# Patient Record
Sex: Male | Born: 1950 | Hispanic: No | Marital: Married | Smoking: Former smoker
Health system: Southern US, Community
[De-identification: ages and names within clinical notes are randomized; demographics above are authoritative.]

## PROBLEM LIST (undated history)

## (undated) DIAGNOSIS — R0609 Other forms of dyspnea: Secondary | ICD-10-CM

## (undated) DIAGNOSIS — J439 Emphysema, unspecified: Secondary | ICD-10-CM

## (undated) DIAGNOSIS — I10 Essential (primary) hypertension: Secondary | ICD-10-CM

## (undated) DIAGNOSIS — I350 Nonrheumatic aortic (valve) stenosis: Secondary | ICD-10-CM

## (undated) DIAGNOSIS — Z86010 Personal history of colonic polyps: Secondary | ICD-10-CM

## (undated) DIAGNOSIS — R001 Bradycardia, unspecified: Secondary | ICD-10-CM

## (undated) DIAGNOSIS — M5090 Cervical disc disorder, unspecified, unspecified cervical region: Secondary | ICD-10-CM

## (undated) DIAGNOSIS — R42 Dizziness and giddiness: Secondary | ICD-10-CM

## (undated) DIAGNOSIS — Z87891 Personal history of nicotine dependence: Secondary | ICD-10-CM

## (undated) DIAGNOSIS — I739 Peripheral vascular disease, unspecified: Secondary | ICD-10-CM

## (undated) DIAGNOSIS — G4733 Obstructive sleep apnea (adult) (pediatric): Secondary | ICD-10-CM

## (undated) DIAGNOSIS — I472 Ventricular tachycardia, unspecified: Secondary | ICD-10-CM

## (undated) DIAGNOSIS — I2089 Other forms of angina pectoris: Secondary | ICD-10-CM

## (undated) DIAGNOSIS — G453 Amaurosis fugax: Secondary | ICD-10-CM

## (undated) DIAGNOSIS — I2510 Atherosclerotic heart disease of native coronary artery without angina pectoris: Secondary | ICD-10-CM

## (undated) DIAGNOSIS — I700 Atherosclerosis of aorta: Secondary | ICD-10-CM

## (undated) DIAGNOSIS — R3129 Other microscopic hematuria: Secondary | ICD-10-CM

## (undated) DIAGNOSIS — R809 Proteinuria, unspecified: Secondary | ICD-10-CM

## (undated) DIAGNOSIS — L409 Psoriasis, unspecified: Secondary | ICD-10-CM

## (undated) DIAGNOSIS — E119 Type 2 diabetes mellitus without complications: Secondary | ICD-10-CM

## (undated) DIAGNOSIS — Z7982 Long term (current) use of aspirin: Secondary | ICD-10-CM

## (undated) DIAGNOSIS — N400 Enlarged prostate without lower urinary tract symptoms: Secondary | ICD-10-CM

## (undated) DIAGNOSIS — K219 Gastro-esophageal reflux disease without esophagitis: Secondary | ICD-10-CM

## (undated) DIAGNOSIS — M5136 Other intervertebral disc degeneration, lumbar region: Secondary | ICD-10-CM

## (undated) DIAGNOSIS — D126 Benign neoplasm of colon, unspecified: Secondary | ICD-10-CM

## (undated) DIAGNOSIS — N83201 Unspecified ovarian cyst, right side: Secondary | ICD-10-CM

## (undated) DIAGNOSIS — E785 Hyperlipidemia, unspecified: Secondary | ICD-10-CM

## (undated) DIAGNOSIS — M51369 Other intervertebral disc degeneration, lumbar region without mention of lumbar back pain or lower extremity pain: Secondary | ICD-10-CM

## (undated) DIAGNOSIS — M1990 Unspecified osteoarthritis, unspecified site: Secondary | ICD-10-CM

## (undated) DIAGNOSIS — K649 Unspecified hemorrhoids: Secondary | ICD-10-CM

## (undated) DIAGNOSIS — R0600 Dyspnea, unspecified: Secondary | ICD-10-CM

## (undated) DIAGNOSIS — G4730 Sleep apnea, unspecified: Secondary | ICD-10-CM

## (undated) DIAGNOSIS — I493 Ventricular premature depolarization: Secondary | ICD-10-CM

## (undated) DIAGNOSIS — D649 Anemia, unspecified: Secondary | ICD-10-CM

## (undated) DIAGNOSIS — J449 Chronic obstructive pulmonary disease, unspecified: Secondary | ICD-10-CM

## (undated) HISTORY — PX: JOINT REPLACEMENT: SHX530

## (undated) HISTORY — PX: TOOTH EXTRACTION: SUR596

## (undated) HISTORY — PX: BACK SURGERY: SHX140

## (undated) HISTORY — DX: Nonrheumatic aortic (valve) stenosis: I35.0

## (undated) HISTORY — DX: Ventricular tachycardia, unspecified: I47.20

---

## 2005-09-13 ENCOUNTER — Ambulatory Visit: Payer: Self-pay | Admitting: Internal Medicine

## 2005-09-13 IMAGING — CT CT ABDOMEN AND PELVIS WITHOUT AND WITH CONTRAST
3 of 5 series · 12 of 32 positions shown, 17 images · non-contrast
Comparison: none

REASON FOR EXAM: Hematuria  Allergic to Shellfish office is medicating
COMMENTS:

[Series 2: without · axial · non-contrast · 0.83mm/px · z∈[-929,-585]mm · 6 of 61 slices shown, 11 images]
[im 9/61  soft-tissue]
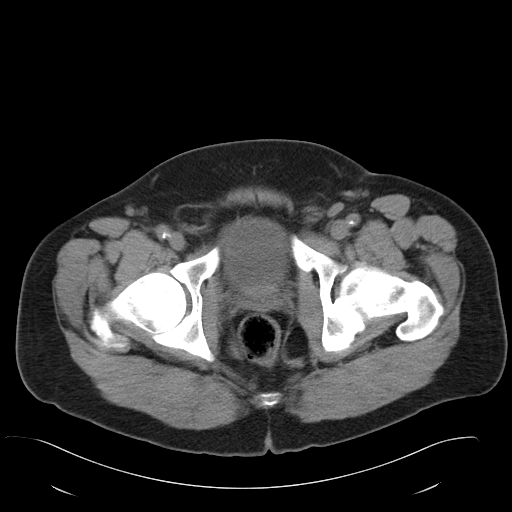
[im 9/61  bone]
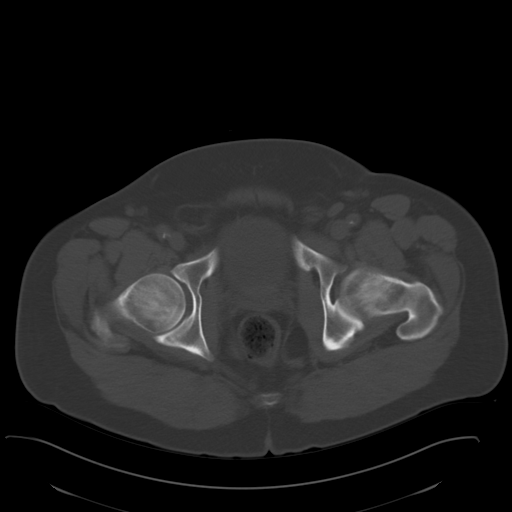
[im 18/61  soft-tissue]
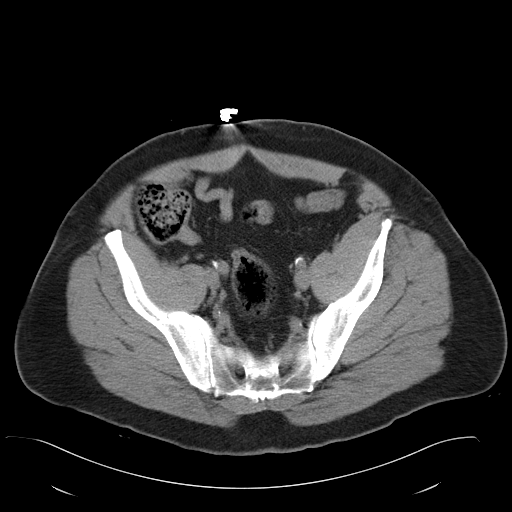
[im 26/61  soft-tissue]
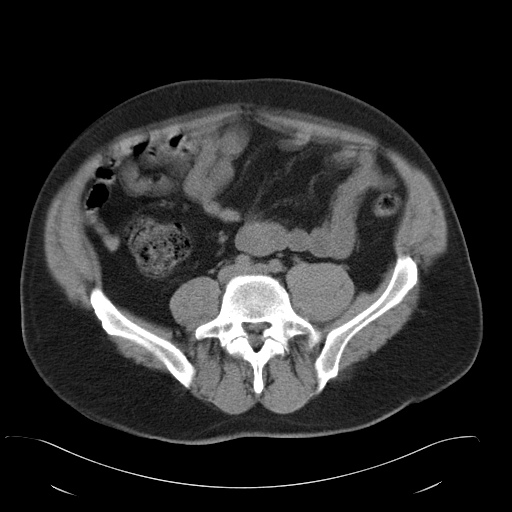
[im 26/61  lung]
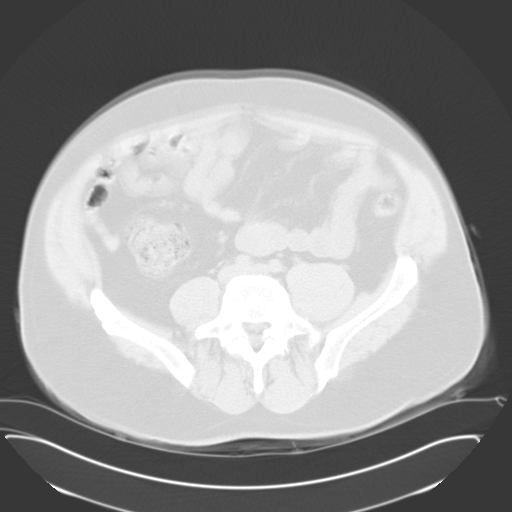
[im 35/61  soft-tissue]
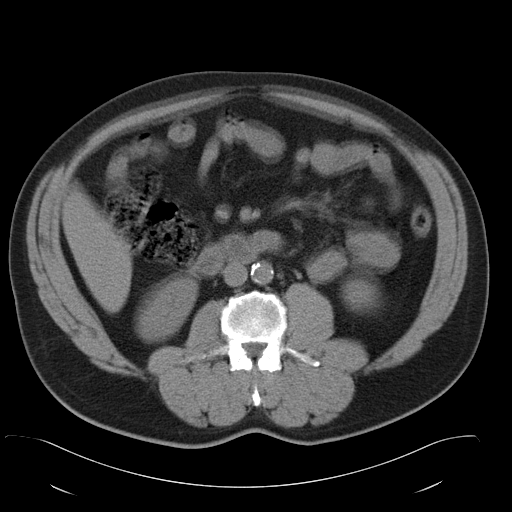
[im 35/61  lung]
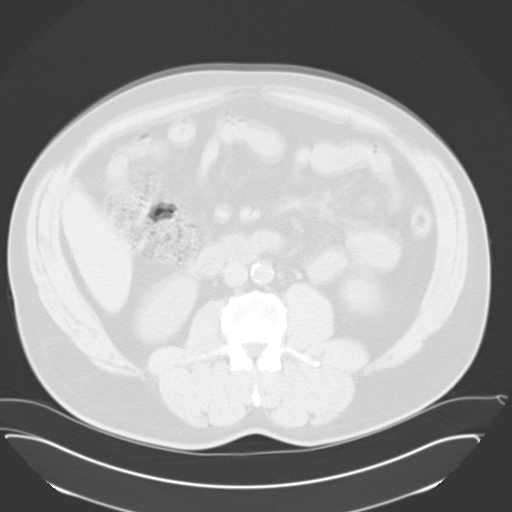
[im 43/61  soft-tissue]
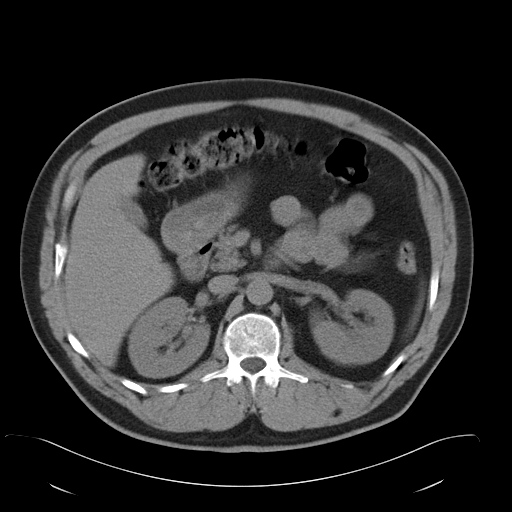
[im 43/61  lung]
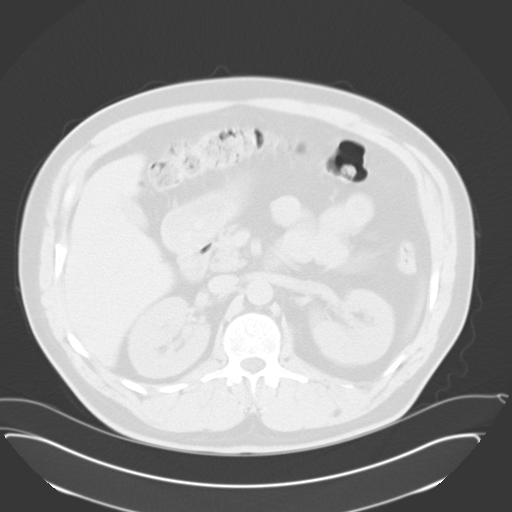
[im 52/61  soft-tissue]
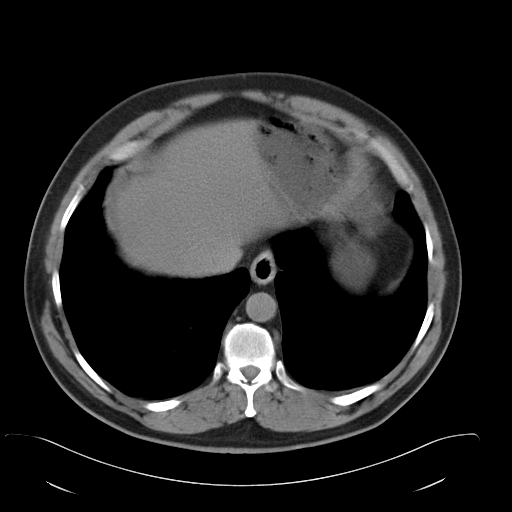
[im 52/61  lung]
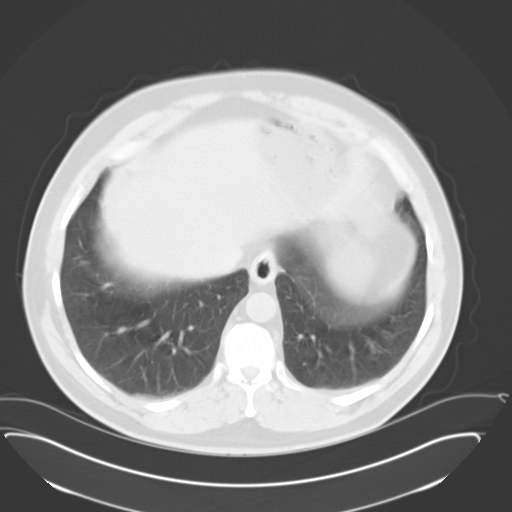

[Series 3: with · axial · 0.83mm/px · z∈[-913,-593]mm · 5 of 61 slices shown]
[im 11/61  soft-tissue]
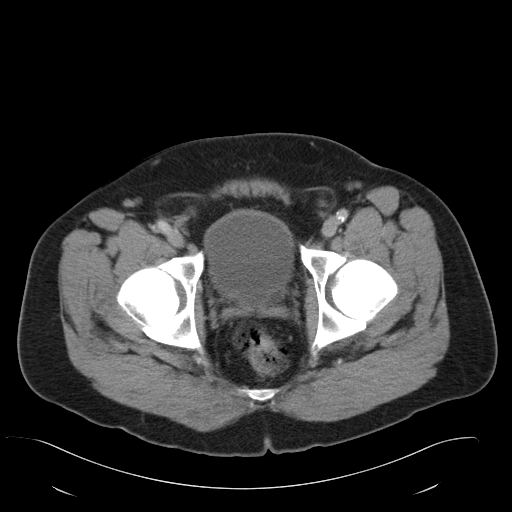
[im 21/61  soft-tissue]
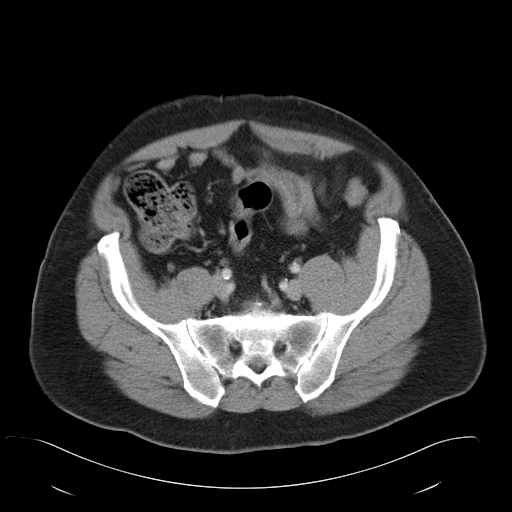
[im 31/61  soft-tissue]
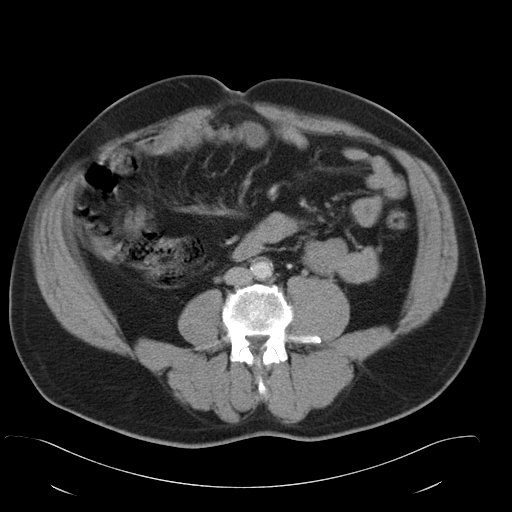
[im 41/61  soft-tissue]
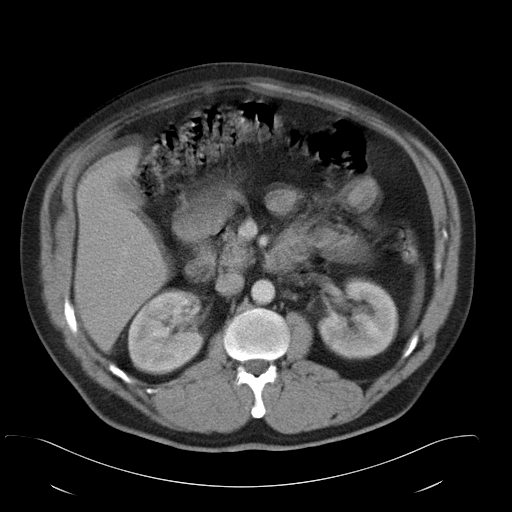
[im 51/61  soft-tissue]
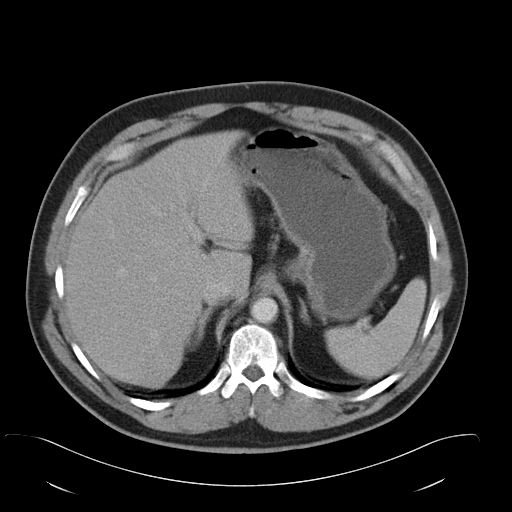

[Series 5: with repeat · axial · 0.83mm/px · 1 of 61 slices shown]
[im 11/61  soft-tissue]
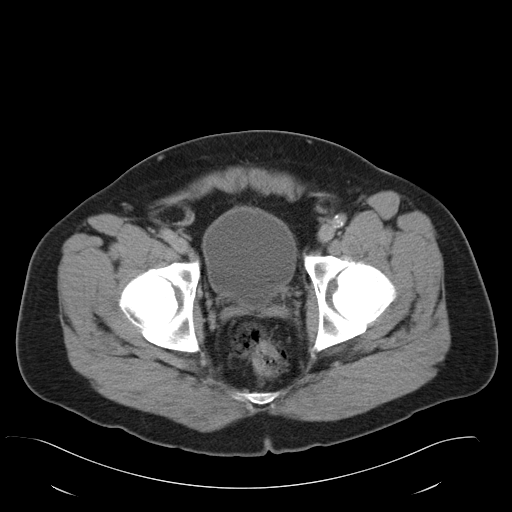

[12 of 32 positions shown; findings below may reference images not displayed]

PROCEDURE:     CT  - CT ABDOMEN / PELVIS  W/WO  - [DATE]  [DATE]

RESULT:       The patient has unexplained hematuria.  The patient underwent
a premedication protocol for this study.  The patient is also noting history
of pelvic discomfort.

On the precontrast images, the kidneys exhibit normal density and contour.
I do not see evidence of collecting system stones.  A punctate calcification
in the LEFT midpole is seen on image 18.  Following intravenous
administration of 100 ml of [IF] the kidneys enhance well.  There is a
cortically based hypodensity involving the mid to lower pole of the LEFT
kidney anteriorly.  It measures less than 1.0 cm in greatest dimension and
has Hounsfield measurements of approximately 16 on the precontrast images
following to 1 on the immediate postcontrast images and rising to 7 on
delayed images. This is most compatible with a benign cyst.  On the LEFT
there is a hypodensity in the lower pole measuring approximately 8 mm in
diameter with Hounsfield measurement of approximately 14 precontrast, rising
to 23 postcontrast and rising further to approximately 50 on the delayed
images.

The study is limited due to considerable motion artifact and repeat images
were obtained.  The liver, stomach, spleen, gallbladder, adrenal glands and
pancreas are normal in appearance. The spleen is not enlarged.  The
paraaortic and pericaval regions are normal in appearance.  The caliber of
the abdominal aorta is normal.  There is some calcification within its wall.
 The unopacified loops of small and large bowel are normal in appearance.
The urinary bladder is partially distended and is grossly normal.  There is
mild increase in the size of the prostate gland producing an impression upon
the urinary bladder base.  There are small bilateral inguinal hernias
containing peritoneal fat. No bowel is seen within them. There is no free
fluid in the pelvis.  The lung bases exhibit no acute abnormality.
IMPRESSION: 1.     There are findings in the RIGHT kidney most compatible with a benign
simple cyst in the mid to lower pole anteriorly.
2.     On the LEFT in the lower pole of the kidney there is an indeterminate
8 mm diameter focus of decreased density with Hounsfield measurements that
do not fit those classic for a simple cyst.  Further evaluation of the LEFT
kidney with attention to the lower pole is recommended using ultrasound.
3.     I do not see evidence of acute hepatobiliary disease.
4.     There is mild enlargement of the prostate gland.  The urinary bladder
is grossly normal.  On the delayed images there is fractional visualization
of both ureters.

Thank you for this opportunity to contribute to the care of your patient.

Addendum [DATE]:  In the body of the report there was a statement
indicating there was a cortically based hypodensity involving the mid to
lower pole of the LEFT kidney anteriorly.  In fact this should have stated
RIGHT kidney.  In the impression of this dictation, however, the correct
side was indicated.  The finding spoken of here is indeed on the RIGHT.

## 2005-10-11 ENCOUNTER — Ambulatory Visit: Payer: Self-pay | Admitting: Urology

## 2005-10-11 IMAGING — US US RENAL KIDNEY
1 series · 17 of 25 positions shown · non-contrast
Comparison: none

REASON FOR EXAM: Left indeterminate 8.0 mm density as seen on CT of
[DATE]
COMMENTS:

[Series 1: us renal kidney · 17 of 54 slices shown]
[im 1/54]
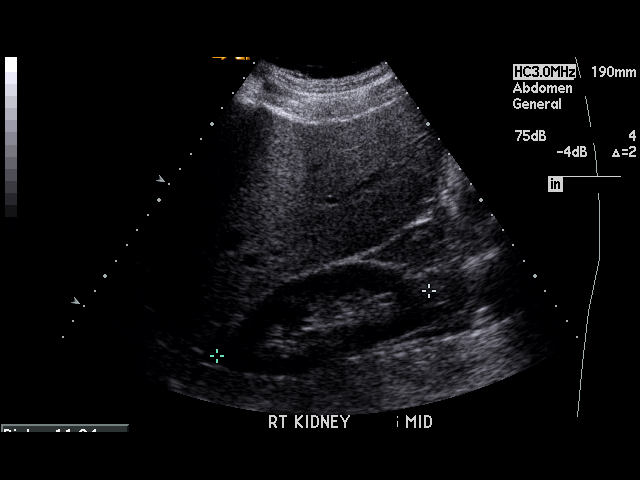
[im 5/54]
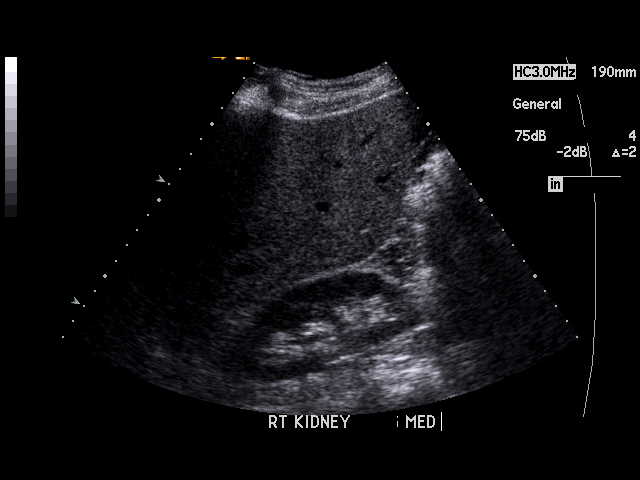
[im 7/54]
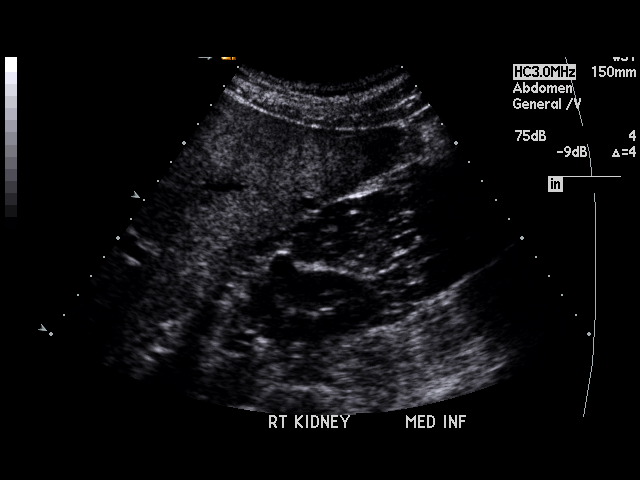
[im 12/54]
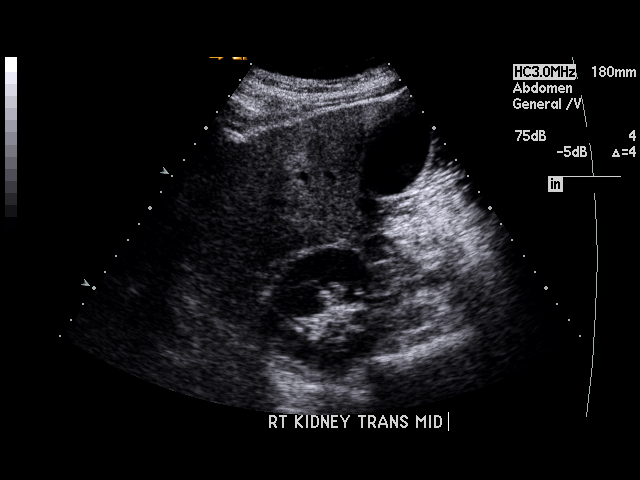
[im 14/54]
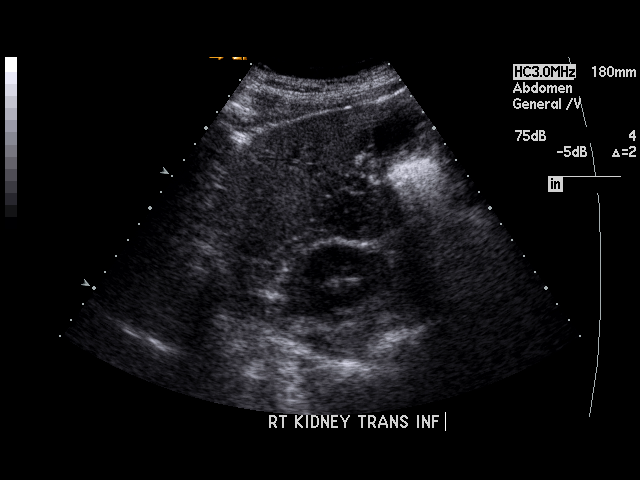
[im 18/54]
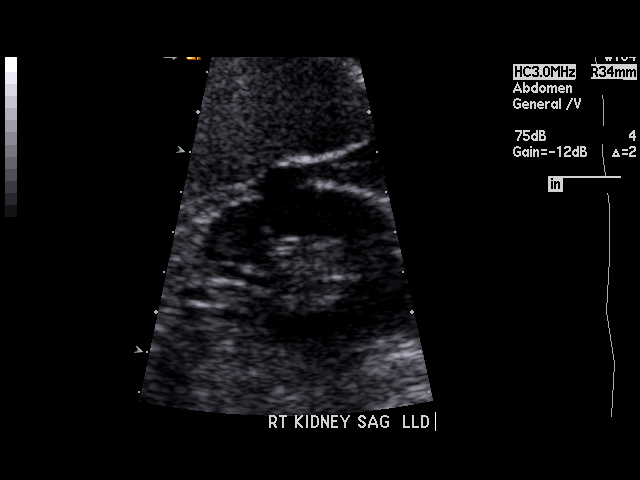
[im 20/54]
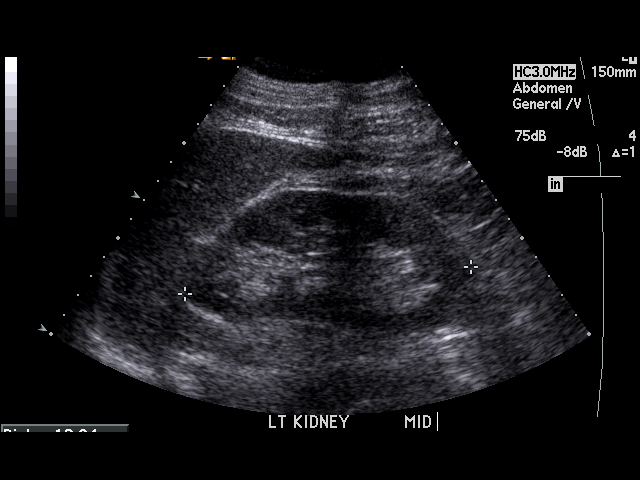
[im 25/54]
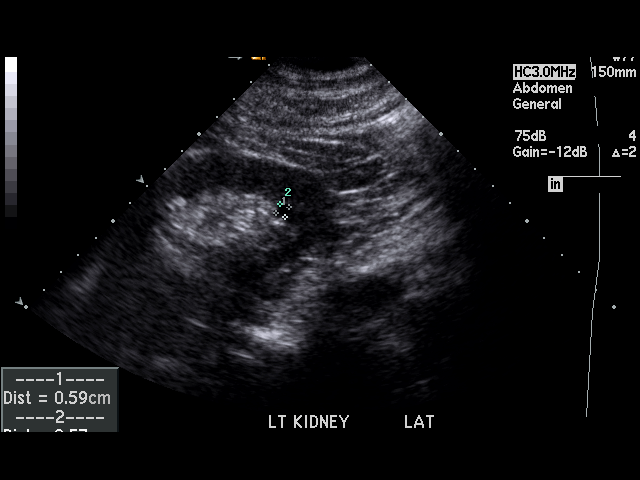
[im 27/54]
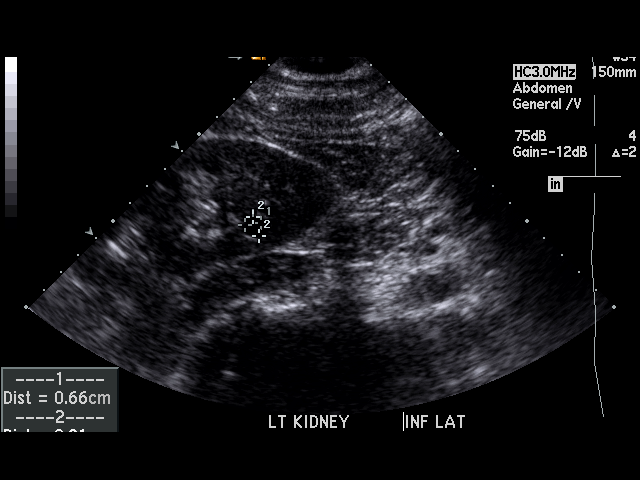
[im 29/54]
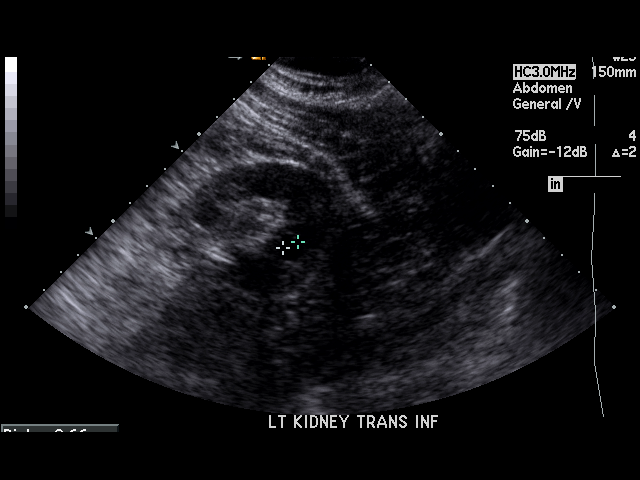
[im 34/54]
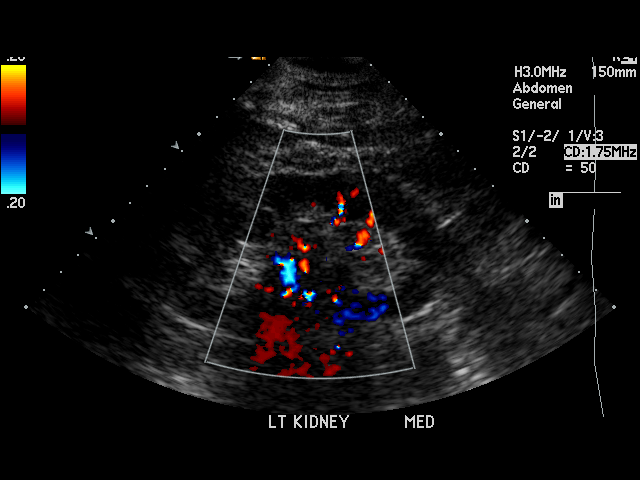
[im 36/54]
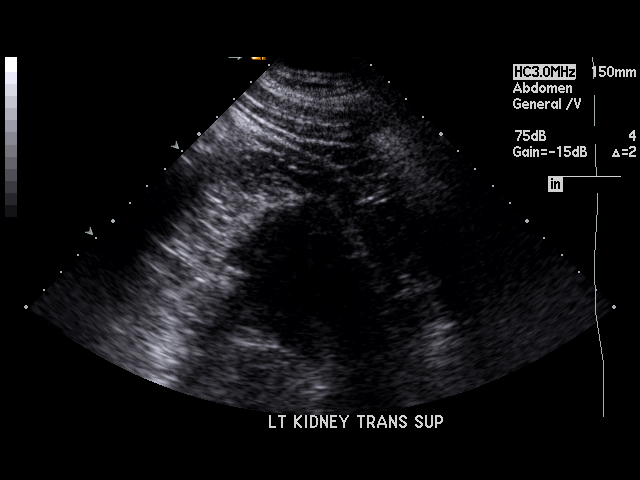
[im 40/54]
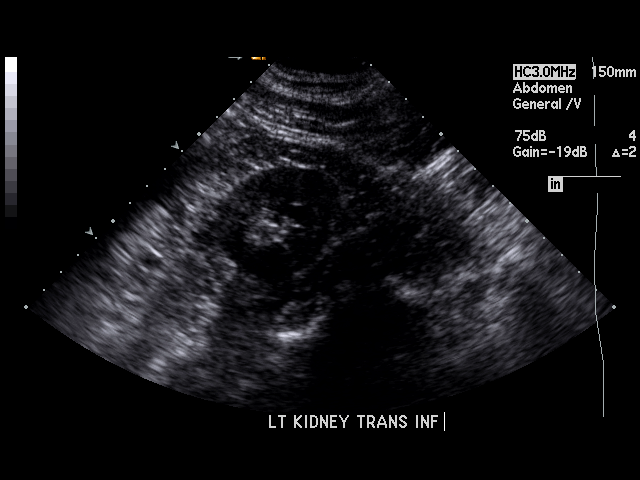
[im 42/54]
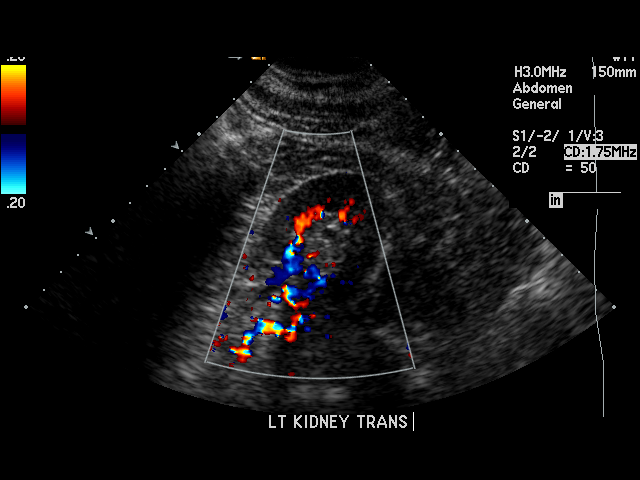
[im 47/54]
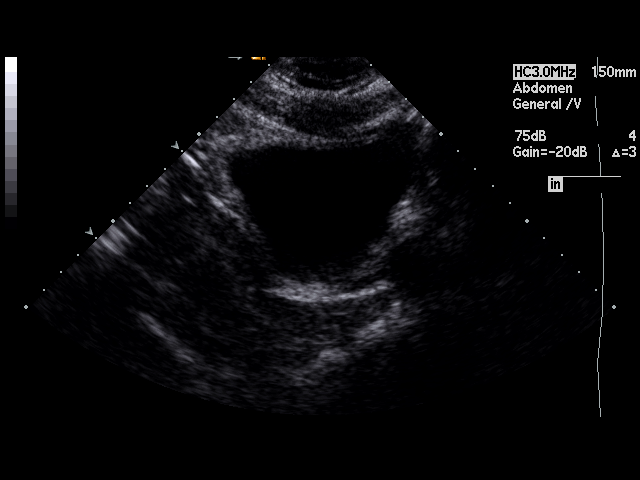
[im 49/54]
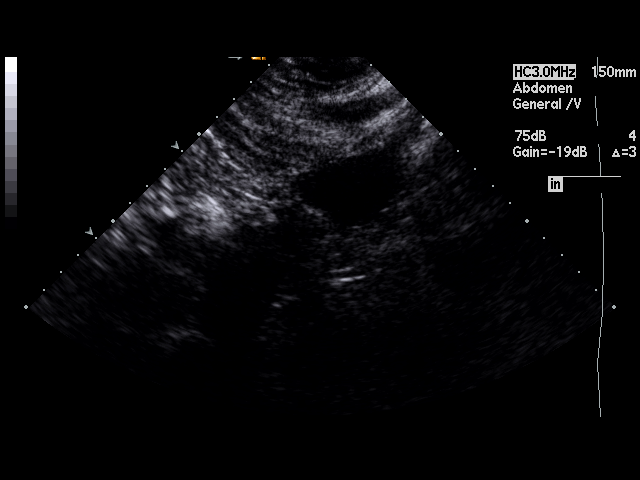
[im 54/54]
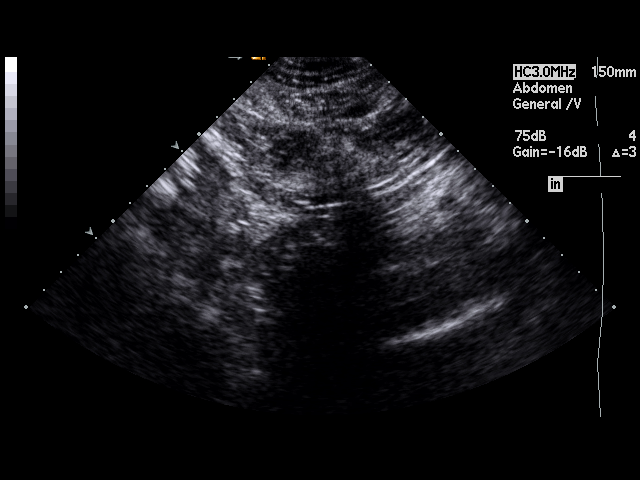

[17 of 25 positions shown; findings below may reference images not displayed]

PROCEDURE:     US  - US KIDNEY BILATERAL  - [DATE] [DATE]

RESULT:     Sonographic evaluation of the kidneys is performed. The patient
has a previous CT but no prior ultrasound examinations for comparison. The
CT suggested a RIGHT renal cyst. On today's study, in the mid to lower pole
RIGHT renal region, there is a somewhat exophytic, hypoechoic area which
measures approximately 9.0 x 8.0 x 9.5 mm. This is hypoechoic but does not
show definite through transmission. The Hounsfield readings on this
suggested that it was cystic but continued follow-up is recommended to
exclude the possibility of a developing solid, hypoechoic mass.

The tiny, low density area in the lower pole of the LEFT kidney, which
appeared to enhance on the delayed images on the CT, is evaluated. There
actually appear to be two, hypoechoic areas in the lower pole of the LEFT
kidney on ultrasound. One of these areas measures 5.9 x 5.7 x 5.0 mm and the
second is inferolateral measuring 6.6 x 8.1 6.6 mm and does show through
transmission. The kidneys otherwise appear to be grossly normal with the
RIGHT kidney measuring 11.9 x 5.2 x 5.6 cm. The LEFT kidney measures 12.3 x
6.6 x 4.9 cm. There is urine in the bladder.
IMPRESSION: 1.     Indeterminate hypoechoic lesion in the RIGHT kidney. This may
represent a cyst without through transmission or a hypoechoic solid lesion.
2.     Tiny lesion in the lower pole of the LEFT kidney is too small for
characterization. The second appears to show through transmission suggestive
of a cyst. Continued follow-up is recommended to document stability.

## 2006-07-25 ENCOUNTER — Ambulatory Visit: Payer: Self-pay | Admitting: Gastroenterology

## 2007-02-27 ENCOUNTER — Ambulatory Visit: Payer: Self-pay | Admitting: Urology

## 2007-03-13 ENCOUNTER — Ambulatory Visit: Payer: Self-pay | Admitting: Urology

## 2007-03-13 IMAGING — CT CT ABDOMEN WO/W CM
2 of 4 series · 14 of 32 positions shown, 19 images · non-contrast
Comparison: none

REASON FOR EXAM: triphasic   renal lesion   shellfish allergy  on
Metformin  Dr to instruct
COMMENTS:

PROCEDURE:     CT  - CT ABDOMEN STANDARD W/WO  - [DATE] [DATE]
RESULT:
HISTORY: Renal lesion.

[Series 2: wo · axial · 0.87mm/px · z∈[-256,-46]mm · 7 of 57 slices shown, 12 images]
[im 8/57  soft-tissue]
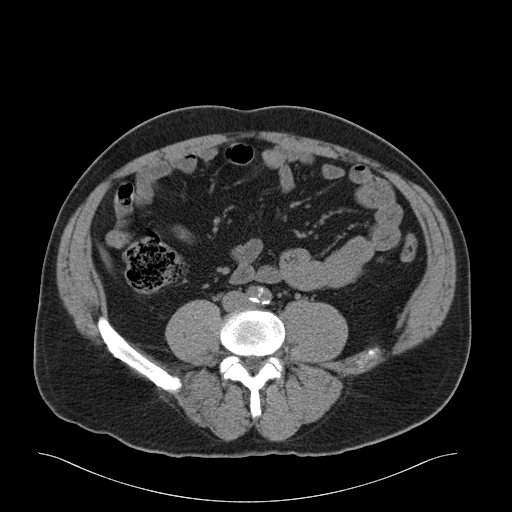
[im 8/57  bone]
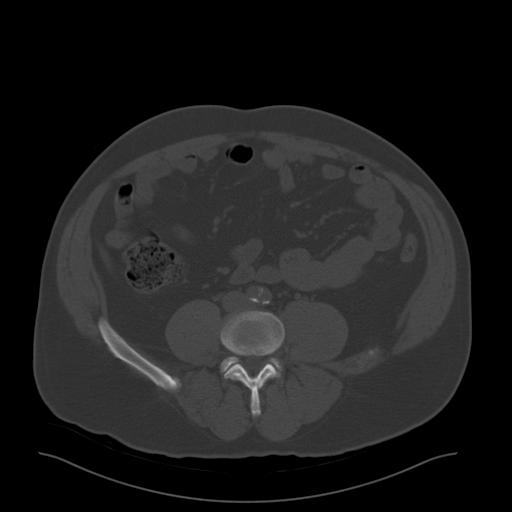
[im 15/57  soft-tissue]
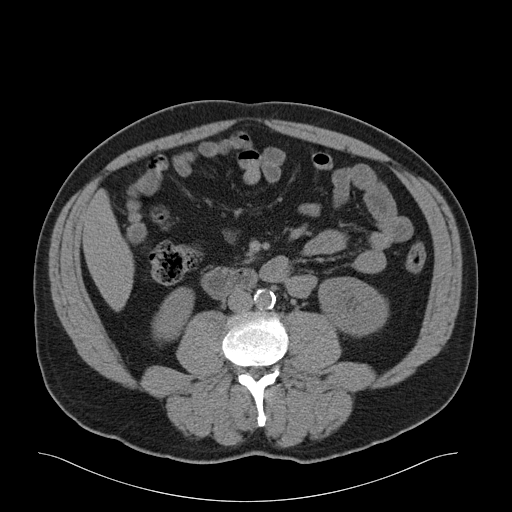
[im 22/57  soft-tissue]
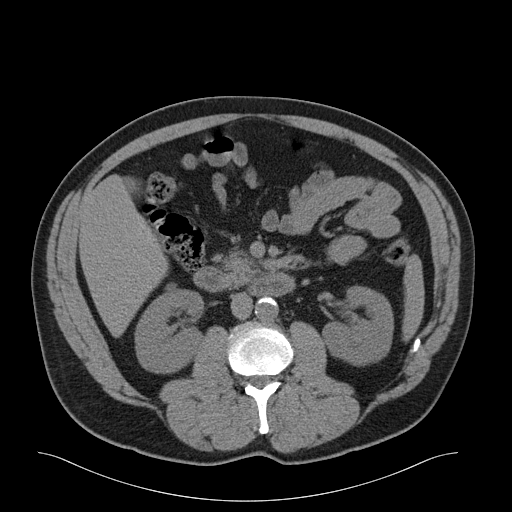
[im 29/57  soft-tissue]
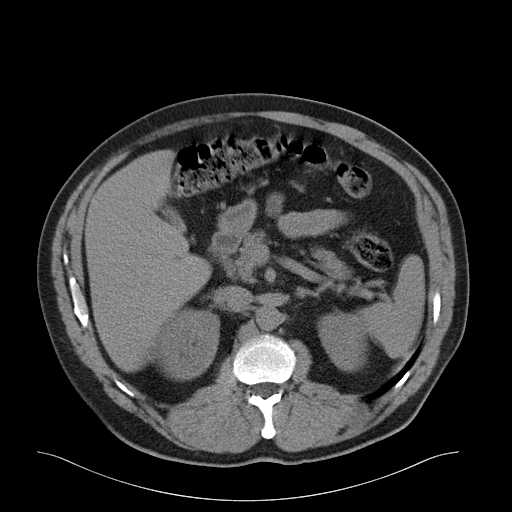
[im 29/57  lung]
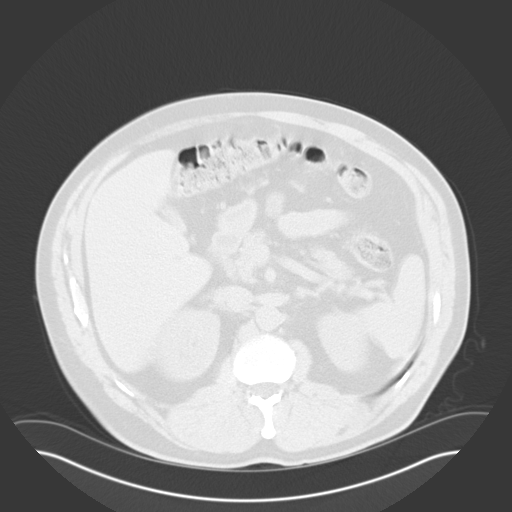
[im 36/57  soft-tissue]
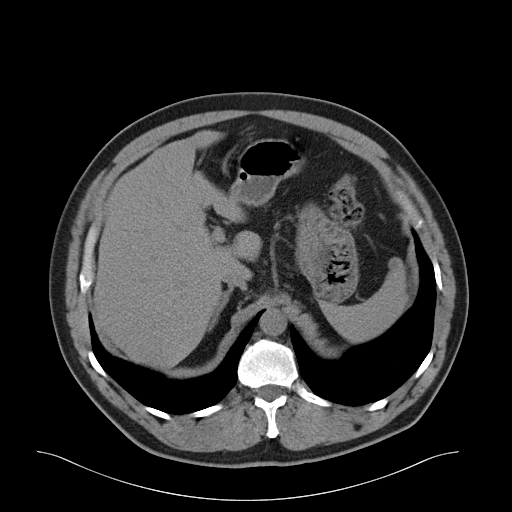
[im 36/57  lung]
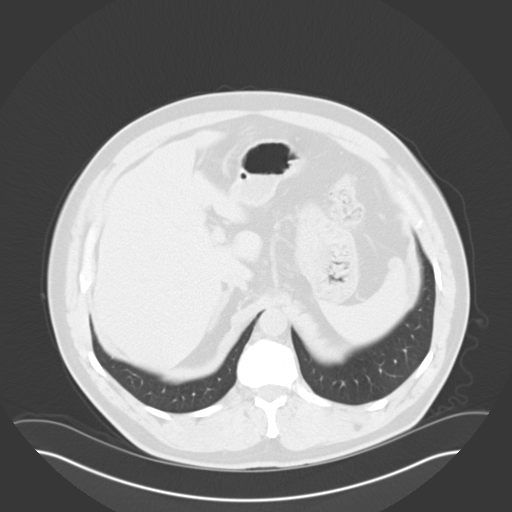
[im 43/57  soft-tissue]
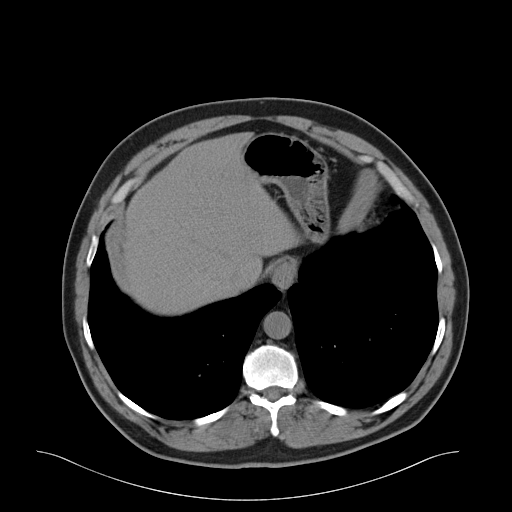
[im 43/57  lung]
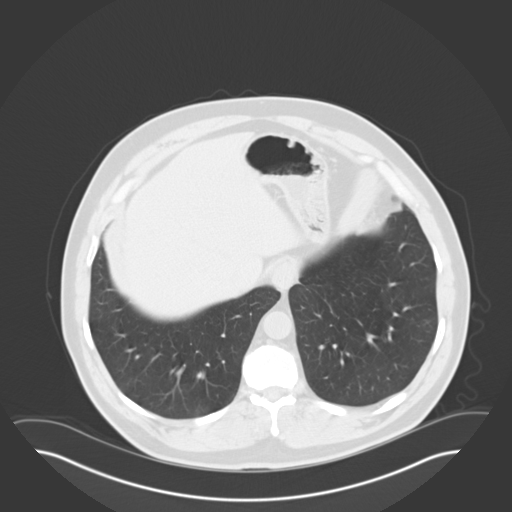
[im 50/57  soft-tissue]
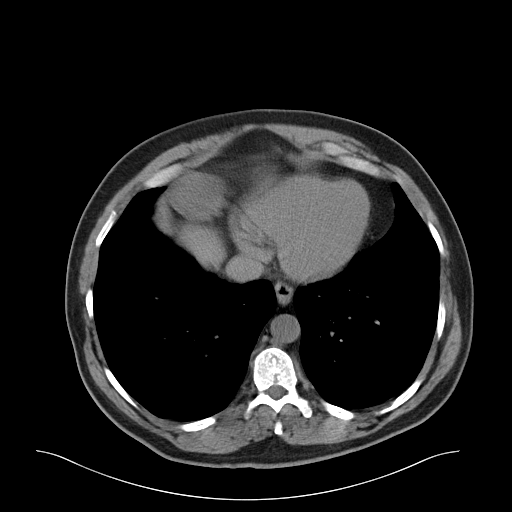
[im 50/57  lung]
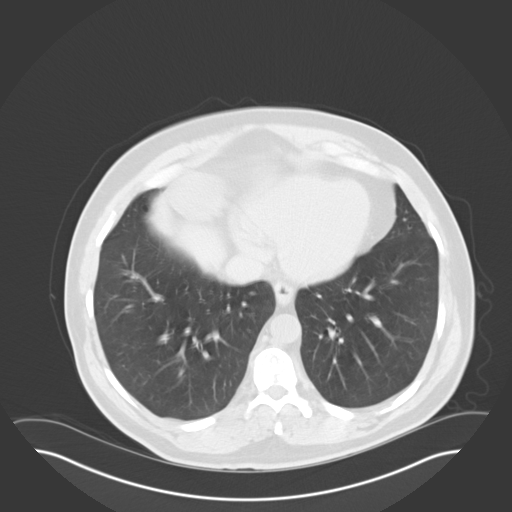

[Series 3: with · axial · 0.87mm/px · z∈[-256,-46]mm · 7 of 57 slices shown]
[im 8/57  soft-tissue]
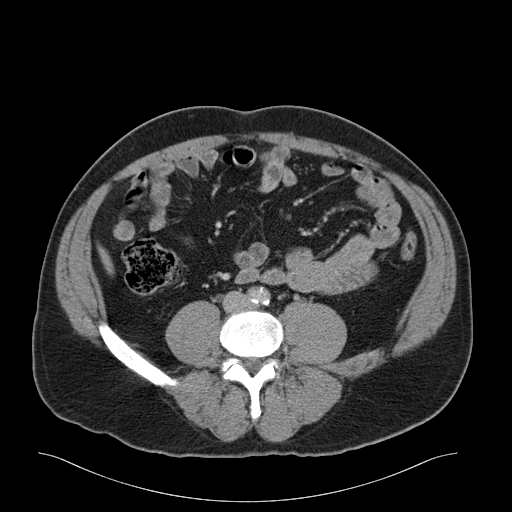
[im 15/57  soft-tissue]
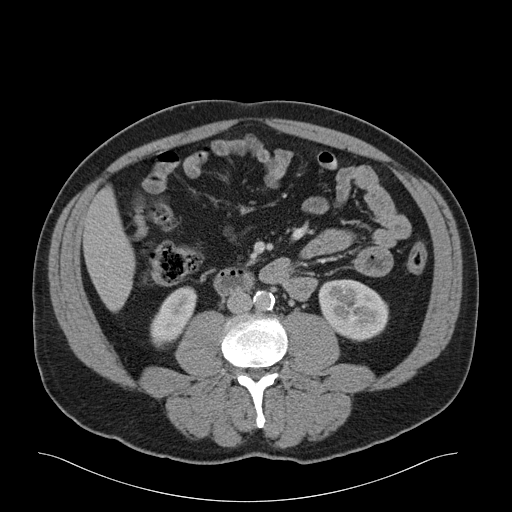
[im 22/57  soft-tissue]
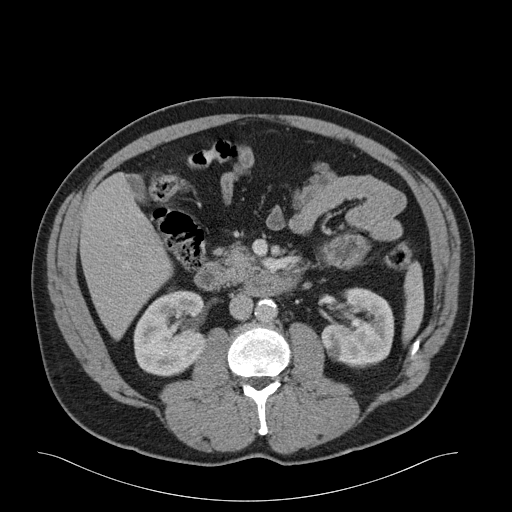
[im 29/57  soft-tissue]
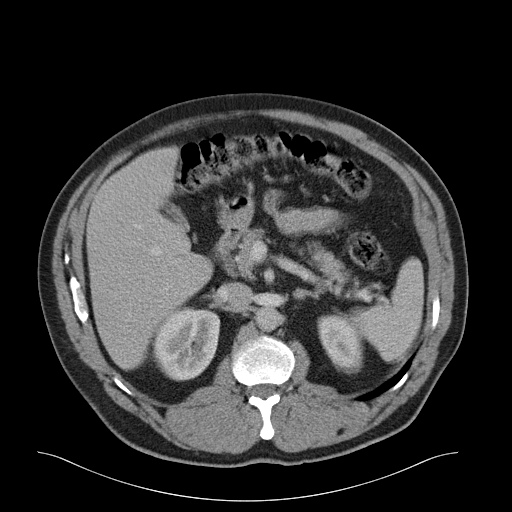
[im 36/57  soft-tissue]
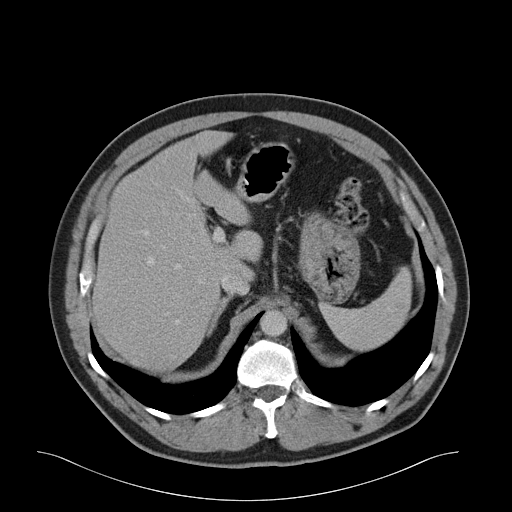
[im 43/57  soft-tissue]
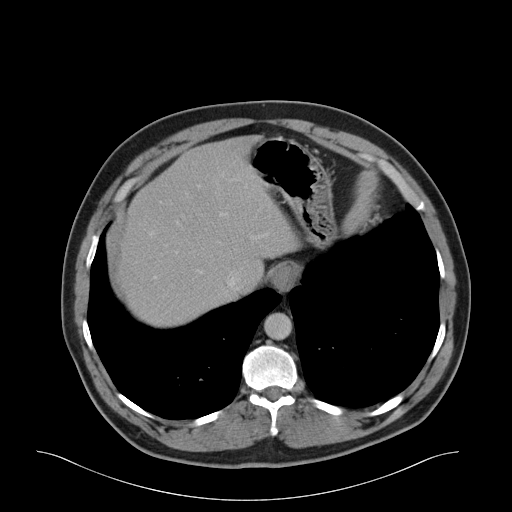
[im 50/57  soft-tissue]
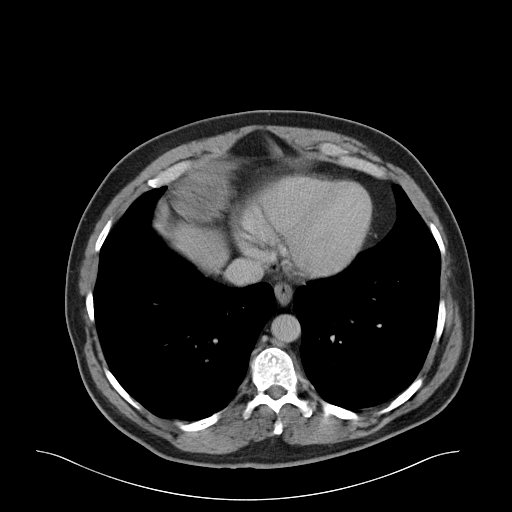

[14 of 32 positions shown; findings below may reference images not displayed]

COMPARISON STUDIES: Prior CT of the abdomen dated [DATE].

PROCEDURE AND FINDINGS: Standard triphasic CT of the abdomen was obtained.
The previously identified low density lesions in the LEFT kidney on today's
exam are demonstrated to represent simple cysts.  All Hounsfield units are
less than 20 even on contrast enhanced studies. A simple cyst is noted in
the RIGHT kidney. No further evaluation is now needed given stability over
one year and the fact that the lesions appear to represent simple cysts. The
liver and spleen is normal. The pancreas is normal. The adrenals are normal.
No focal renal abnormalities otherwise are noted. There is no hydronephrosis
or hydroureter. No bowel distention is noted.
IMPRESSION: 1)Stable exam. Previously identified lesions in both kidneys are now noted
to represent simple cysts as all lesions have Hounsfield units less than 20.
 The lesions do not appear to have changed from prior study. No further
evaluation of the kidneys in regard to the renal lesions is needed.

## 2008-06-10 ENCOUNTER — Inpatient Hospital Stay: Payer: Self-pay | Admitting: Internal Medicine

## 2008-06-10 IMAGING — CR DG CHEST 1V PORT
1 series · 1 of 1 positions shown · non-contrast
Comparison: none

REASON FOR EXAM: fever and hypoxia
COMMENTS:

PROCEDURE:     DXR - DXR PORTABLE CHEST SINGLE VIEW  - [DATE]  [DATE]
RESULT:     The lungs are clear of acute infiltrates. The cardiovascular
structures are unremarkable.

[view not recorded]
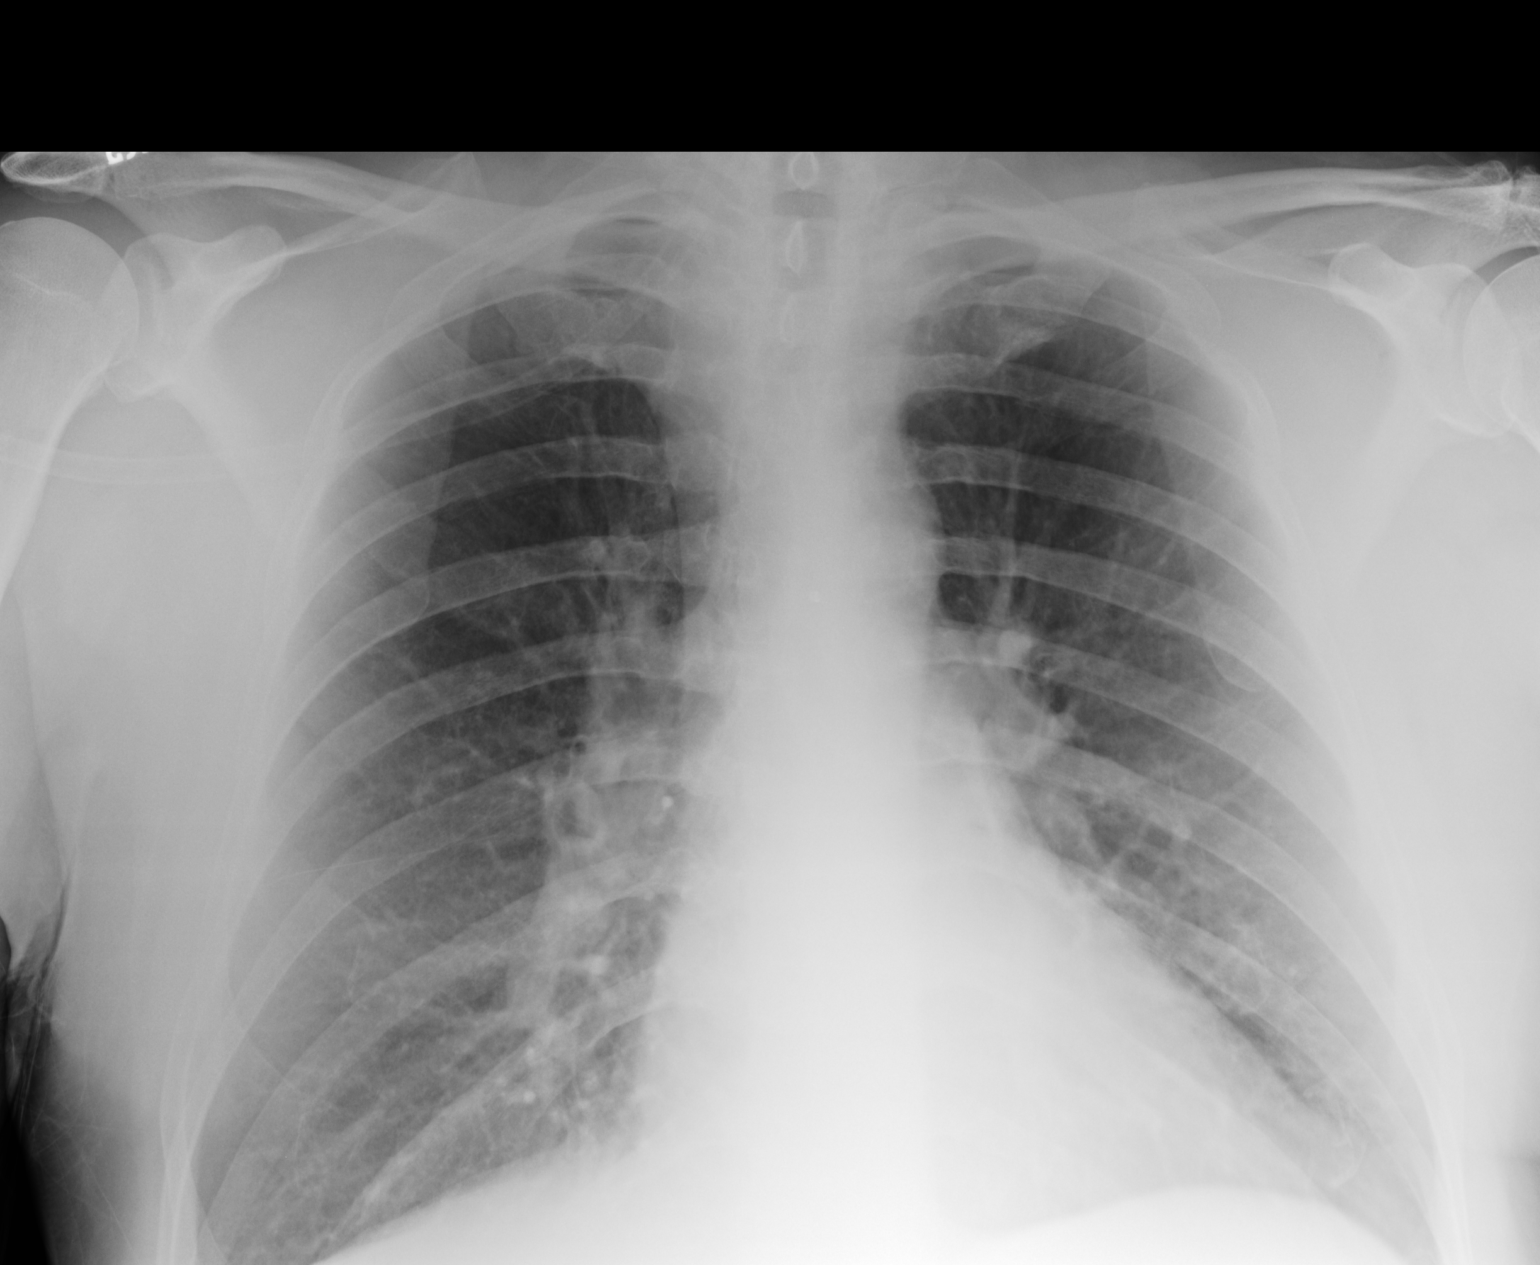

[1 of 1 positions shown; findings below may reference images not displayed]

IMPRESSION: No acute cardiopulmonary disease.

## 2008-06-11 ENCOUNTER — Ambulatory Visit: Payer: Self-pay | Admitting: Cardiology

## 2008-06-11 IMAGING — CR DG CHEST 2V
1 series · 4 of 4 positions shown · non-contrast
Comparison: none

REASON FOR EXAM: sob
COMMENTS:

[Series 1: view not recorded · 0.17mm/px · 4 of 4 slices shown]
[im 1/4]
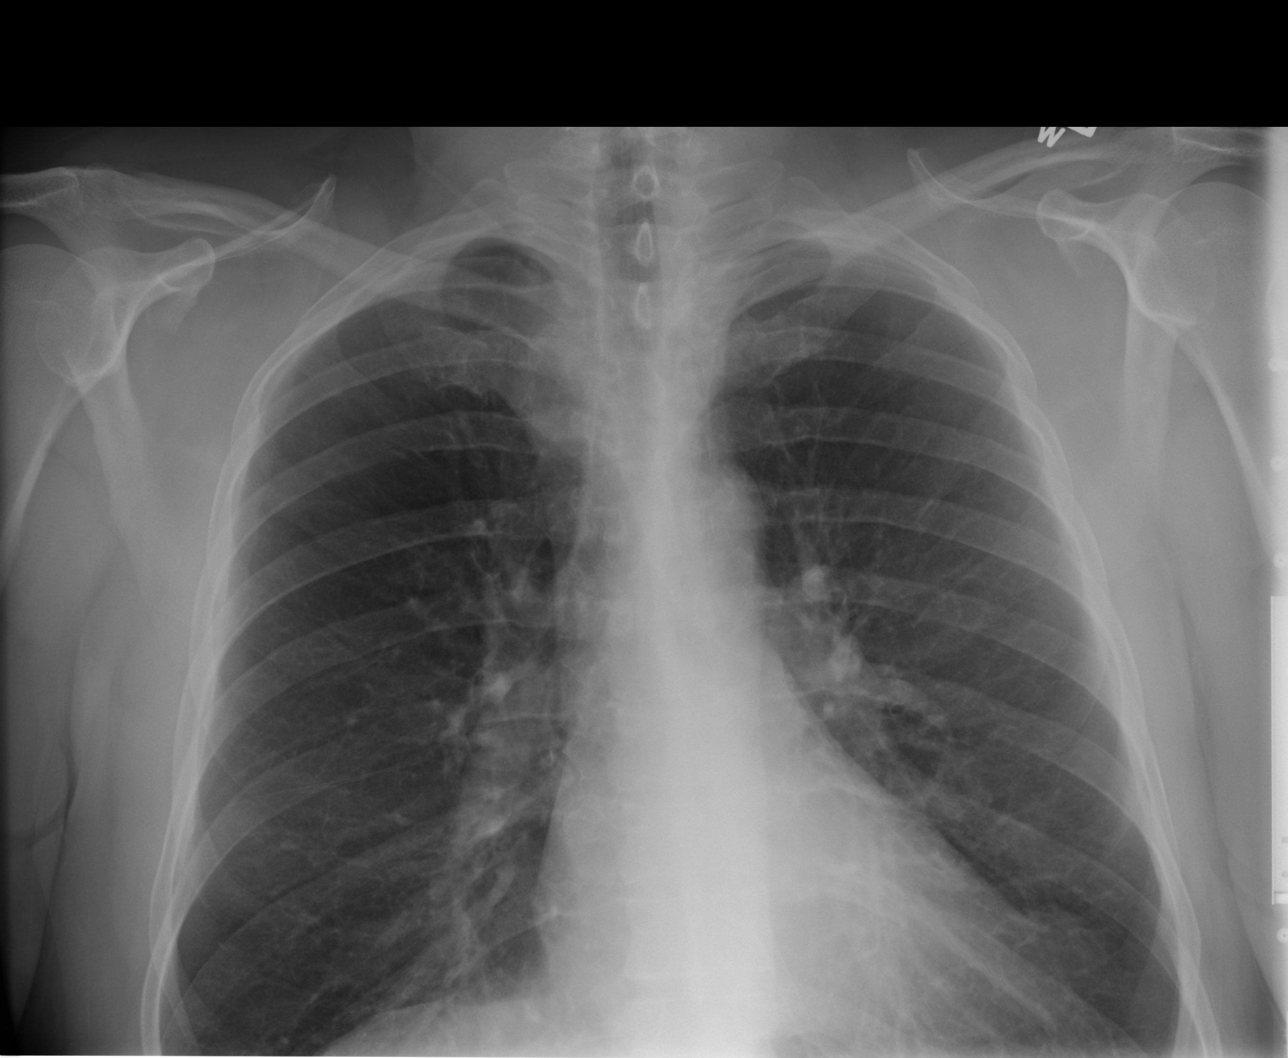
[im 2/4]
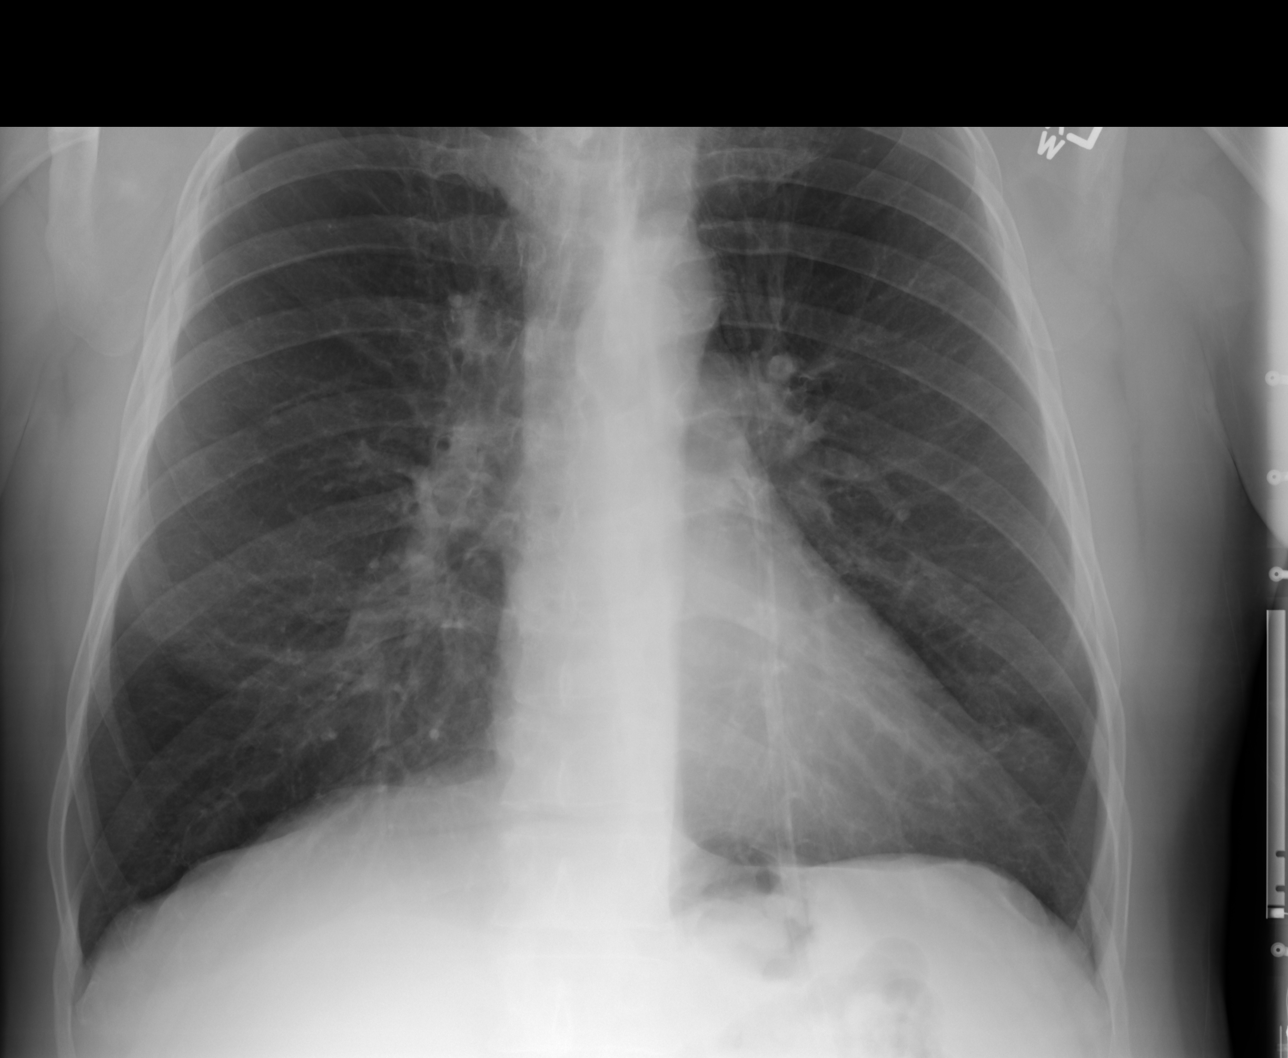
[im 3/4]
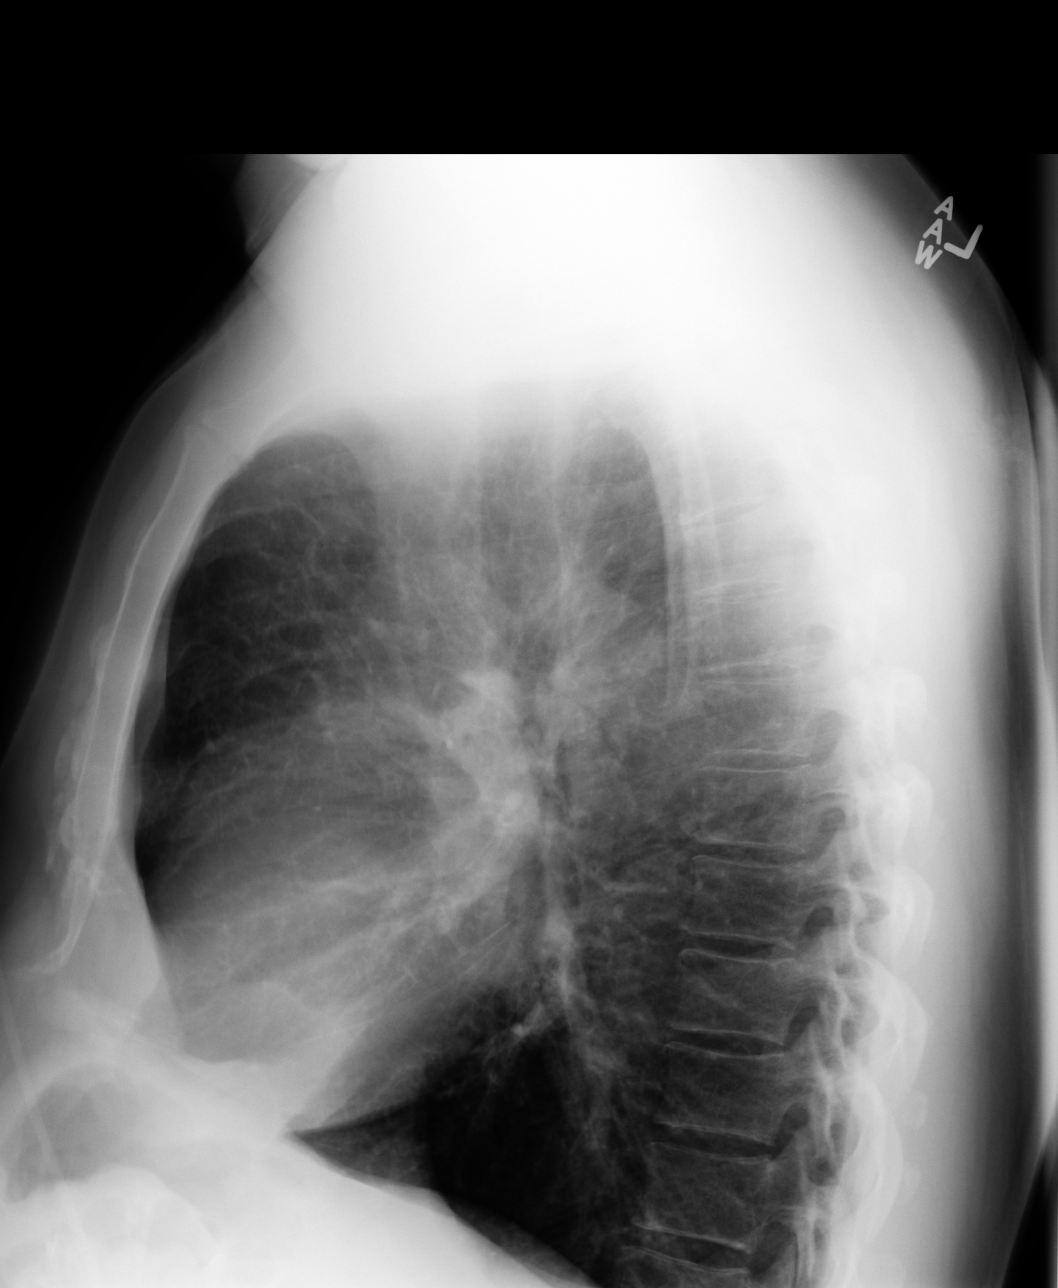
[im 4/4]
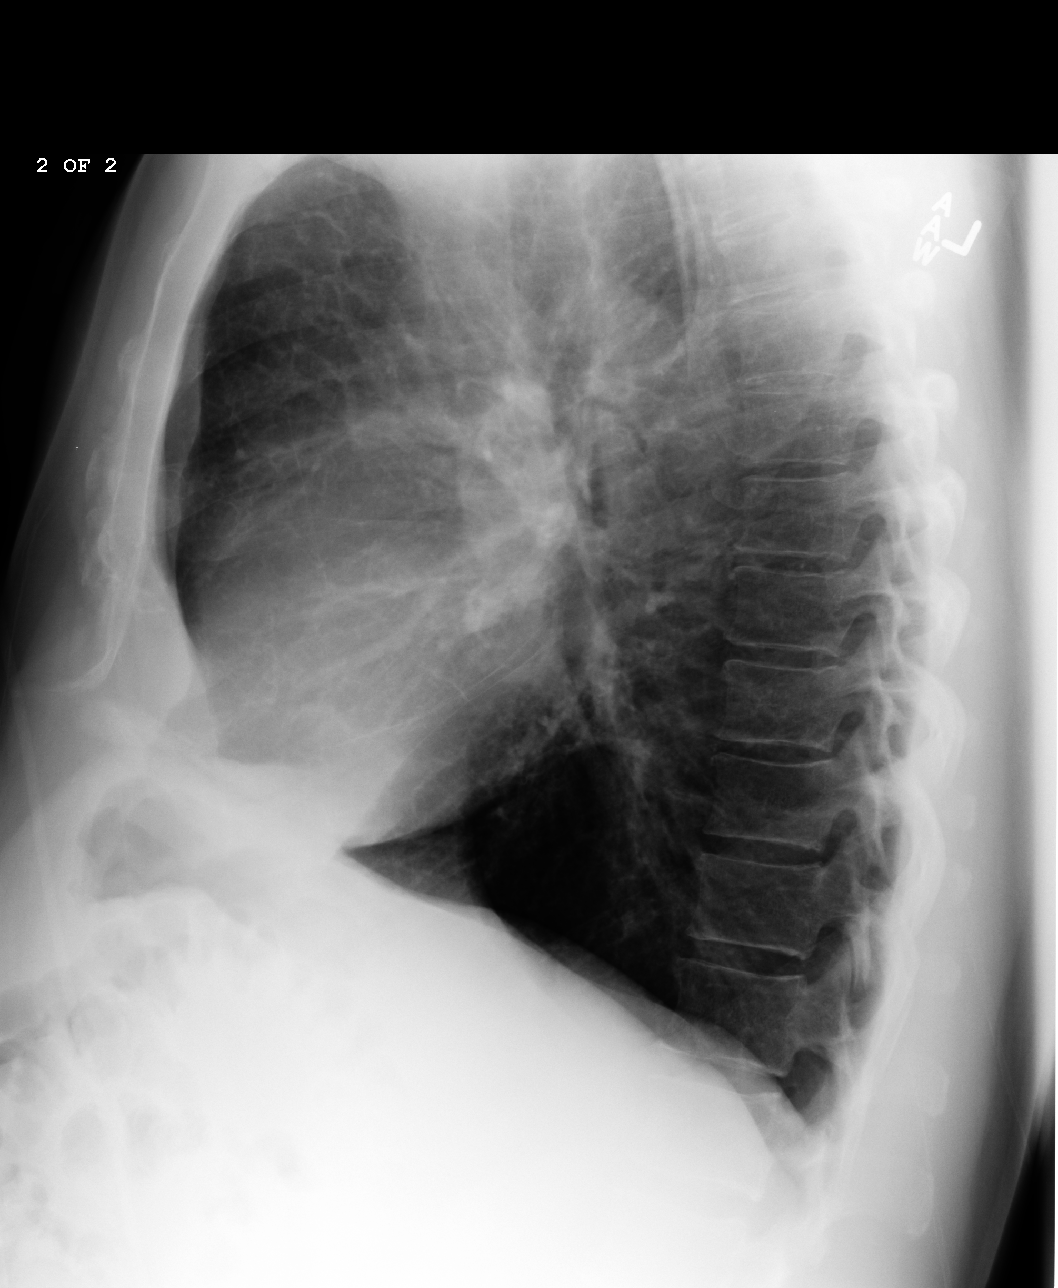

[4 of 4 positions shown; findings below may reference images not displayed]

PROCEDURE:     DXR - DXR CHEST PA (OR AP) AND LATERAL  - [DATE]  [DATE]

RESULT:     Comparison is made to the prior exam of [DATE]. The lung
fields are clear. No pneumonia, pneumothorax or pleural effusion is seen.
The heart size is normal. The chest is hyperexpanded consistent with history
of COPD or asthma.
IMPRESSION: 1. The lung fields are clear.
2. The chest is hyperexpanded compatible with a history of COPD or asthma.

## 2009-08-28 ENCOUNTER — Ambulatory Visit: Payer: Self-pay | Admitting: Gastroenterology

## 2010-10-05 ENCOUNTER — Ambulatory Visit: Payer: Self-pay | Admitting: Internal Medicine

## 2010-10-05 IMAGING — CT CT CHEST W/ CM
1 of 2 series · 15 of 33 positions shown, 19 images · IV contrast (agent unspecified)
Comparison: none

REASON FOR EXAM: dyspnea atelectasis abn CXR  pt takes metformin  pt is
allegic to shellfish ...
COMMENTS:

PROCEDURE:     CT  - CT CHEST WITH CONTRAST  - [DATE]  [DATE]
RESULT:
TECHNIQUE: Helical 5 mm sections were obtained from the thoracic inlet
through the lung bases status post intravenous administration of 75 ml
[FU].

[Series 4: inspace · axial · 0.76mm/px · z∈[+234,+571]mm · 15 of 371 slices shown, 19 images]
[im 17/371  mediastinal]
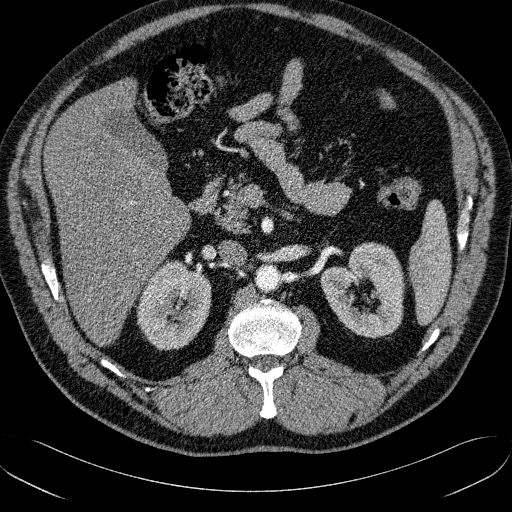
[im 17/371  lung]
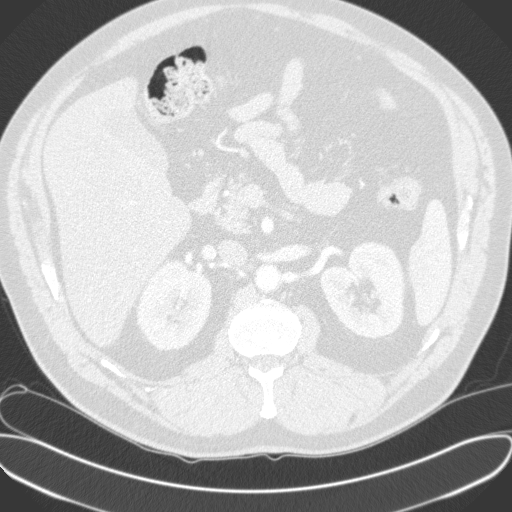
[im 51/371  lung]
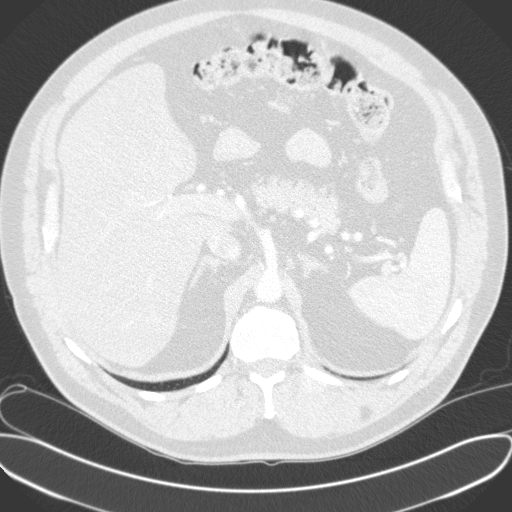
[im 85/371  lung]
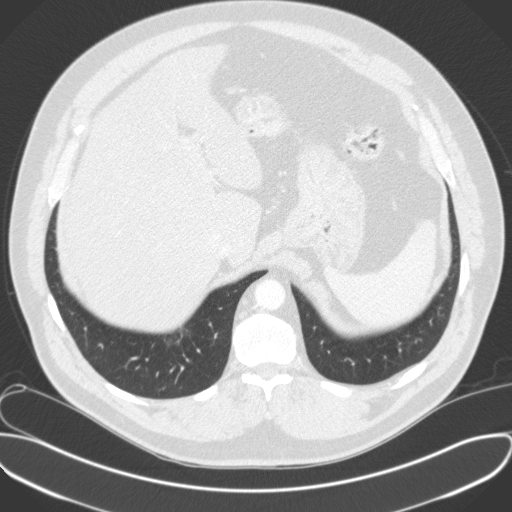
[im 93/371  lung]
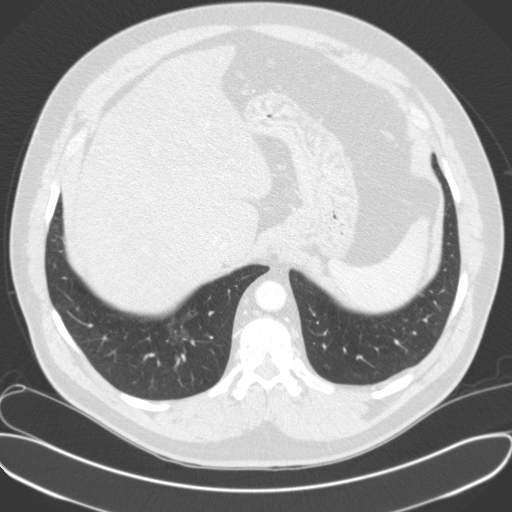
[im 118/371  mediastinal]
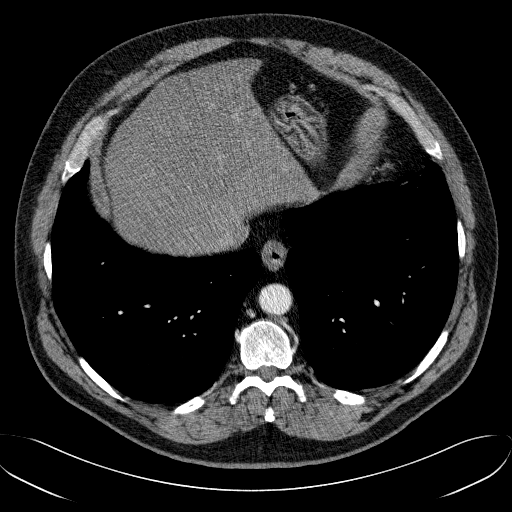
[im 118/371  lung]
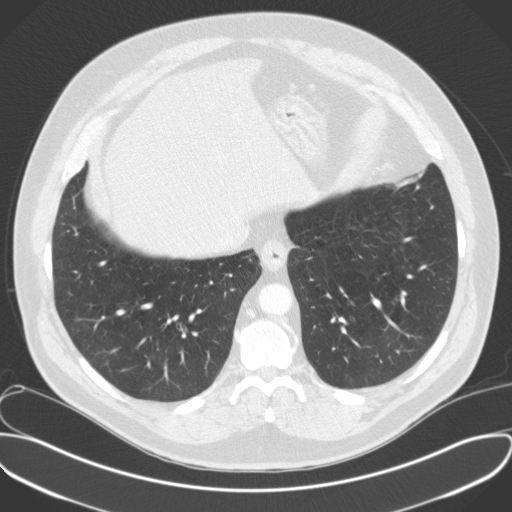
[im 152/371  lung]
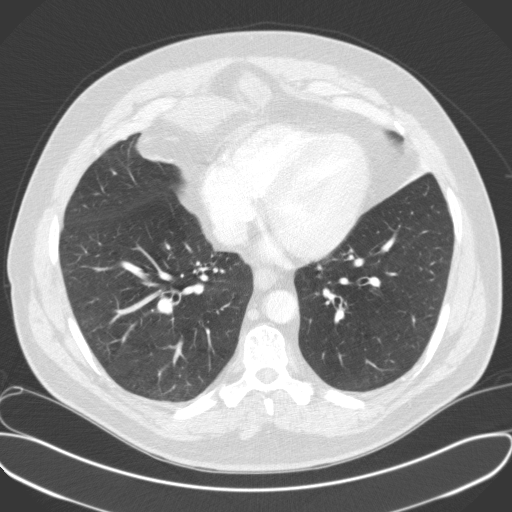
[im 169/371  lung]
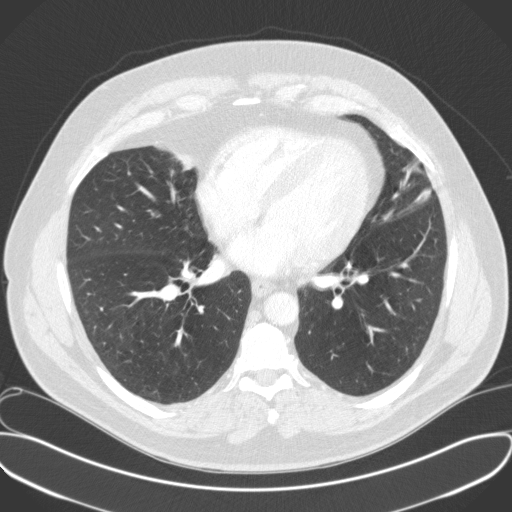
[im 185/371  lung]
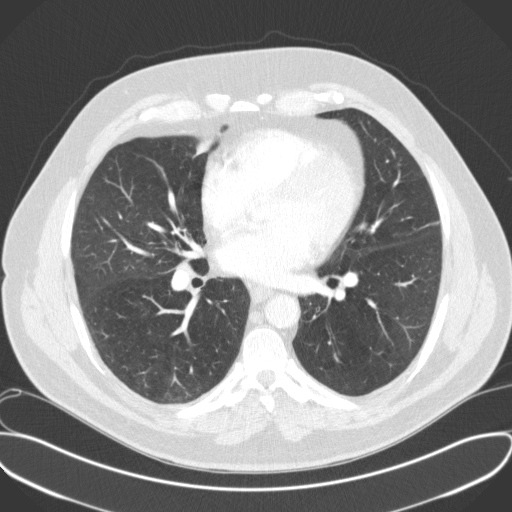
[im 202/371  mediastinal]
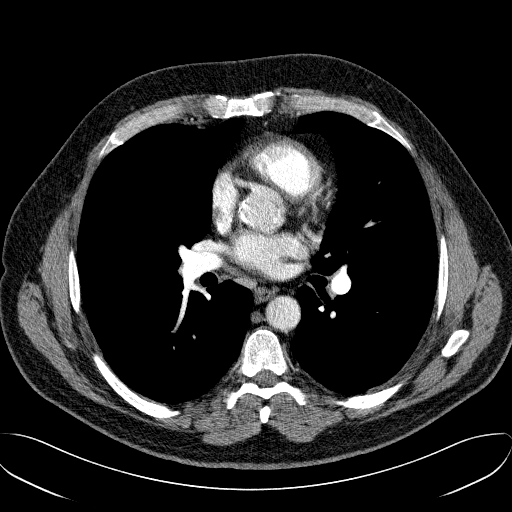
[im 202/371  lung]
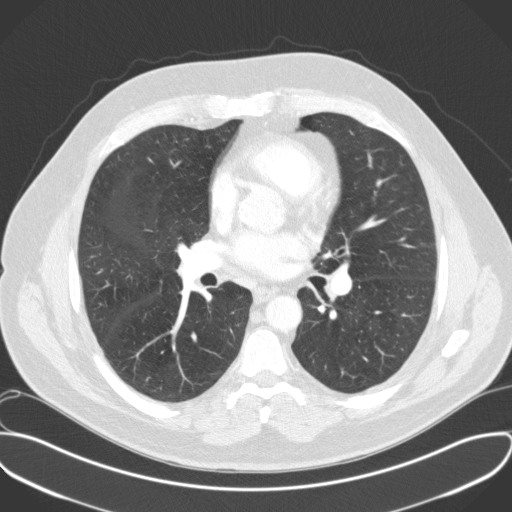
[im 219/371  lung]
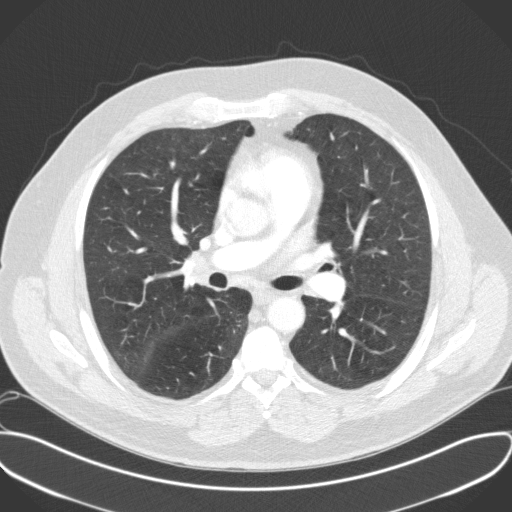
[im 253/371  lung]
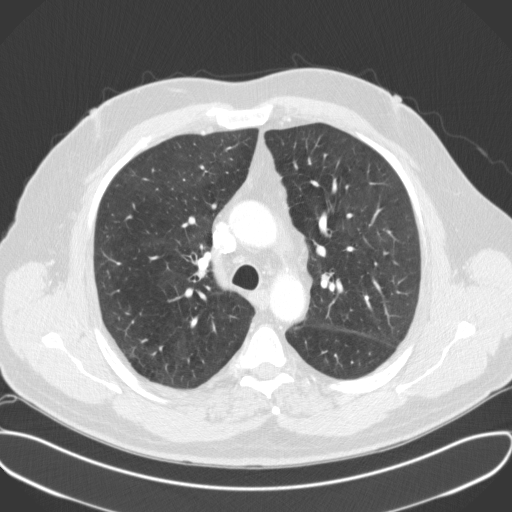
[im 278/371  lung]
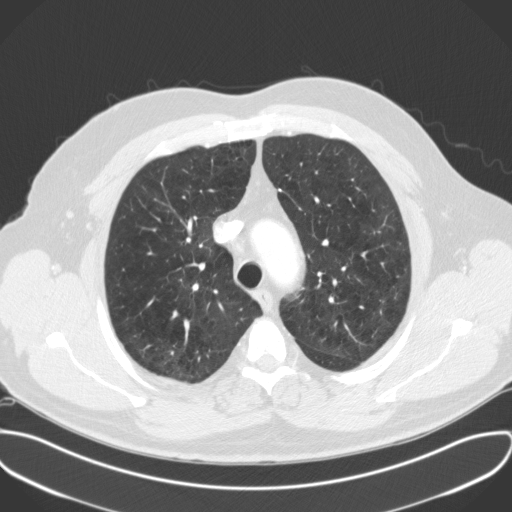
[im 286/371  mediastinal]
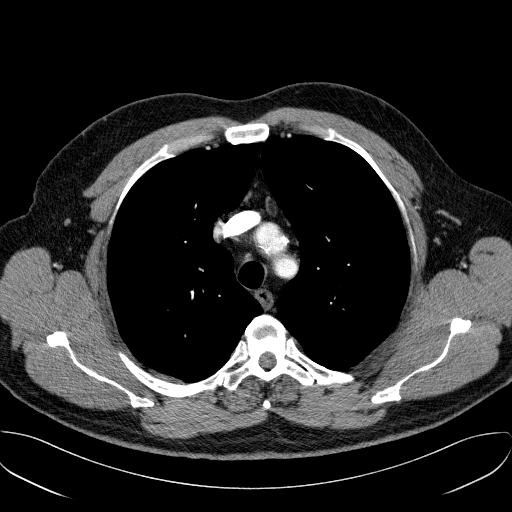
[im 286/371  lung]
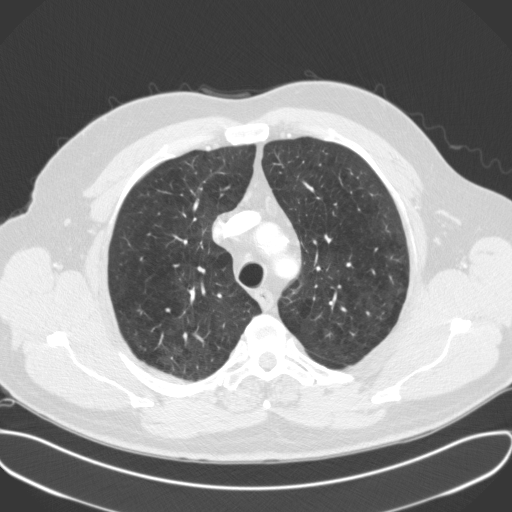
[im 320/371  lung]
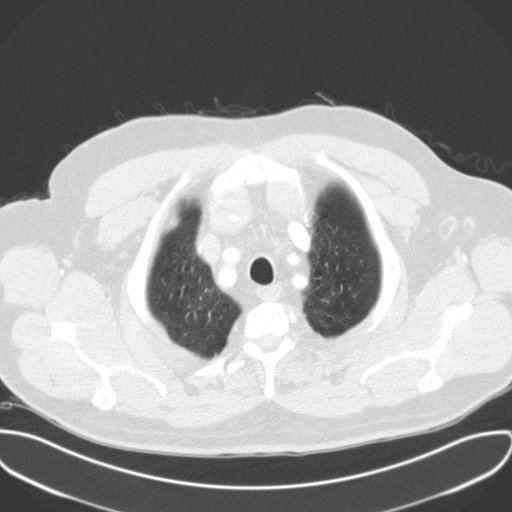
[im 354/371  lung]
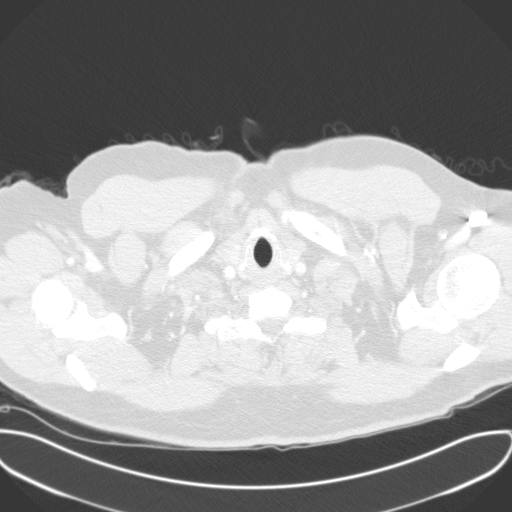

[15 of 33 positions shown; findings below may reference images not displayed]

FINDINGS: Evaluation of the mediastinum and hilar regions and structures
demonstrates no evidence of mediastinal or hilar adenopathy nor masses. Mild
pleural thickening is identified along the posterior apex of the right upper
lobe. Areas of increased density, linearly oriented, project within the
lingula. Scattered emphysematous changes, mild are appreciated throughout
the right and left hemithoraces. The visualized upper abdominal viscera
demonstrate no gross abnormalities.
IMPRESSION: 1. Likely discoid atelectasis with scarring within the lingula.

2. Mild emphysematous changes scattered throughout both lungs.

3. No focal regions of consolidation, masses or nodules.

## 2013-09-17 DIAGNOSIS — R809 Proteinuria, unspecified: Secondary | ICD-10-CM | POA: Insufficient documentation

## 2013-09-17 DIAGNOSIS — E1129 Type 2 diabetes mellitus with other diabetic kidney complication: Secondary | ICD-10-CM | POA: Insufficient documentation

## 2014-02-25 DIAGNOSIS — R809 Proteinuria, unspecified: Secondary | ICD-10-CM

## 2014-02-25 HISTORY — DX: Proteinuria, unspecified: R80.9

## 2014-09-20 ENCOUNTER — Encounter: Payer: Self-pay | Admitting: *Deleted

## 2014-09-23 ENCOUNTER — Encounter
Admission: RE | Disposition: A | Discharge status: Home / Self Care | Payer: Self-pay | Source: Ambulatory Visit | Attending: Gastroenterology

## 2014-09-23 ENCOUNTER — Ambulatory Visit: Payer: 59 | Admitting: Anesthesiology

## 2014-09-23 ENCOUNTER — Encounter: Payer: Self-pay | Admitting: Anesthesiology

## 2014-09-23 ENCOUNTER — Ambulatory Visit
Admission: RE | Admit: 2014-09-23 | Discharge: 2014-09-23 | Disposition: A | Discharge status: Home / Self Care | Payer: 59 | Source: Ambulatory Visit | Attending: Gastroenterology | Admitting: Gastroenterology

## 2014-09-23 DIAGNOSIS — Z86010 Personal history of colonic polyps: Secondary | ICD-10-CM | POA: Insufficient documentation

## 2014-09-23 DIAGNOSIS — E669 Obesity, unspecified: Secondary | ICD-10-CM | POA: Insufficient documentation

## 2014-09-23 DIAGNOSIS — E785 Hyperlipidemia, unspecified: Secondary | ICD-10-CM | POA: Diagnosis not present

## 2014-09-23 DIAGNOSIS — Z7951 Long term (current) use of inhaled steroids: Secondary | ICD-10-CM | POA: Insufficient documentation

## 2014-09-23 DIAGNOSIS — E119 Type 2 diabetes mellitus without complications: Secondary | ICD-10-CM | POA: Insufficient documentation

## 2014-09-23 DIAGNOSIS — Z1211 Encounter for screening for malignant neoplasm of colon: Secondary | ICD-10-CM | POA: Insufficient documentation

## 2014-09-23 DIAGNOSIS — Z7952 Long term (current) use of systemic steroids: Secondary | ICD-10-CM | POA: Insufficient documentation

## 2014-09-23 DIAGNOSIS — L409 Psoriasis, unspecified: Secondary | ICD-10-CM | POA: Insufficient documentation

## 2014-09-23 DIAGNOSIS — D123 Benign neoplasm of transverse colon: Secondary | ICD-10-CM | POA: Insufficient documentation

## 2014-09-23 DIAGNOSIS — J449 Chronic obstructive pulmonary disease, unspecified: Secondary | ICD-10-CM | POA: Insufficient documentation

## 2014-09-23 DIAGNOSIS — D124 Benign neoplasm of descending colon: Secondary | ICD-10-CM | POA: Diagnosis not present

## 2014-09-23 DIAGNOSIS — Z6839 Body mass index (BMI) 39.0-39.9, adult: Secondary | ICD-10-CM | POA: Diagnosis not present

## 2014-09-23 DIAGNOSIS — G4730 Sleep apnea, unspecified: Secondary | ICD-10-CM | POA: Diagnosis not present

## 2014-09-23 DIAGNOSIS — I10 Essential (primary) hypertension: Secondary | ICD-10-CM | POA: Diagnosis not present

## 2014-09-23 DIAGNOSIS — Z79899 Other long term (current) drug therapy: Secondary | ICD-10-CM | POA: Diagnosis not present

## 2014-09-23 DIAGNOSIS — Z87891 Personal history of nicotine dependence: Secondary | ICD-10-CM | POA: Diagnosis not present

## 2014-09-23 DIAGNOSIS — D122 Benign neoplasm of ascending colon: Secondary | ICD-10-CM | POA: Diagnosis not present

## 2014-09-23 HISTORY — PX: COLONOSCOPY WITH PROPOFOL: SHX5780

## 2014-09-23 HISTORY — DX: Psoriasis, unspecified: L40.9

## 2014-09-23 HISTORY — DX: Hyperlipidemia, unspecified: E78.5

## 2014-09-23 HISTORY — DX: Personal history of colonic polyps: Z86.010

## 2014-09-23 HISTORY — DX: Type 2 diabetes mellitus without complications: E11.9

## 2014-09-23 HISTORY — DX: Essential (primary) hypertension: I10

## 2014-09-23 HISTORY — DX: Chronic obstructive pulmonary disease, unspecified: J44.9

## 2014-09-23 HISTORY — DX: Unspecified ovarian cyst, right side: N83.201

## 2014-09-23 HISTORY — DX: Sleep apnea, unspecified: G47.30

## 2014-09-23 LAB — GLUCOSE, CAPILLARY: Glucose-Capillary: 162 mg/dL — ABNORMAL HIGH (ref 65–99)

## 2014-09-23 SURGERY — COLONOSCOPY WITH PROPOFOL
Anesthesia: General

## 2014-09-23 MED ORDER — LIDOCAINE HCL (CARDIAC) 20 MG/ML IV SOLN
INTRAVENOUS | Status: DC | PRN
  Administered 2014-09-23: 50 mg via INTRAVENOUS

## 2014-09-23 MED ORDER — SODIUM CHLORIDE 0.9 % IV SOLN
INTRAVENOUS | Status: DC
  Administered 2014-09-23 (×2): via INTRAVENOUS

## 2014-09-23 MED ORDER — FENTANYL CITRATE (PF) 100 MCG/2ML IJ SOLN
INTRAMUSCULAR | Status: DC | PRN
  Administered 2014-09-23: 50 ug via INTRAVENOUS

## 2014-09-23 MED ORDER — PROPOFOL 10 MG/ML IV BOLUS
INTRAVENOUS | Status: DC | PRN
Start: 2014-09-23 — End: 2014-09-23
  Administered 2014-09-23: 50 mg via INTRAVENOUS

## 2014-09-23 MED ORDER — PROPOFOL INFUSION 10 MG/ML OPTIME
INTRAVENOUS | Status: DC | PRN
Start: 2014-09-23 — End: 2014-09-23
  Administered 2014-09-23: 50 ug/kg/min via INTRAVENOUS

## 2014-09-23 NOTE — Op Note (Signed)
[facility] Patient Name: Dean Simpson Procedure Date: 09/23/2014 10:18 AM MRN: [id number] Account #: [id number] Date of Birth: 29-Jun-1950 Admit Type: Outpatient Age: 64 Room: [facility] ENDO ROOM 4 Gender: Male Note Status: Finalized Procedure:         Colonoscopy Indications:       Personal history of colonic polyps Providers:         Lupita Dawn. Candace Cruise, MD Referring MD:      Ramonita Lab, MD (Referring MD) Medicines:         Monitored Anesthesia Care Complications:     No immediate complications. Procedure:         Pre-Anesthesia Assessment:                    - Prior to the procedure, a History and Physical was                     performed, and patient medications, allergies and                     sensitivities were reviewed. The patient's tolerance of                     previous anesthesia was reviewed.                    - The risks and benefits of the procedure and the sedation                     options and risks were discussed with the patient. All                     questions were answered and informed consent was obtained.                    - After reviewing the risks and benefits, the patient was                     deemed in satisfactory condition to undergo the procedure.                    After obtaining informed consent, the colonoscope was                     passed under direct vision. Throughout the procedure, the                     patient's blood pressure, pulse, and oxygen saturations                     were monitored continuously. The Colonoscope was                     introduced through the anus and advanced to the the cecum,                     identified by appendiceal orifice and ileocecal valve. The                     colonoscopy was performed without difficulty. The patient                     tolerated the procedure well. The quality of the bowel  preparation was poor. Findings:      Four  sessile polyps were found in the descending colon, at the hepatic       flexure and in the ascending colon. The polyps were small in size. These       polyps were removed with a hot snare. Resection and retrieval were       complete.      The exam was otherwise without abnormality. Impression:        - Preparation of the colon was poor.                    - Four small polyps in the descending colon, at the                     hepatic flexure and in the ascending colon. Resected and                     retrieved.                    - The examination was otherwise normal. Recommendation:    - Discharge patient to home.                    - Await pathology results.                    - Repeat colonoscopy in 3 years for surveillance.                    - The findings and recommendations were discussed with the                     patient. Procedure Code(s): --- Professional ---                    [phone], Colonoscopy, flexible; with removal of tumor(s),                     polyp(s), or other lesion(s) by snare technique Diagnosis Code(s): --- Professional ---                    D12.4, Benign neoplasm of descending colon                    D12.3, Benign neoplasm of transverse colon                    D12.2, Benign neoplasm of ascending colon                    Z86.010, Personal history of colonic polyps CPT copyright 2014 [employer]. All rights reserved. The codes documented in this report are preliminary and upon coder review may  be revised to meet current compliance requirements. Hulen Luster, MD 09/23/2014 11:00:52 AM This report has been signed electronically. Number of Addenda: 0 Note Initiated On: 09/23/2014 10:18 AM Scope Withdrawal Time: 0 hours 13 minutes 54 seconds  Total Procedure Duration: 0 hours 17 minutes 58 seconds       [facility]

## 2014-09-23 NOTE — Transfer of Care (Signed)
Immediate Anesthesia Transfer of Care Note  Patient: Dean Simpson  Procedure(s) Performed: Procedure(s): COLONOSCOPY WITH PROPOFOL (N/A)  Patient Location: PACU/ENDO  Anesthesia Type:General  Level of Consciousness: Alert, Awake, Oriented  Airway & Oxygen Therapy: Patient Spontanous Breathing  Post-op Assessment: Report given to RN  Post vital signs: Reviewed and stable  Last Vitals:  Filed Vitals:   09/23/14 1059  BP: 149/70  Pulse: 60  Temp: 36.4 C  Resp: 22    Complications: No apparent anesthesia complications

## 2014-09-23 NOTE — Anesthesia Postprocedure Evaluation (Signed)
  Anesthesia Post-op Note  Patient: Dean Simpson  Procedure(s) Performed: Procedure(s): COLONOSCOPY WITH PROPOFOL (N/A)  Anesthesia type:General  Patient location: PACU  Post pain: Pain level controlled  Post assessment: Post-op Vital signs reviewed, Patient's Cardiovascular Status Stable, Respiratory Function Stable, Patent Airway and No signs of Nausea or vomiting  Post vital signs: Reviewed and stable  Last Vitals:  Filed Vitals:   09/23/14 1130  BP: 131/82  Pulse: 58  Temp:   Resp: 17    Level of consciousness: awake, alert  and patient cooperative  Complications: No apparent anesthesia complications

## 2014-09-23 NOTE — H&P (Signed)
Primary Care Physician:  Adin Hector, MD Primary Gastroenterologist:  Dr. Candace Cruise  Pre-Procedure History & Physical: HPI:  Dean Simpson is a 64 y.o. male is here for colonoscopy.   Past Medical History  Diagnosis Date  . Hypertension   . Hyperlipidemia   . COPD (chronic obstructive pulmonary disease)   . Diabetes mellitus without complication   . Sleep apnea   . Psoriasis   . Hx of colonic polyps   . Right ovarian cyst     No past surgical history on file.  Prior to Admission medications   Medication Sig Start Date End Date Taking? Authorizing Provider  albuterol-ipratropium (COMBIVENT) 18-103 MCG/ACT inhaler Inhale into the lungs every 4 (four) hours.   Yes Historical Provider, MD  amLODipine (NORVASC) 10 MG tablet Take 10 mg by mouth daily.   Yes Historical Provider, MD  atorvastatin (LIPITOR) 80 MG tablet Take 80 mg by mouth daily.   Yes Historical Provider, MD  canagliflozin (INVOKANA) 300 MG TABS tablet Take 300 mg by mouth daily before breakfast.   Yes Historical Provider, MD  cloNIDine (CATAPRES) 0.1 MG tablet Take 0.1 mg by mouth 2 (two) times daily.   Yes Historical Provider, MD  doxazosin (CARDURA) 4 MG tablet Take 4 mg by mouth daily.   Yes Historical Provider, MD  fluocinonide-emollient (LIDEX-E) 0.05 % cream Apply 1 application topically 2 (two) times daily.   Yes Historical Provider, MD  Fluticasone-Salmeterol (ADVAIR) 500-50 MCG/DOSE AEPB Inhale 1 puff into the lungs 2 (two) times daily.   Yes Historical Provider, MD  glimepiride (AMARYL) 4 MG tablet Take 4 mg by mouth daily with breakfast.   Yes Historical Provider, MD  metFORMIN (GLUCOPHAGE) 500 MG tablet Take by mouth 2 (two) times daily with a meal.   Yes Historical Provider, MD  metoprolol succinate (TOPROL-XL) 100 MG 24 hr tablet Take 100 mg by mouth daily. Take with or immediately following a meal.   Yes Historical Provider, MD  valsartan-hydrochlorothiazide (DIOVAN-HCT) 320-25 MG per  tablet Take 1 tablet by mouth daily.   Yes Historical Provider, MD    Allergies as of 09/19/2014  . (Not on File)    No family history on file.  History   Social History  . Marital Status: Married    Spouse Name: N/A  . Number of Children: N/A  . Years of Education: N/A   Occupational History  . Not on file.   Social History Main Topics  . Smoking status: Former Research scientist (life sciences)  . Smokeless tobacco: Not on file  . Alcohol Use: Not on file  . Drug Use: Not on file  . Sexual Activity: Not on file   Other Topics Concern  . Not on file   Social History Narrative  . No narrative on file    Review of Systems: See HPI, otherwise negative ROS  Physical Exam: There were no vitals taken for this visit. General:   Alert,  pleasant and cooperative in NAD Head:  Normocephalic and atraumatic. Neck:  Supple; no masses or thyromegaly. Lungs:  Clear throughout to auscultation.    Heart:  Regular rate and rhythm. Abdomen:  Soft, nontender and nondistended. Normal bowel sounds, without guarding, and without rebound.   Neurologic:  Alert and  oriented x4;  grossly normal neurologically.  Impression/Plan: Dean Simpson is here for colonoscopy to be performed for personal hx of colon polyps. Risks, benefits, limitations, and alternatives regarding colonoscopy have been reviewed with the patient.  Questions have  been answered.  All parties agreeable.   Regis Wiland, Lupita Dawn, MD  09/23/2014, 10:05 AM

## 2014-09-23 NOTE — Anesthesia Preprocedure Evaluation (Signed)
Anesthesia Evaluation  Patient identified by MRN, date of birth, ID band Patient awake    Reviewed: Allergy & Precautions, NPO status , Patient's Chart, lab work & pertinent test results, reviewed documented beta blocker date and time   Airway Mallampati: II  TM Distance: >3 FB     Dental  (+) Upper Dentures, Lower Dentures   Pulmonary sleep apnea , COPD COPD inhaler, former smoker,  breath sounds clear to auscultation  Pulmonary exam normal       Cardiovascular hypertension, Normal cardiovascular examRhythm:Regular Rate:Normal     Neuro/Psych negative neurological ROS  negative psych ROS   GI/Hepatic Neg liver ROS,   Endo/Other  diabetes, Well Controlled, Type 2  Renal/GU negative Renal ROS  negative genitourinary   Musculoskeletal negative musculoskeletal ROS (+)   Abdominal Normal abdominal exam  (+) + obese,   Peds negative pediatric ROS (+)  Hematology negative hematology ROS (+)   Anesthesia Other Findings   Reproductive/Obstetrics                             Anesthesia Physical Anesthesia Plan  ASA: III  Anesthesia Plan: General   Post-op Pain Management:    Induction: Intravenous  Airway Management Planned: Nasal Cannula  Additional Equipment:   Intra-op Plan:   Post-operative Plan:   Informed Consent: I have reviewed the patients History and Physical, chart, labs and discussed the procedure including the risks, benefits and alternatives for the proposed anesthesia with the patient or authorized representative who has indicated his/her understanding and acceptance.   Dental advisory given  Plan Discussed with: CRNA and Surgeon  Anesthesia Plan Comments:         Anesthesia Quick Evaluation

## 2014-09-24 LAB — SURGICAL PATHOLOGY

## 2014-09-26 ENCOUNTER — Encounter: Payer: Self-pay | Admitting: Gastroenterology

## 2015-06-24 IMAGING — US US CAROTID DUPLEX BILAT
1 series · 13 of 24 positions shown · non-contrast
Comparison: None.

CLINICAL DATA: Light facial numbness and tingling.

EXAM:
BILATERAL CAROTID DUPLEX ULTRASOUND
TECHNIQUE: Gray scale imaging, color Doppler and duplex ultrasound were
performed of bilateral carotid and vertebral arteries in the neck.

[Series 1: us carotid duplex bilat · 13 of 55 slices shown]
[im 1/55]
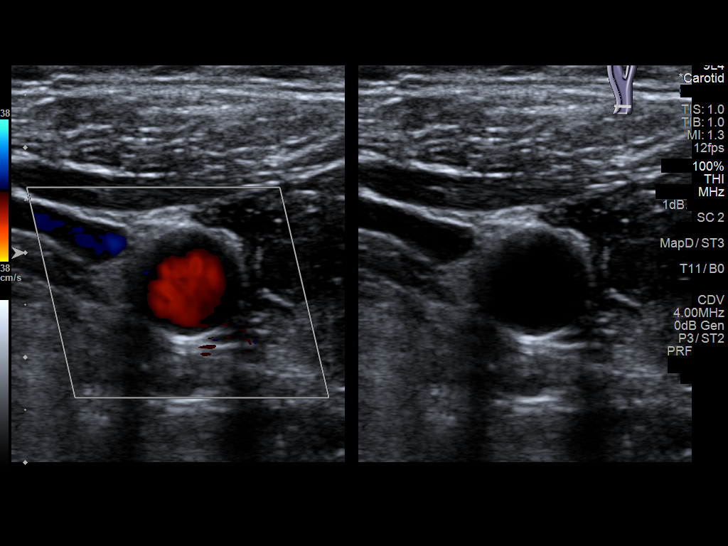
[im 5/55]
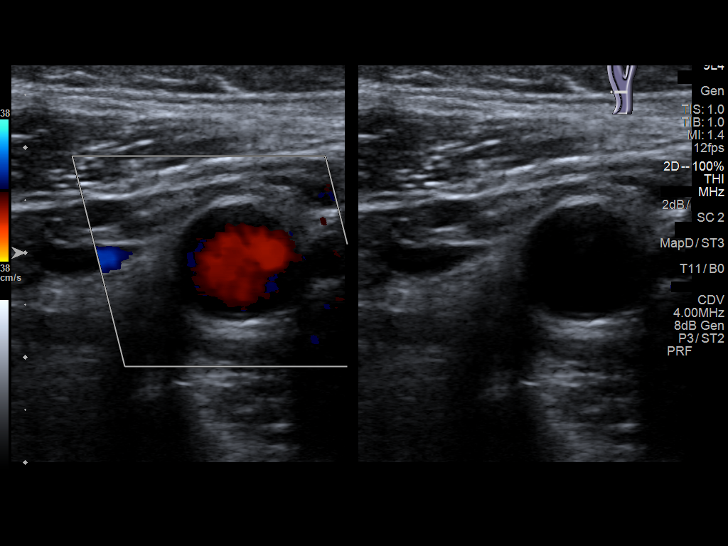
[im 10/55]
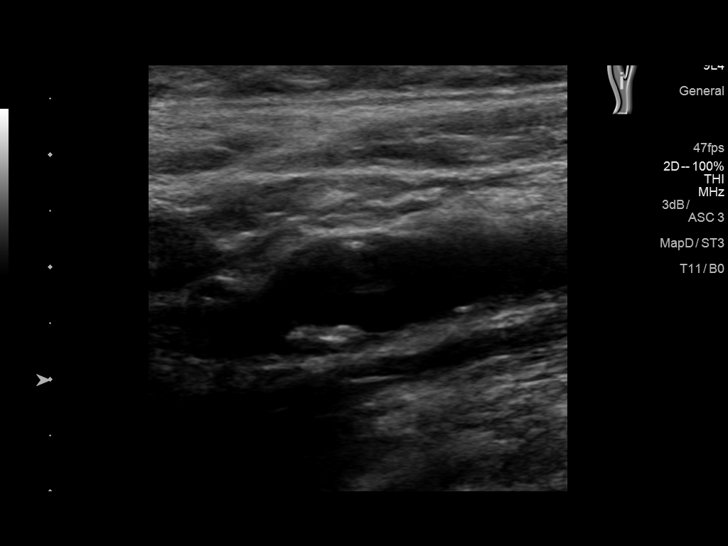
[im 15/55]
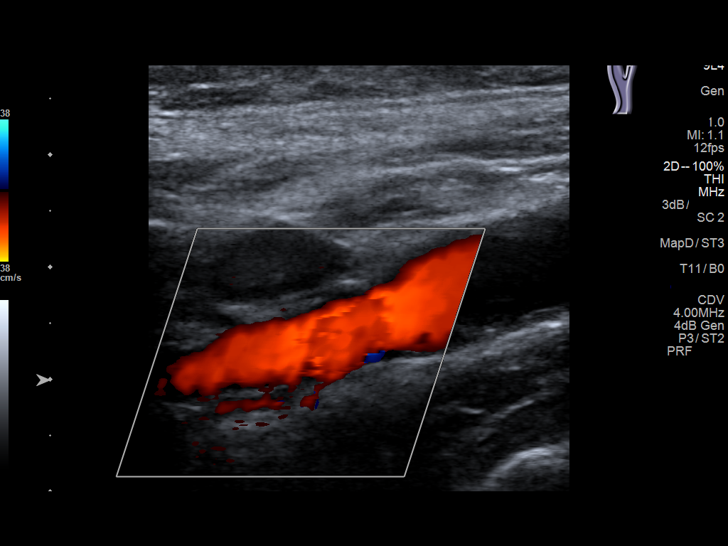
[im 19/55]
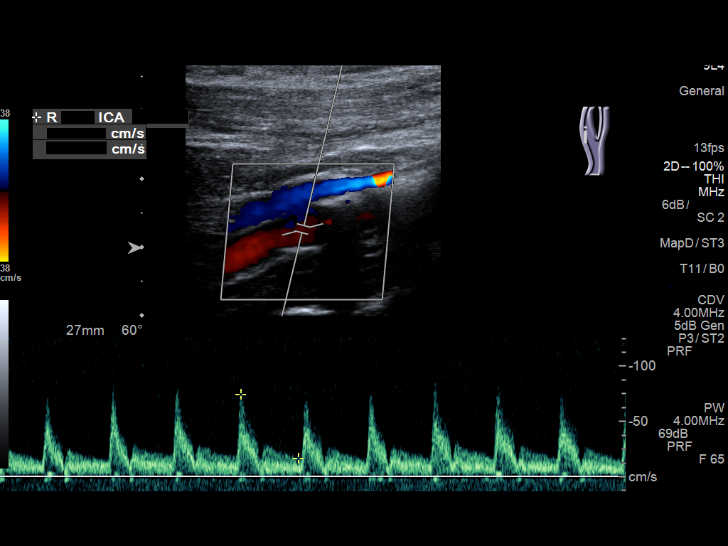
[im 24/55]
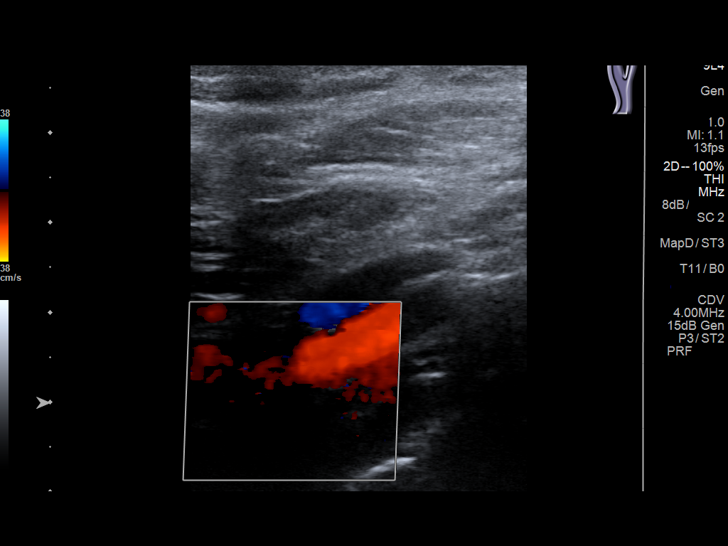
[im 29/55]
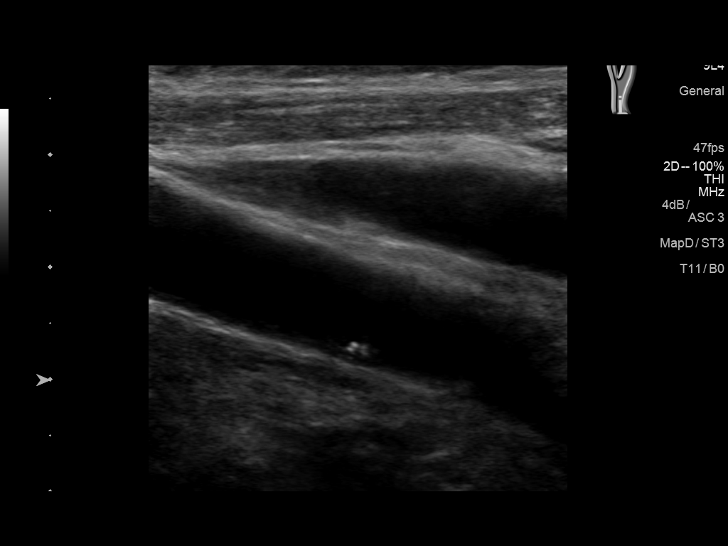
[im 31/55]
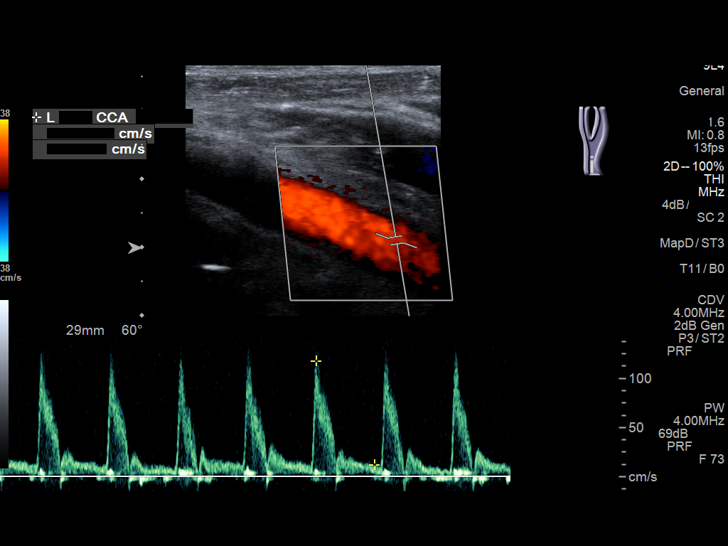
[im 36/55]
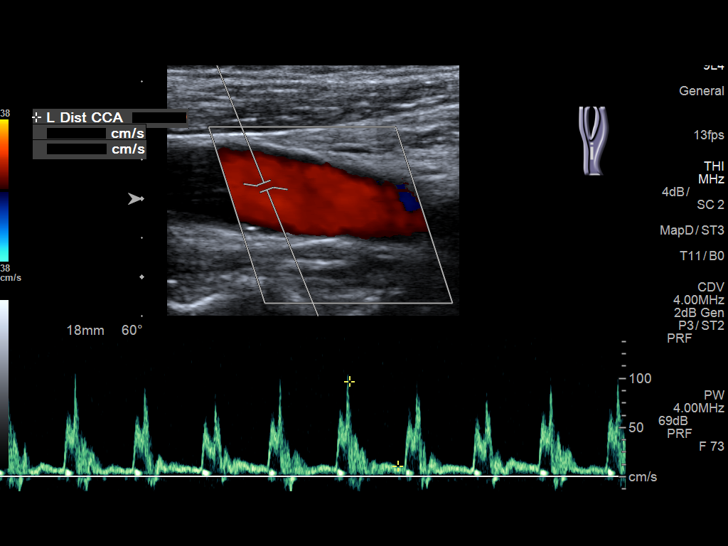
[im 40/55]
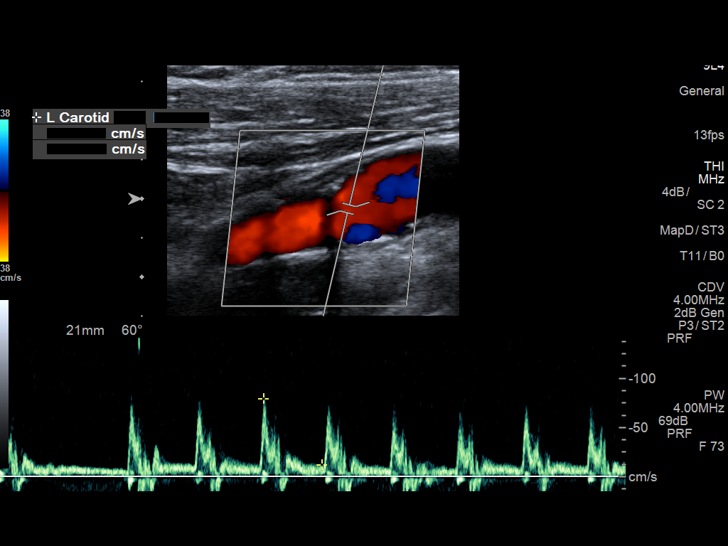
[im 45/55]
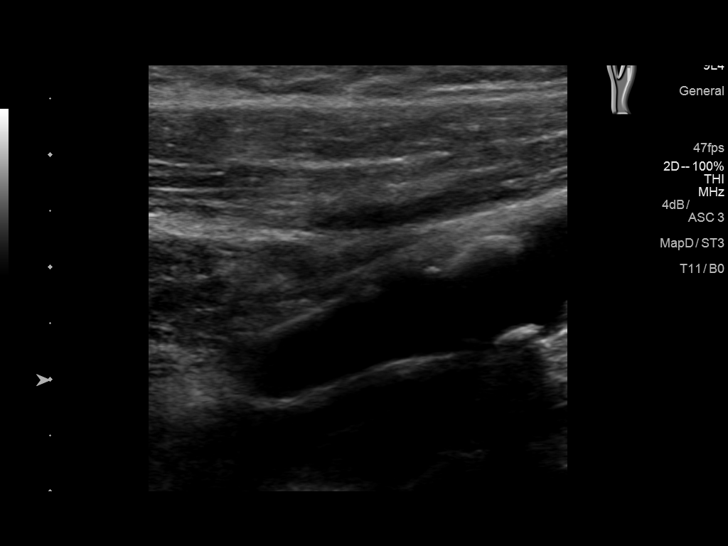
[im 50/55]
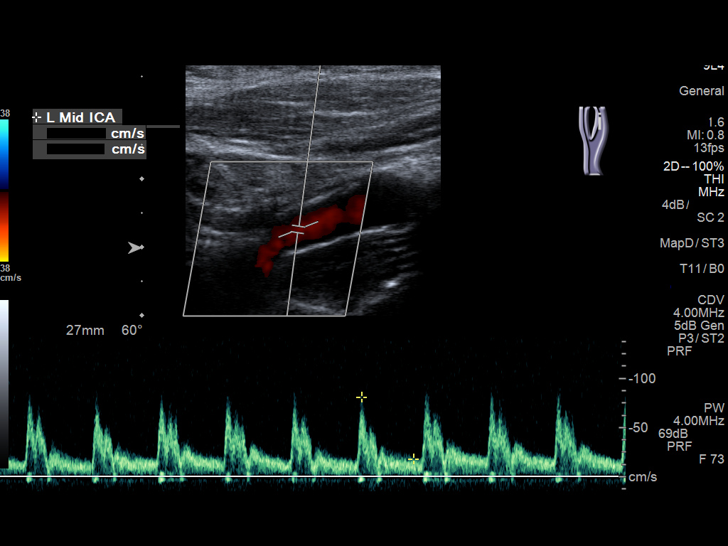
[im 55/55]
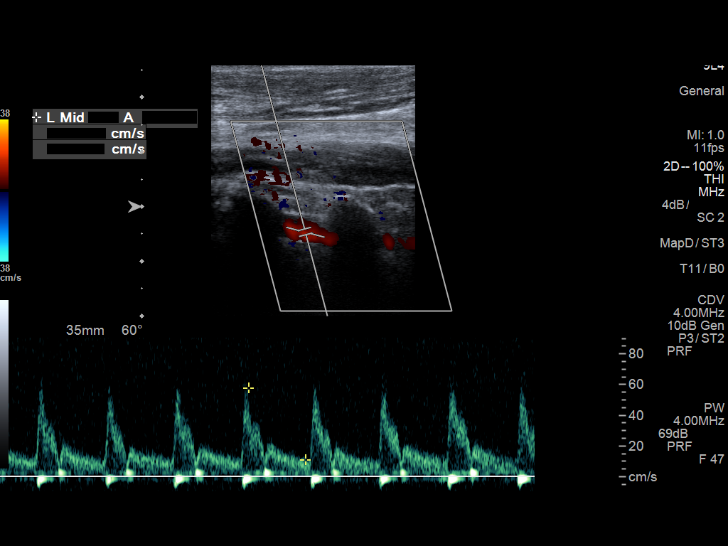

[13 of 24 positions shown; findings below may reference images not displayed]

FINDINGS: Criteria: Quantification of carotid stenosis is based on velocity
parameters that correlate the residual internal carotid diameter
with NASCET-based stenosis levels, using the diameter of the distal
internal carotid lumen as the denominator for stenosis measurement.

The following velocity measurements were obtained:

RIGHT

ICA:  97 cm/sec

CCA:  59 cm/sec

SYSTOLIC ICA/CCA RATIO:

DIASTOLIC ICA/CCA RATIO:

ECA:  106 cm/sec

LEFT

ICA:  81 cm/sec

CCA:  97 cm/sec

SYSTOLIC ICA/CCA RATIO:

DIASTOLIC ICA/CCA RATIO:

ECA:  140 cm/sec

RIGHT CAROTID ARTERY: Mild calcified plaque in the bulb. Low
resistance internal carotid Doppler pattern.

RIGHT VERTEBRAL ARTERY:  Antegrade.

LEFT CAROTID ARTERY: Mild calcified plaque in the bulb. Low
resistance internal carotid Doppler pattern.

LEFT VERTEBRAL ARTERY:  Antegrade.
IMPRESSION: Less than 50% stenosis in the right and left internal carotid
arteries.

## 2015-10-13 ENCOUNTER — Other Ambulatory Visit: Payer: Self-pay | Admitting: Internal Medicine

## 2015-10-13 DIAGNOSIS — R202 Paresthesia of skin: Principal | ICD-10-CM

## 2015-10-13 DIAGNOSIS — R200 Anesthesia of skin: Secondary | ICD-10-CM

## 2015-11-03 ENCOUNTER — Ambulatory Visit
Admission: RE | Admit: 2015-11-03 | Discharge: 2015-11-03 | Disposition: A | Discharge status: Home / Self Care | Payer: BLUE CROSS/BLUE SHIELD | Source: Ambulatory Visit | Attending: Internal Medicine | Admitting: Internal Medicine

## 2015-11-03 DIAGNOSIS — R202 Paresthesia of skin: Principal | ICD-10-CM

## 2015-11-03 DIAGNOSIS — R200 Anesthesia of skin: Secondary | ICD-10-CM

## 2015-11-03 IMAGING — CT CT HEAD W/O CM
1 series · 16 of 30 positions shown, 20 images · non-contrast
Comparison: [DATE] carotid ultrasound

CLINICAL DATA: Right facial paresthesias, TIA symptoms,
hypertension and hyperlipidemia

EXAM:
CT HEAD WITHOUT CONTRAST
TECHNIQUE: Contiguous axial images were obtained from the base of the skull
through the vertex without intravenous contrast.

[Series 2: head wo · axial · 0.39mm/px · z∈[-219,-84]mm · 16 of 30 slices shown, 20 images]
[im 2/30  brain]
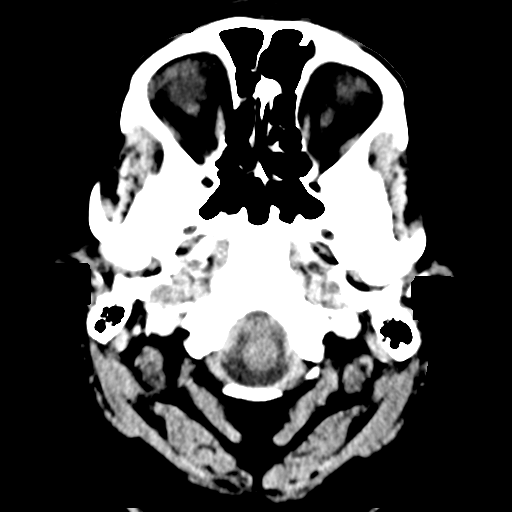
[im 2/30  bone]
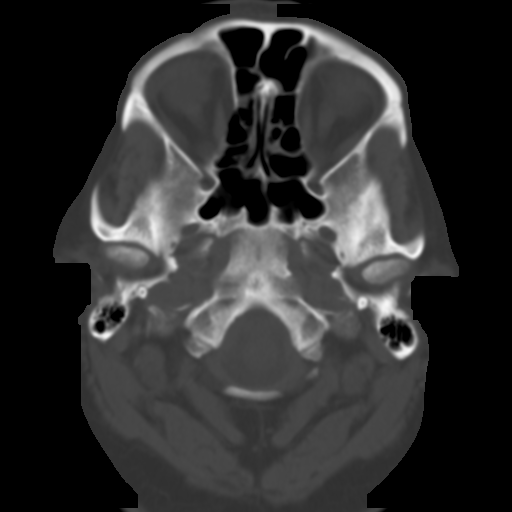
[im 4/30  brain]
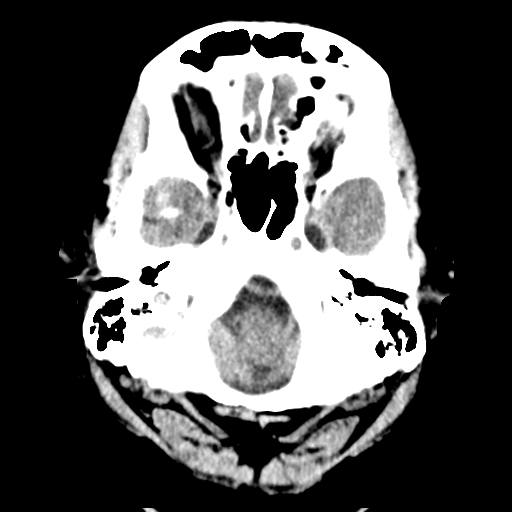
[im 6/30  brain]
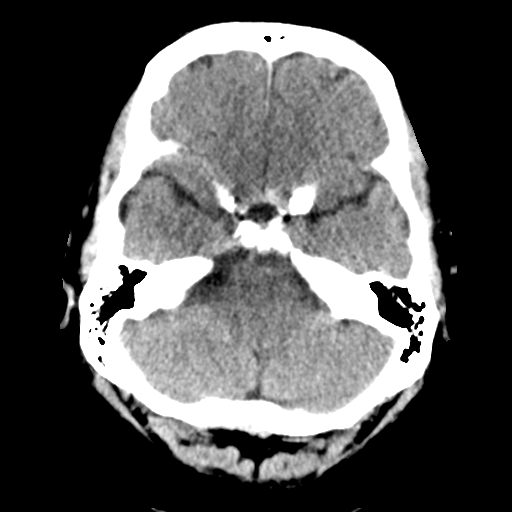
[im 8/30  brain]
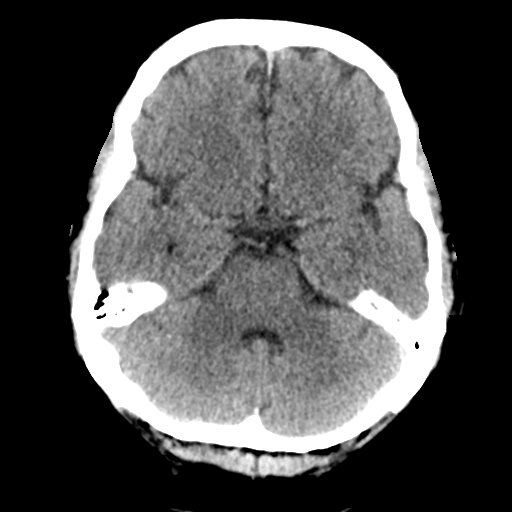
[im 9/30  brain]
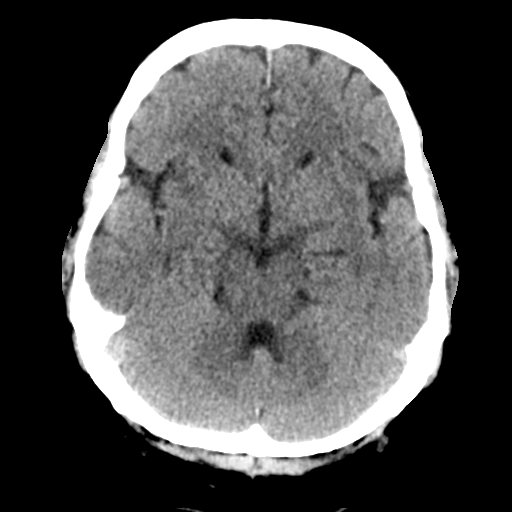
[im 9/30  bone]
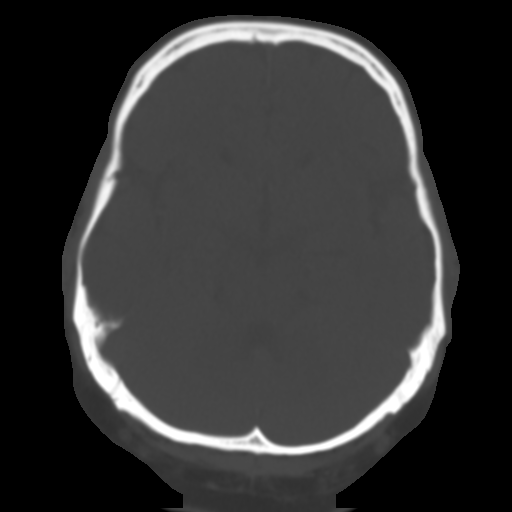
[im 11/30  brain]
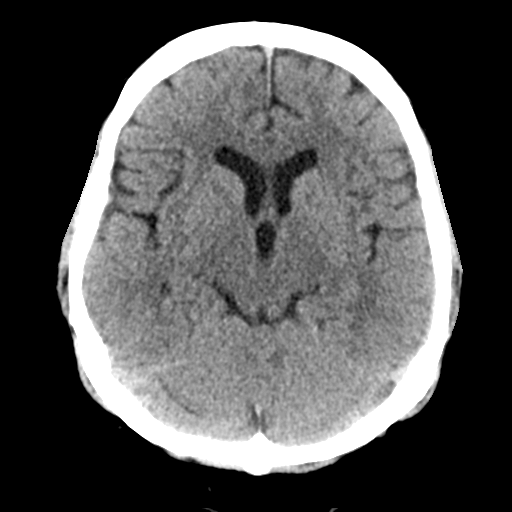
[im 13/30  brain]
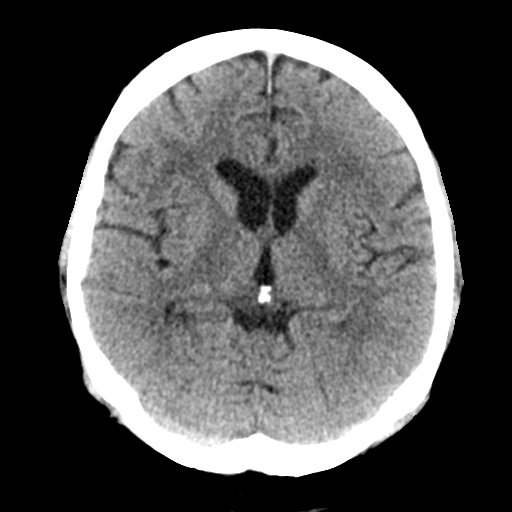
[im 15/30  brain]
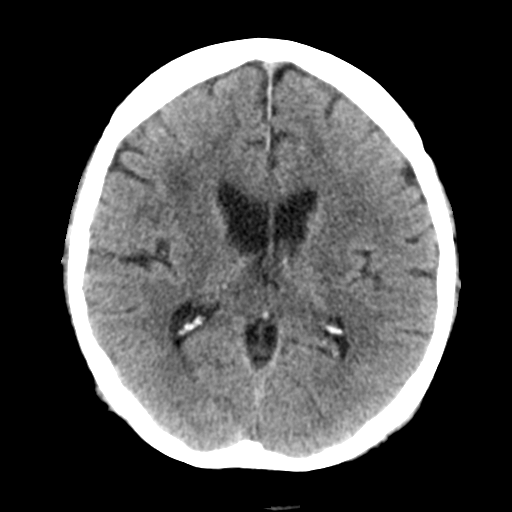
[im 16/30  brain]
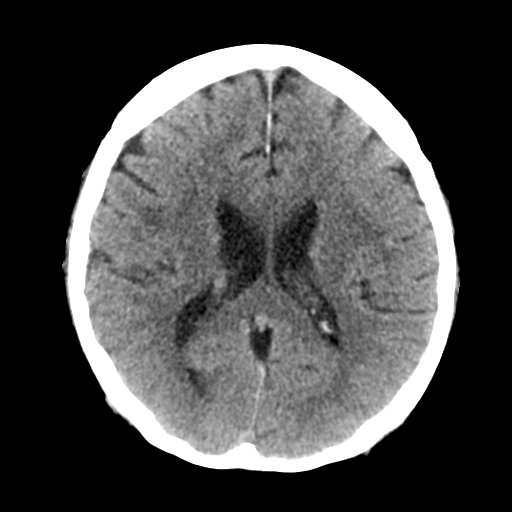
[im 16/30  bone]
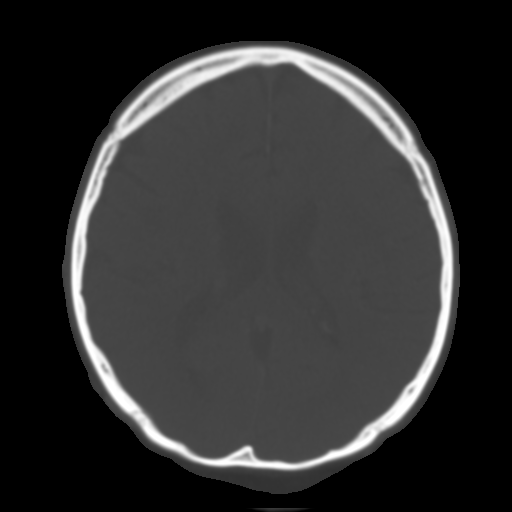
[im 18/30  brain]
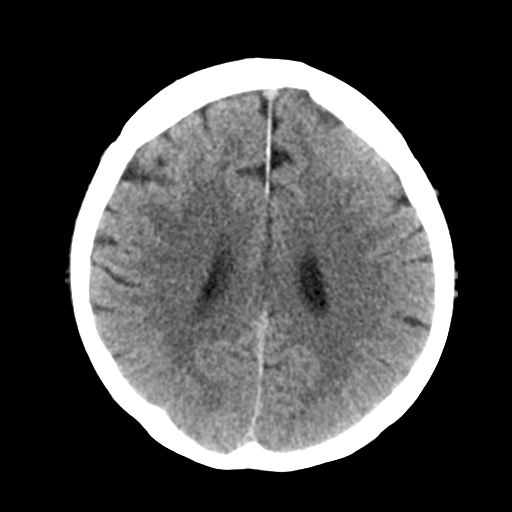
[im 20/30  brain]
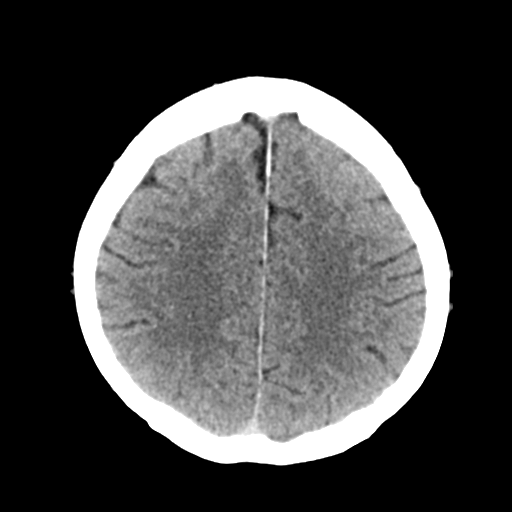
[im 22/30  brain]
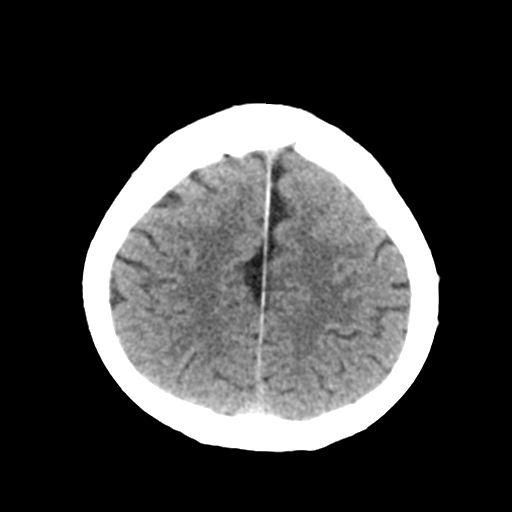
[im 23/30  brain]
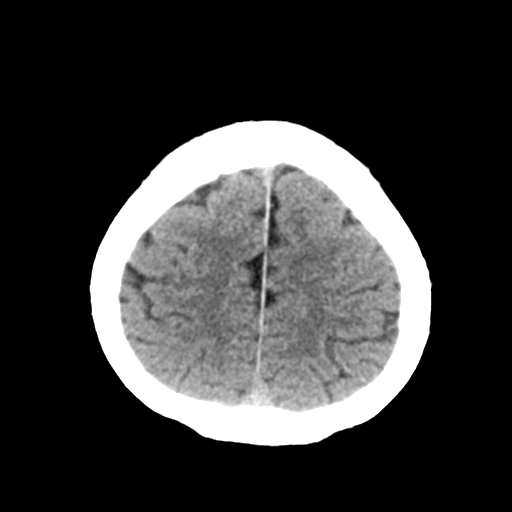
[im 23/30  bone]
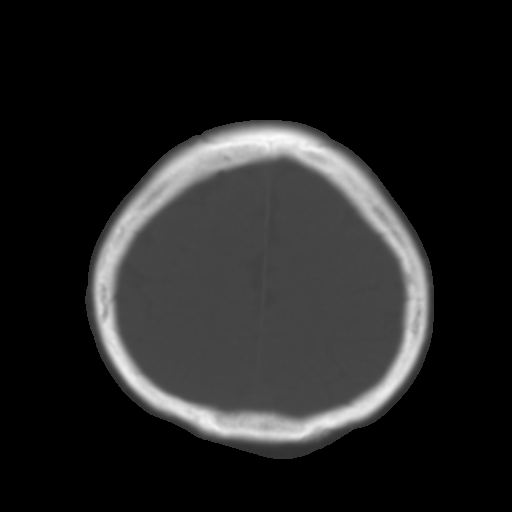
[im 25/30  brain]
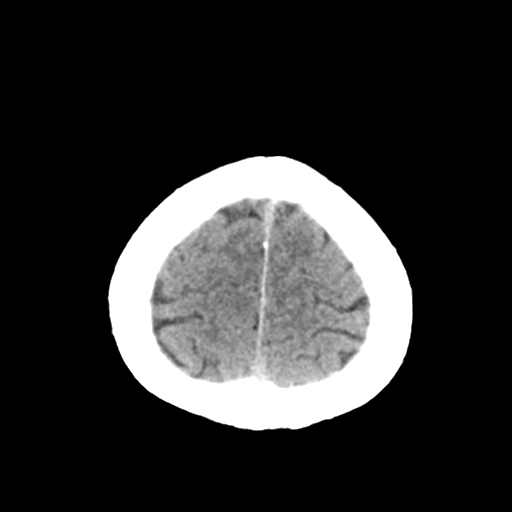
[im 27/30  brain]
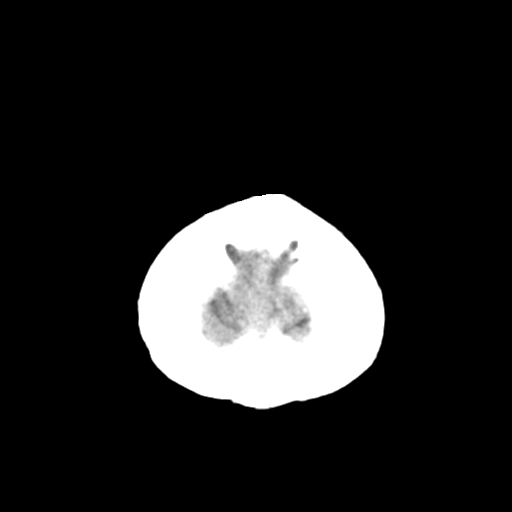
[im 29/30  brain]
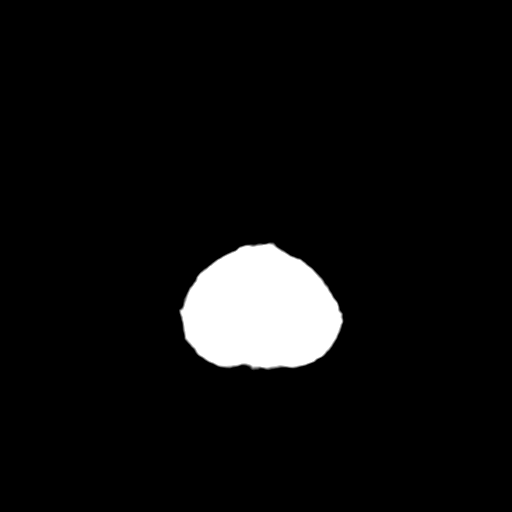

[16 of 30 positions shown; findings below may reference images not displayed]

FINDINGS: Brain: No acute intracranial hemorrhage, definite infarction,
midline shift, herniation, hydrocephalus, or extra-axial fluid
collection. No mass lesion evident. Minor patchy white matter
microvascular changes throughout the cerebral hemispheres. No focal
mass effect or edema. Gray-white matter differentiation is
maintained. Cisterns are patent. Ventricles are symmetric. No
cerebellar abnormality.

Vascular: No hyperdense vessel or unexpected calcification.

Skull: Negative for fracture or focal lesion.

Sinuses/Orbits: No acute findings.

Other: None.
IMPRESSION: Minor chronic white matter microvascular ischemic changes.

No acute intracranial process by noncontrast CT.

## 2018-03-24 ENCOUNTER — Encounter: Payer: Self-pay | Admitting: *Deleted

## 2018-03-27 ENCOUNTER — Ambulatory Visit
Admission: RE | Admit: 2018-03-27 | Discharge: 2018-03-27 | Disposition: A | Discharge status: Home / Self Care | Payer: Medicare Other | Source: Ambulatory Visit | Attending: Unknown Physician Specialty | Admitting: Unknown Physician Specialty

## 2018-03-27 ENCOUNTER — Ambulatory Visit: Payer: Medicare Other | Admitting: Anesthesiology

## 2018-03-27 ENCOUNTER — Encounter: Payer: Self-pay | Admitting: Anesthesiology

## 2018-03-27 ENCOUNTER — Encounter
Admission: RE | Disposition: A | Discharge status: Home / Self Care | Payer: Self-pay | Source: Ambulatory Visit | Attending: Unknown Physician Specialty

## 2018-03-27 DIAGNOSIS — Z7982 Long term (current) use of aspirin: Secondary | ICD-10-CM | POA: Insufficient documentation

## 2018-03-27 DIAGNOSIS — I10 Essential (primary) hypertension: Secondary | ICD-10-CM | POA: Diagnosis not present

## 2018-03-27 DIAGNOSIS — E785 Hyperlipidemia, unspecified: Secondary | ICD-10-CM | POA: Diagnosis not present

## 2018-03-27 DIAGNOSIS — D120 Benign neoplasm of cecum: Secondary | ICD-10-CM | POA: Insufficient documentation

## 2018-03-27 DIAGNOSIS — Z87891 Personal history of nicotine dependence: Secondary | ICD-10-CM | POA: Diagnosis not present

## 2018-03-27 DIAGNOSIS — D128 Benign neoplasm of rectum: Secondary | ICD-10-CM | POA: Diagnosis not present

## 2018-03-27 DIAGNOSIS — E119 Type 2 diabetes mellitus without complications: Secondary | ICD-10-CM | POA: Diagnosis not present

## 2018-03-27 DIAGNOSIS — Z7951 Long term (current) use of inhaled steroids: Secondary | ICD-10-CM | POA: Diagnosis not present

## 2018-03-27 DIAGNOSIS — J439 Emphysema, unspecified: Secondary | ICD-10-CM | POA: Insufficient documentation

## 2018-03-27 DIAGNOSIS — Z7984 Long term (current) use of oral hypoglycemic drugs: Secondary | ICD-10-CM | POA: Diagnosis not present

## 2018-03-27 DIAGNOSIS — D124 Benign neoplasm of descending colon: Secondary | ICD-10-CM | POA: Diagnosis not present

## 2018-03-27 DIAGNOSIS — G4730 Sleep apnea, unspecified: Secondary | ICD-10-CM | POA: Insufficient documentation

## 2018-03-27 DIAGNOSIS — K635 Polyp of colon: Secondary | ICD-10-CM | POA: Insufficient documentation

## 2018-03-27 DIAGNOSIS — Z09 Encounter for follow-up examination after completed treatment for conditions other than malignant neoplasm: Secondary | ICD-10-CM | POA: Insufficient documentation

## 2018-03-27 DIAGNOSIS — Z79899 Other long term (current) drug therapy: Secondary | ICD-10-CM | POA: Insufficient documentation

## 2018-03-27 HISTORY — DX: Other microscopic hematuria: R31.29

## 2018-03-27 HISTORY — DX: Proteinuria, unspecified: R80.9

## 2018-03-27 HISTORY — PX: COLONOSCOPY WITH PROPOFOL: SHX5780

## 2018-03-27 HISTORY — DX: Personal history of nicotine dependence: Z87.891

## 2018-03-27 HISTORY — DX: Emphysema, unspecified: J43.9

## 2018-03-27 HISTORY — DX: Benign prostatic hyperplasia without lower urinary tract symptoms: N40.0

## 2018-03-27 LAB — GLUCOSE, CAPILLARY: Glucose-Capillary: 160 mg/dL — ABNORMAL HIGH (ref 70–99)

## 2018-03-27 SURGERY — COLONOSCOPY WITH PROPOFOL
Anesthesia: General

## 2018-03-27 MED ORDER — SODIUM CHLORIDE 0.9 % IV SOLN
INTRAVENOUS | Status: DC
  Administered 2018-03-27: 1000 mL via INTRAVENOUS

## 2018-03-27 MED ORDER — PROPOFOL 500 MG/50ML IV EMUL
INTRAVENOUS | Status: DC | PRN
  Administered 2018-03-27: 50 ug/kg/min via INTRAVENOUS

## 2018-03-27 MED ORDER — FENTANYL CITRATE (PF) 100 MCG/2ML IJ SOLN
INTRAMUSCULAR | Status: AC
  Filled 2018-03-27: qty 2

## 2018-03-27 MED ORDER — LIDOCAINE HCL (PF) 2 % IJ SOLN
INTRAMUSCULAR | Status: AC
  Filled 2018-03-27: qty 10

## 2018-03-27 MED ORDER — SODIUM CHLORIDE 0.9 % IV SOLN: INTRAVENOUS | Status: DC

## 2018-03-27 MED ORDER — FENTANYL CITRATE (PF) 100 MCG/2ML IJ SOLN
INTRAMUSCULAR | Status: DC | PRN
  Administered 2018-03-27 (×2): 50 ug via INTRAVENOUS

## 2018-03-27 MED ORDER — MIDAZOLAM HCL 5 MG/5ML IJ SOLN
INTRAMUSCULAR | Status: DC | PRN
  Administered 2018-03-27: 2 mg via INTRAVENOUS

## 2018-03-27 MED ORDER — MIDAZOLAM HCL 2 MG/2ML IJ SOLN
INTRAMUSCULAR | Status: AC
  Filled 2018-03-27: qty 2

## 2018-03-27 MED ORDER — LIDOCAINE HCL (PF) 2 % IJ SOLN
INTRAMUSCULAR | Status: DC | PRN
  Administered 2018-03-27: 100 mg

## 2018-03-27 MED ORDER — PROPOFOL 10 MG/ML IV BOLUS
INTRAVENOUS | Status: DC | PRN
  Administered 2018-03-27: 20 mg via INTRAVENOUS
  Administered 2018-03-27: 30 mg via INTRAVENOUS
  Administered 2018-03-27: 20 mg via INTRAVENOUS

## 2018-03-27 NOTE — Anesthesia Preprocedure Evaluation (Signed)
Anesthesia Evaluation  Patient identified by MRN, date of birth, ID band Patient awake    Reviewed: Allergy & Precautions, H&P , NPO status , Patient's Chart, lab work & pertinent test results  History of Anesthesia Complications Negative for: history of anesthetic complications  Airway Mallampati: III  TM Distance: <3 FB Neck ROM: limited    Dental  (+) Poor Dentition, Missing, Upper Dentures, Lower Dentures   Pulmonary shortness of breath and with exertion, sleep apnea , COPD, former smoker,           Cardiovascular Exercise Tolerance: Good hypertension, (-) angina(-) Past MI      Neuro/Psych negative neurological ROS  negative psych ROS   GI/Hepatic negative GI ROS, Neg liver ROS,   Endo/Other  diabetes, Type 2  Renal/GU negative Renal ROS  negative genitourinary   Musculoskeletal   Abdominal   Peds  Hematology negative hematology ROS (+)   Anesthesia Other Findings Past Medical History: No date: BPH (benign prostatic hyperplasia) No date: COPD (chronic obstructive pulmonary disease) (HCC) No date: Diabetes mellitus without complication (HCC) No date: History of tobacco abuse No date: Hx of colonic polyps No date: Hyperlipidemia No date: Hypertension No date: Microscopic hematuria No date: Obstructive emphysema ([place]) 02/25/2014: Proteinuria No date: Psoriasis No date: Right ovarian cyst No date: Sleep apnea  Past Surgical History: 09/23/2014: COLONOSCOPY WITH PROPOFOL; N/A     Comment:  Procedure: COLONOSCOPY WITH PROPOFOL;  Surgeon: Hulen Luster, MD;  Location: [facility] ENDOSCOPY;  Service:               Gastroenterology;  Laterality: N/A; No date: TOOTH EXTRACTION  BMI    Body Mass Index:  36.96 kg/m      Reproductive/Obstetrics negative OB ROS                             Anesthesia Physical Anesthesia Plan  ASA: III  Anesthesia Plan: General   Post-op  Pain Management:    Induction: Intravenous  PONV Risk Score and Plan: Propofol infusion and TIVA  Airway Management Planned: Natural Airway and Nasal Cannula  Additional Equipment:   Intra-op Plan:   Post-operative Plan:   Informed Consent: I have reviewed the patients History and Physical, chart, labs and discussed the procedure including the risks, benefits and alternatives for the proposed anesthesia with the patient or authorized representative who has indicated his/her understanding and acceptance.   Dental Advisory Given  Plan Discussed with: Anesthesiologist, CRNA and Surgeon  Anesthesia Plan Comments: (Patient declines interpreter   Patient and wife consented for risks of anesthesia including but not limited to:  - adverse reactions to medications - risk of intubation if required - damage to teeth, lips or other oral mucosa - sore throat or hoarseness - Damage to heart, brain, lungs or loss of life  They voiced understanding.)        Anesthesia Quick Evaluation

## 2018-03-27 NOTE — Transfer of Care (Signed)
Immediate Anesthesia Transfer of Care Note  Patient: Dean Simpson  Procedure(s) Performed: COLONOSCOPY WITH PROPOFOL (N/A )  Patient Location: PACU  Anesthesia Type:General  Level of Consciousness: sedated  Airway & Oxygen Therapy: Patient Spontanous Breathing and Patient connected to nasal cannula oxygen  Post-op Assessment: Report given to RN and Post -op Vital signs reviewed and stable  Post vital signs: Reviewed and stable  Last Vitals:  Vitals Value Taken Time  BP 99/74 03/27/2018  9:44 AM  Temp 36.2 C 03/27/2018  9:43 AM  Pulse 57 03/27/2018  9:46 AM  Resp 19 03/27/2018  9:46 AM  SpO2 92 % 03/27/2018  9:46 AM  Vitals shown include unvalidated device data.  Last Pain:  Vitals:   03/27/18 0943  TempSrc: Tympanic  PainSc: 0-No pain         Complications: No apparent anesthesia complications

## 2018-03-27 NOTE — Anesthesia Postprocedure Evaluation (Signed)
Anesthesia Post Note  Patient: Saahil Georgios Alligood  Procedure(s) Performed: COLONOSCOPY WITH PROPOFOL (N/A )  Patient location during evaluation: Endoscopy Anesthesia Type: General Level of consciousness: awake and alert Pain management: pain level controlled Vital Signs Assessment: post-procedure vital signs reviewed and stable Respiratory status: spontaneous breathing, nonlabored ventilation, respiratory function stable and patient connected to nasal cannula oxygen Cardiovascular status: blood pressure returned to baseline and stable Postop Assessment: no apparent nausea or vomiting Anesthetic complications: no     Last Vitals:  Vitals:   03/27/18 0943 03/27/18 0953  BP: 99/74 117/64  Pulse: (!) 58   Resp: 19   Temp: (!) 36.2 C   SpO2: 91%     Last Pain:  Vitals:   03/27/18 1013  TempSrc:   PainSc: 0-No pain                 Precious Haws Corneilus Heggie

## 2018-03-27 NOTE — Anesthesia Post-op Follow-up Note (Signed)
Anesthesia QCDR form completed.        

## 2018-03-27 NOTE — Op Note (Signed)
[facility] Gastroenterology Patient Name: Dean Simpson Procedure Date: 03/27/2018 8:54 AM MRN: [id number] Account #: [id number] Date of Birth: 1950-11-27 Admit Type: Outpatient Age: 67 Room: [facility] ENDO ROOM 3 Gender: Male Note Status: Finalized Procedure:            Colonoscopy Indications:          High risk colon cancer surveillance: Personal history                        of colonic polyps Providers:            Manya Silvas, MD Referring MD:         Ramonita Lab, MD (Referring MD) Medicines:            Propofol per Anesthesia Complications:        No immediate complications. Procedure:            Pre-Anesthesia Assessment:                       - After reviewing the risks and benefits, the patient                        was deemed in satisfactory condition to undergo the                        procedure.                       After obtaining informed consent, the colonoscope was                        passed under direct vision. Throughout the procedure,                        the patient's blood pressure, pulse, and oxygen                        saturations were monitored continuously. The                        Colonoscope was introduced through the anus and                        advanced to the the cecum, identified by appendiceal                        orifice and ileocecal valve. The colonoscopy was                        performed without difficulty. The patient tolerated the                        procedure well. The quality of the bowel preparation                        was good. Findings:      A small polyp was found in the rectum. The polyp was sessile. The polyp       was removed with a hot snare. Resection and retrieval were complete.      A diminutive polyp was found in the rectum. The polyp was sessile. The  polyp was removed with a cold biopsy forceps. Resection and retrieval       were complete.      Three sessile polyps  were found in the descending colon. The polyps were       diminutive in size. These polyps were removed with a jumbo cold forceps.       Resection and retrieval were complete.      A diminutive polyp was found in the hepatic flexure. The polyp was       sessile. The polyp was removed with a jumbo cold forceps. Resection and       retrieval were complete.      A diminutive polyp was found in the cecum. The polyp was sessile. The       polyp was removed with a jumbo cold forceps. Resection and retrieval       were complete.      The exam was otherwise without abnormality. Impression:           - One small polyp in the rectum, removed with a hot                        snare. Resected and retrieved.                       - One diminutive polyp in the rectum, removed with a                        cold biopsy forceps. Resected and retrieved.                       - Three diminutive polyps in the descending colon,                        removed with a jumbo cold forceps. Resected and                        retrieved.                       - One diminutive polyp at the hepatic flexure, removed                        with a jumbo cold forceps. Resected and retrieved.                       - One diminutive polyp in the cecum, removed with a                        jumbo cold forceps. Resected and retrieved.                       - The examination was otherwise normal. Recommendation:       - The findings and recommendations were discussed with                        the patient's family. Manya Silvas, MD 03/27/2018 9:45:12 AM This report has been signed electronically. Number of Addenda: 0 Note Initiated On: 03/27/2018 8:54 AM Scope Withdrawal Time: 0 hours 16 minutes 50 seconds  Total Procedure Duration: 0 hours 21 minutes 19 seconds       [facility]

## 2018-03-27 NOTE — H&P (Signed)
Primary Care Physician:  Adin Hector, MD Primary Gastroenterologist:  Dr. Vira Agar  Pre-Procedure History & Physical: HPI:  Dean Simpson is a 67 y.o. male is here for an colonoscopy.   Past Medical History:  Diagnosis Date  . BPH (benign prostatic hyperplasia)   . COPD (chronic obstructive pulmonary disease) ([place])   . Diabetes mellitus without complication ([place])   . History of tobacco abuse   . Hx of colonic polyps   . Hyperlipidemia   . Hypertension   . Microscopic hematuria   . Obstructive emphysema ([place])   . Proteinuria 02/25/2014  . Psoriasis   . Right ovarian cyst   . Sleep apnea     Past Surgical History:  Procedure Laterality Date  . COLONOSCOPY WITH PROPOFOL N/A 09/23/2014   Procedure: COLONOSCOPY WITH PROPOFOL;  Surgeon: Hulen Luster, MD;  Location: [facility] ENDOSCOPY;  Service: Gastroenterology;  Laterality: N/A;  . TOOTH EXTRACTION      Prior to Admission medications   Medication Sig Start Date End Date Taking? Authorizing Provider  albuterol-ipratropium (COMBIVENT) 18-103 MCG/ACT inhaler Inhale into the lungs every 4 (four) hours.   Yes [provider]  amLODipine (NORVASC) 10 MG tablet Take 10 mg by mouth daily.   Yes [provider]  aspirin EC 81 MG tablet Take 81 mg by mouth daily.   Yes [provider]  atorvastatin (LIPITOR) 80 MG tablet Take 80 mg by mouth daily.   Yes [provider]  canagliflozin (INVOKANA) 300 MG TABS tablet Take 300 mg by mouth daily before breakfast.   Yes [provider]  cloNIDine (CATAPRES) 0.1 MG tablet Take 0.1 mg by mouth 2 (two) times daily.   Yes [provider]  doxazosin (CARDURA) 4 MG tablet Take 4 mg by mouth daily.   Yes [provider]  fluocinonide-emollient (LIDEX-E) 0.05 % cream Apply 1 application topically 2 (two) times daily.   Yes [provider]  Fluticasone-Salmeterol (ADVAIR) 500-50 MCG/DOSE AEPB Inhale 1 puff into the lungs  2 (two) times daily.   Yes [provider]  glimepiride (AMARYL) 4 MG tablet Take 4 mg by mouth daily with breakfast.   Yes [provider]  hydrOXYzine (ATARAX/VISTARIL) 25 MG tablet Take 25 mg by mouth 3 (three) times daily.   Yes [provider]  metFORMIN (GLUCOPHAGE) 500 MG tablet Take by mouth 2 (two) times daily with a meal.   Yes [provider]  metoprolol succinate (TOPROL-XL) 100 MG 24 hr tablet Take 100 mg by mouth daily. Take with or immediately following a meal.   Yes [provider]  nystatin ointment (MYCOSTATIN) Apply 1 application topically as needed.   Yes [provider]  valsartan-hydrochlorothiazide (DIOVAN-HCT) 320-25 MG per tablet Take 1 tablet by mouth daily.   Yes [provider]    Allergies as of 02/27/2018 - Review Complete 09/23/2014  Allergen Reaction Noted  . Iodine  09/20/2014    Family History  Problem Relation Age of Onset  . Pancreatic cancer Father   . Hypertension Father   . Leukemia Son     Social History   Socioeconomic History  . Marital status: Married    Spouse name: Not on file  . Number of children: Not on file  . Years of education: Not on file  . Highest education level: Not on file  Occupational History  . Not on file  Social Needs  . Financial resource strain: Not on file  . Food  insecurity:    Worry: Not on file    Inability: Not on file  . Transportation needs:    Medical: Not on file    Non-medical: Not on file  Tobacco Use  . Smoking status: Former Research scientist (life sciences)  . Smokeless tobacco: Never Used  Substance and Sexual Activity  . Alcohol use: Never    Frequency: Never  . Drug use: Never  . Sexual activity: Not on file  Lifestyle  . Physical activity:    Days per week: Not on file    Minutes per session: Not on file  . Stress: Not on file  Relationships  . Social connections:    Talks on phone: Not on file    Gets together: Not on file    Attends religious  service: Not on file    Active member of club or organization: Not on file    Attends meetings of clubs or organizations: Not on file    Relationship status: Not on file  . Intimate partner violence:    Fear of current or ex partner: Not on file    Emotionally abused: Not on file    Physically abused: Not on file    Forced sexual activity: Not on file  Other Topics Concern  . Not on file  Social History Narrative  . Not on file    Review of Systems: See HPI, otherwise negative ROS  Physical Exam: BP 132/62   Pulse 63   Temp (!) 96.7 F (35.9 C) (Tympanic)   Resp 16   Ht 5\' 6"  (1.676 m)   Wt 103.9 kg   SpO2 94%   BMI 36.96 kg/m  General:   Alert,  pleasant and cooperative in NAD Head:  Normocephalic and atraumatic. Neck:  Supple; no masses or thyromegaly. Lungs:  Clear throughout to auscultation.    Heart:  Regular rate and rhythm. Abdomen:  Soft, nontender and nondistended. Normal bowel sounds, without guarding, and without rebound.   Neurologic:  Alert and  oriented x4;  grossly normal neurologically.  Impression/Plan: Dean Simpson is here for an colonoscopy to be performed for evaluation as last colonoscopy was  09/23/2014.  Risks, benefits, limitations, and alternatives regarding  colonoscopy have been reviewed with the patient.  Questions have been answered.  All parties agreeable.   Gaylyn Cheers, MD  03/27/2018, 9:05 AM

## 2018-03-28 LAB — SURGICAL PATHOLOGY

## 2018-08-10 ENCOUNTER — Telehealth: Payer: Self-pay | Admitting: *Deleted

## 2018-08-10 NOTE — Telephone Encounter (Signed)
Received referral for lung screening scan, contacted spouse and explained that patient will be contacted to schedule lung screening when current restrictions are lifted.

## 2018-09-07 ENCOUNTER — Telehealth: Payer: Self-pay | Admitting: *Deleted

## 2018-09-07 NOTE — Telephone Encounter (Signed)
Left voicemail in attempt to reschedule lung screening scan.

## 2018-09-08 ENCOUNTER — Telehealth: Payer: Self-pay | Admitting: *Deleted

## 2018-09-08 DIAGNOSIS — Z122 Encounter for screening for malignant neoplasm of respiratory organs: Secondary | ICD-10-CM

## 2018-09-08 DIAGNOSIS — Z87891 Personal history of nicotine dependence: Secondary | ICD-10-CM

## 2018-09-08 NOTE — Telephone Encounter (Signed)
Received referral for initial lung cancer screening scan. Contacted patient and obtained smoking history,(former, quit 2010, 82 pack year) as well as answering questions related to screening process. Patient denies signs of lung cancer such as weight loss or hemoptysis. Patient denies comorbidity that would prevent curative treatment if lung cancer were found. Patient is scheduled for shared decision making visit and CT scan on 09/18/18 at 11am.

## 2018-09-18 ENCOUNTER — Other Ambulatory Visit: Payer: Self-pay

## 2018-09-18 ENCOUNTER — Encounter: Payer: Self-pay | Admitting: Oncology

## 2018-09-18 ENCOUNTER — Ambulatory Visit
Admission: RE | Admit: 2018-09-18 | Discharge: 2018-09-18 | Disposition: A | Discharge status: Home / Self Care | Payer: Medicare Other | Source: Ambulatory Visit | Attending: Nurse Practitioner | Admitting: Nurse Practitioner

## 2018-09-18 ENCOUNTER — Inpatient Hospital Stay: Payer: Medicare Other | Attending: Nurse Practitioner | Admitting: Oncology

## 2018-09-18 DIAGNOSIS — Z87891 Personal history of nicotine dependence: Secondary | ICD-10-CM | POA: Insufficient documentation

## 2018-09-18 DIAGNOSIS — Z122 Encounter for screening for malignant neoplasm of respiratory organs: Secondary | ICD-10-CM | POA: Insufficient documentation

## 2018-09-18 IMAGING — CT CT CHEST LUNG CANCER SCREENING LOW DOSE
2 of 5 series · 15 of 40 positions shown, 18 images · non-contrast
Comparison: CT chest dated [DATE]

CLINICAL DATA: 67-year-old former smoker, quit 10 years ago, with
82 pack-year history of smoking, for initial lung cancer screening

EXAM:
CT CHEST WITHOUT CONTRAST LOW-DOSE FOR LUNG CANCER SCREENING
TECHNIQUE: Multidetector CT imaging of the chest was performed following the
standard protocol without IV contrast.

[Series 3: lung · axial · 0.77mm/px · z∈[-1227,-887]mm · 12 of 375 slices shown, 15 images (1 of 2)]
[im 18/375  mediastinal]
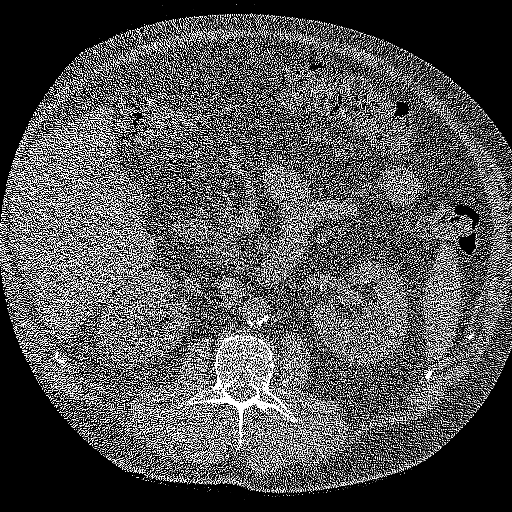
[im 18/375  lung]
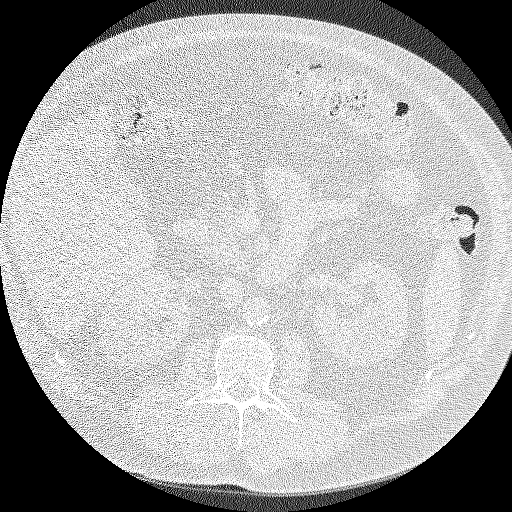
[im 52/375  lung]
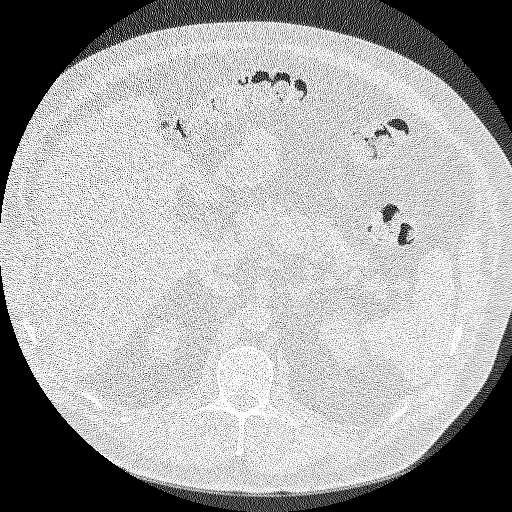
[im 86/375  lung]
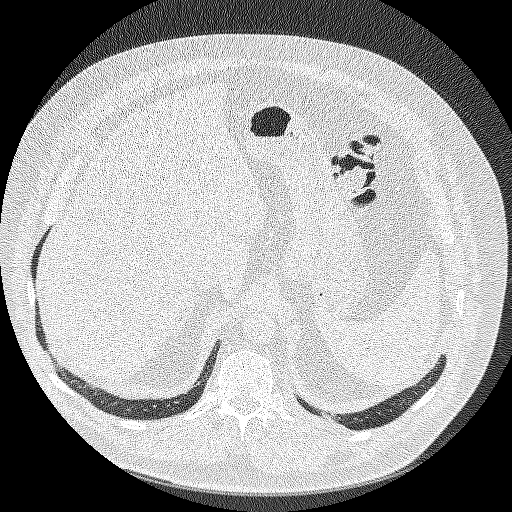
[im 120/375  lung]
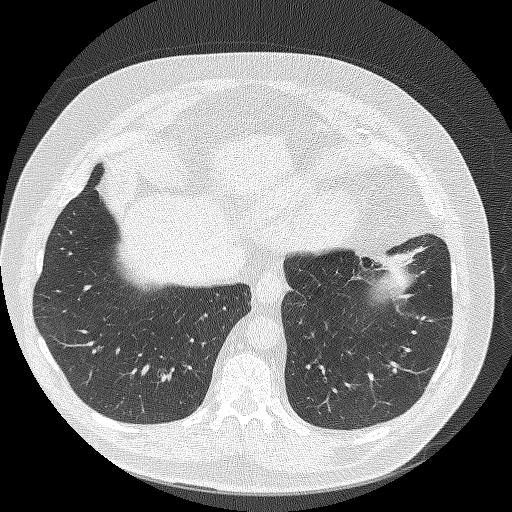
[im 137/375  mediastinal]
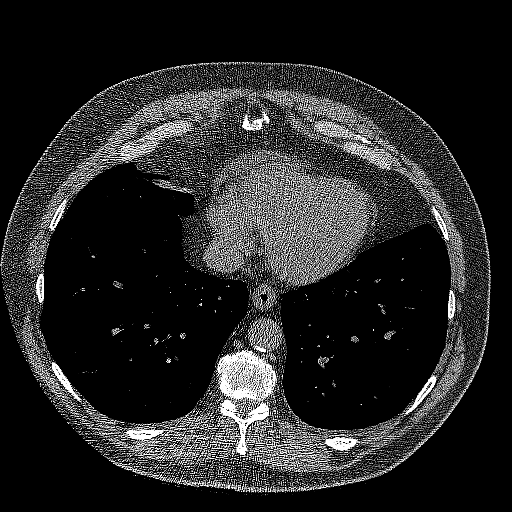
[im 137/375  lung]
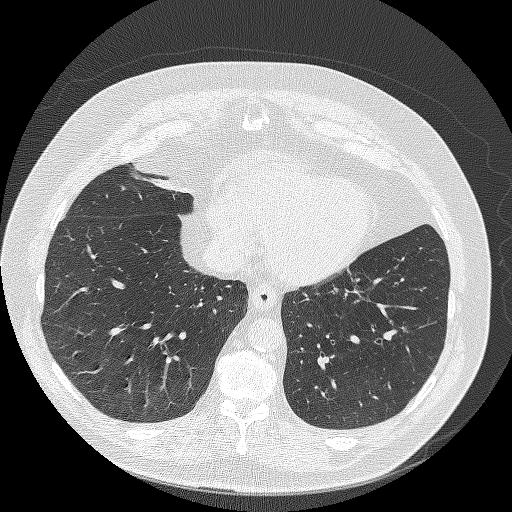
[im 171/375  lung]
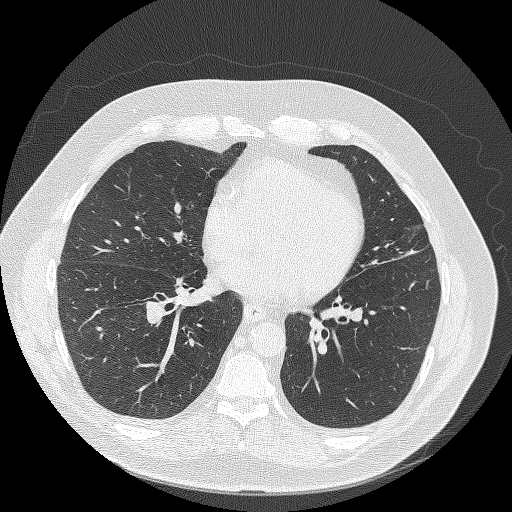
[im 205/375  lung]
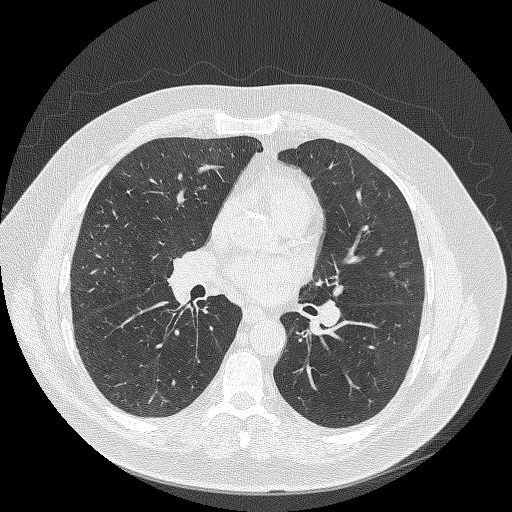
[im 239/375  lung]
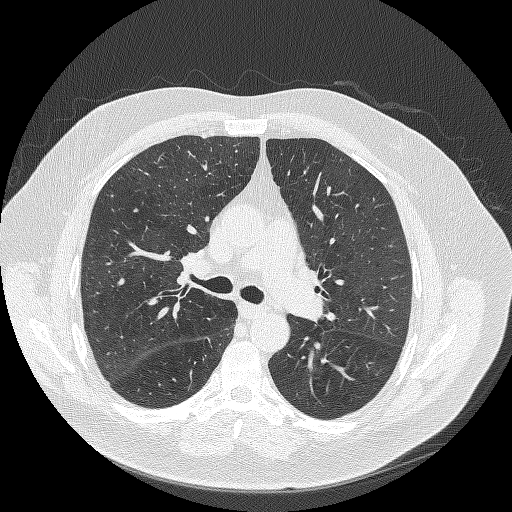
[im 256/375  mediastinal]
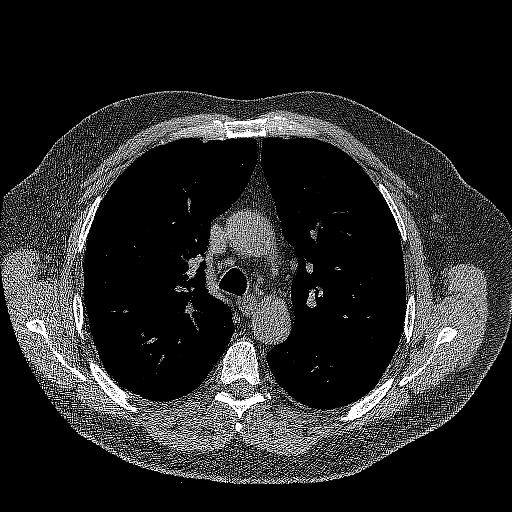
[im 256/375  lung]
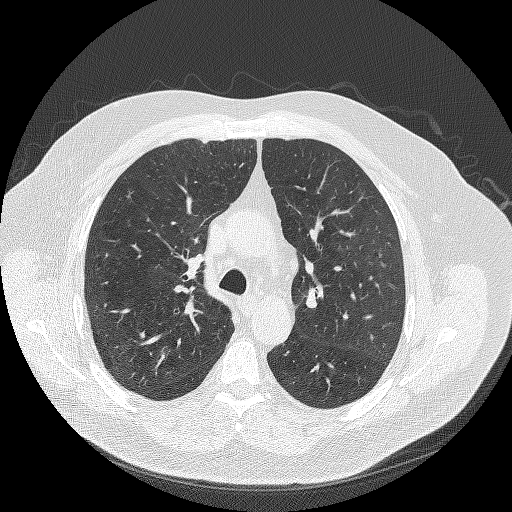
[im 290/375  lung]
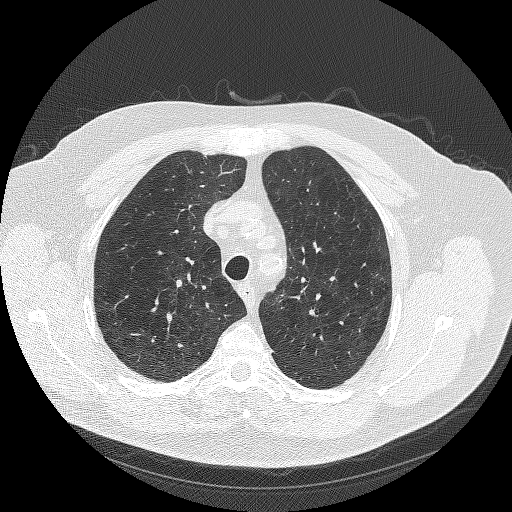
[im 324/375  lung]
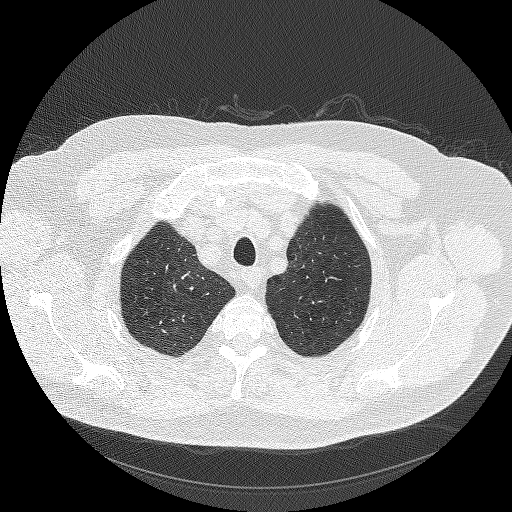
[im 358/375  lung]
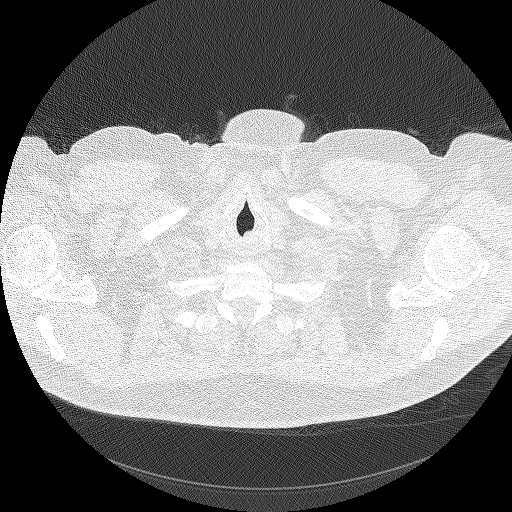

[Series 4: lung · coronal · 0.73mm/px · 3 of 371 slices shown (2 of 2)]
[im 75/371  lung]
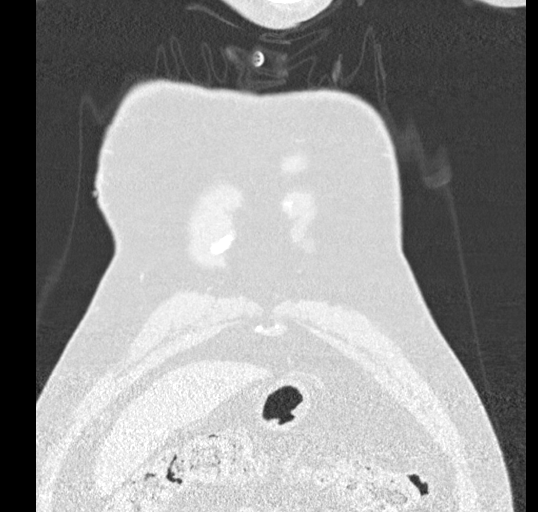
[im 149/371  lung]
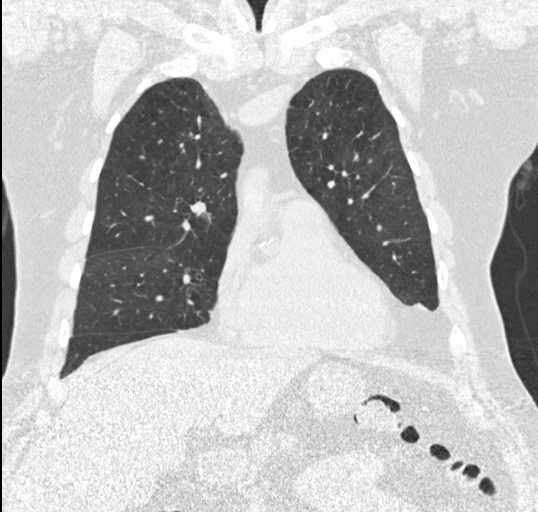
[im 223/371  lung]
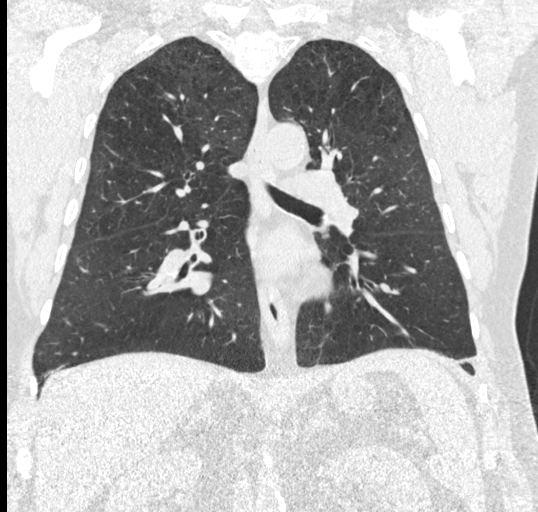

[15 of 40 positions shown; findings below may reference images not displayed]

FINDINGS: Cardiovascular: The heart is normal in size. No pericardial
effusion.

No evidence of thoracic aortic aneurysm. Atherosclerotic
calcifications of the aortic arch.

Coronary atherosclerosis of the LAD and right coronary artery.

Mediastinum/Nodes: Small mediastinal lymph nodes which do not meet
pathologic CT size criteria.

Visualized thyroid is unremarkable.

Lungs/Pleura: Moderate centrilobular and paraseptal emphysematous
changes, upper lobe predominant.

No focal consolidation.

Mild scarring in the medial right middle lobe and lingula. Mild
scarring/atelectasis in the lateral left lower lobe/left lung base.

Small bilateral pulmonary nodules, measuring up to 3.6 mm in the
posterior right upper lobe.

No pleural effusion or pneumothorax.

Upper Abdomen: Visualized upper abdomen is notable for left renal
cysts and vascular calcifications.

Musculoskeletal: Visualized osseous structures are within normal
limits.
IMPRESSION: Lung-RADS 2, benign appearance or behavior. Continue annual
screening with low-dose chest CT without contrast in 12 months.

Aortic Atherosclerosis ([30]-[30]) and Emphysema ([30]-[30]).

## 2018-09-18 NOTE — Progress Notes (Signed)
In accordance with CMS guidelines, patient has met eligibility criteria including age, absence of signs or symptoms of lung cancer.  Social History   Tobacco Use  . Smoking status: Former Smoker    Packs/day: 2.00    Years: 41.00    Pack years: 82.00    Types: Cigarettes    Last attempt to quit: 2010    Years since quitting: 10.4  . Smokeless tobacco: Never Used  Substance Use Topics  . Alcohol use: Never    Frequency: Never  . Drug use: Never     A shared decision-making session was conducted prior to the performance of CT scan. This includes one or more decision aids, includes benefits and harms of screening, follow-up diagnostic testing, over-diagnosis, false positive rate, and total radiation exposure.  Counseling on the importance of adherence to annual lung cancer LDCT screening, impact of co-morbidities, and ability or willingness to undergo diagnosis and treatment is imperative for compliance of the program.  Counseling on the importance of continued smoking cessation for former smokers; the importance of smoking cessation for current smokers, and information about tobacco cessation interventions have been given to patient including [place] and [facility] quit [place] programs.  Written order for lung cancer screening with LDCT has been given to the patient and any and all questions have been answered to the best of my abilities.   Yearly follow up will be coordinated by Burgess Estelle, Thoracic Navigator.  Faythe Casa, NP 09/18/2018 11:24 AM

## 2018-09-20 ENCOUNTER — Encounter: Payer: Self-pay | Admitting: *Deleted

## 2019-09-10 ENCOUNTER — Telehealth: Payer: Self-pay

## 2019-09-10 NOTE — Telephone Encounter (Signed)
Spike with patients wife regarding the low dose lung cancer screening CT scan.  She will take with Dean Simpson and have him return call to Burgess Estelle at [phone] to verify information prior to CT scan being scheduled.

## 2019-10-16 ENCOUNTER — Telehealth: Payer: Self-pay | Admitting: *Deleted

## 2019-10-16 NOTE — Telephone Encounter (Signed)
(  10/16/2019) Left message for pt to notify them that it is time to schedule annual low dose lung cancer screening CT scan. Instructed patient to call back to verify information prior to the scan being scheduled SRW

## 2019-10-30 ENCOUNTER — Telehealth: Payer: Self-pay

## 2019-10-30 NOTE — Telephone Encounter (Signed)
Contacted patient to schedule his annual lung screening CT scan.  Patient's last scan with June of 2020.  Message left for patient to call Burgess Estelle, lung navigator to schedule scan.

## 2020-01-25 ENCOUNTER — Encounter: Payer: Self-pay | Admitting: *Deleted

## 2020-02-13 ENCOUNTER — Telehealth: Payer: Self-pay | Admitting: *Deleted

## 2020-02-13 DIAGNOSIS — Z122 Encounter for screening for malignant neoplasm of respiratory organs: Secondary | ICD-10-CM

## 2020-02-13 DIAGNOSIS — Z87891 Personal history of nicotine dependence: Secondary | ICD-10-CM

## 2020-02-13 NOTE — Telephone Encounter (Signed)
Contacted and scheduled. Former smoker, quit 2010, 82 pack year history

## 2020-03-03 ENCOUNTER — Ambulatory Visit: Admission: RE | Admit: 2020-03-03 | Payer: Medicare Other | Source: Ambulatory Visit

## 2020-04-09 ENCOUNTER — Telehealth: Payer: Self-pay | Admitting: *Deleted

## 2020-04-09 NOTE — Telephone Encounter (Signed)
Attempted to contact and schedule lung screening scan. Message left for patient to call back to schedule. 

## 2020-06-11 ENCOUNTER — Telehealth: Payer: Self-pay | Admitting: *Deleted

## 2020-06-11 NOTE — Telephone Encounter (Signed)
Attempted to contact and schedule patient for annual lung screeing, left message for patient to call for scheduling [phone].

## 2021-01-26 ENCOUNTER — Other Ambulatory Visit: Payer: Self-pay | Admitting: Internal Medicine

## 2021-01-26 DIAGNOSIS — M5000 Cervical disc disorder with myelopathy, unspecified cervical region: Secondary | ICD-10-CM

## 2021-02-09 ENCOUNTER — Ambulatory Visit
Admission: RE | Admit: 2021-02-09 | Discharge: 2021-02-09 | Disposition: A | Discharge status: Home / Self Care | Payer: Medicare Other | Source: Ambulatory Visit | Attending: Internal Medicine | Admitting: Internal Medicine

## 2021-02-09 DIAGNOSIS — M5000 Cervical disc disorder with myelopathy, unspecified cervical region: Secondary | ICD-10-CM | POA: Insufficient documentation

## 2021-02-09 IMAGING — MR MR CERVICAL SPINE W/O CM
6 series · 35 of 48 positions shown · non-contrast
Comparison: None.

CLINICAL DATA: Cervical disc disease with myelopathy.

EXAM:
MRI CERVICAL SPINE WITHOUT CONTRAST
TECHNIQUE: Multiplanar, multisequence MR imaging of the cervical spine was
performed. No intravenous contrast was administered.

[Series 5: T2 · sagittal · 3.0mm · 0.62mm/px · 5 of 15 slices shown (1 of 2)]
[im 1/15]
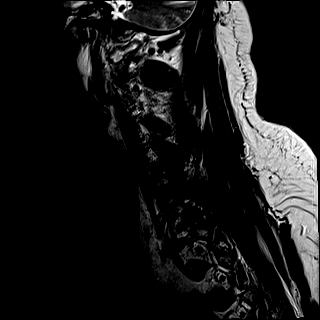
[im 4/15]
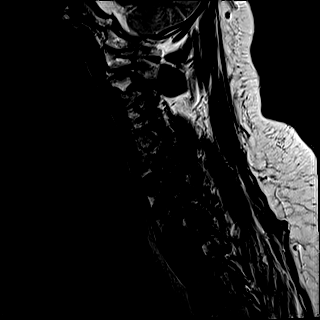
[im 8/15]
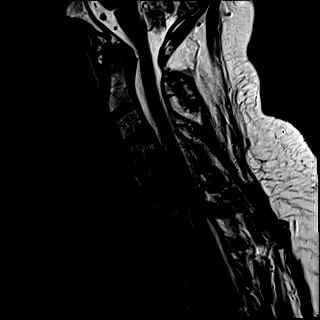
[im 11/15]
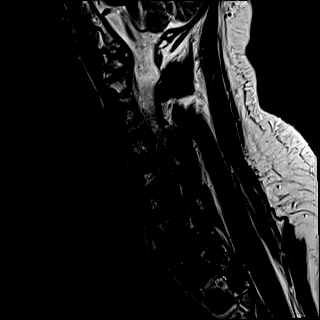
[im 15/15]
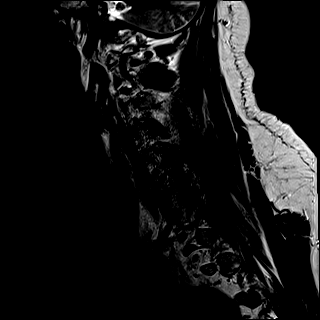

[Series 6: FLAIR · sagittal · 3.0mm · 0.78mm/px · 5 of 15 slices shown]
[im 1/15]
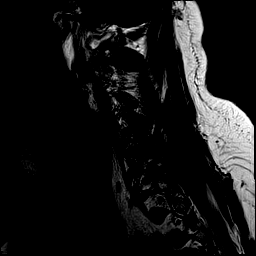
[im 4/15]
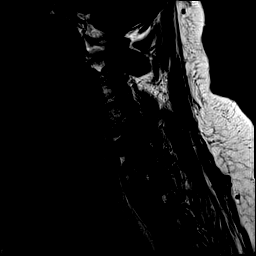
[im 8/15]
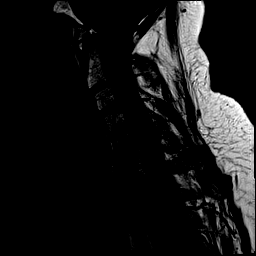
[im 11/15]
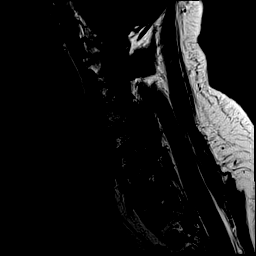
[im 15/15]
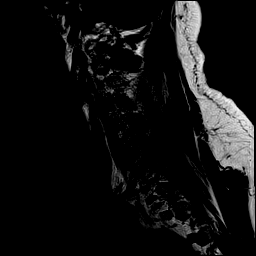

[Series 7: STIR · sagittal · 3.0mm · 0.62mm/px · 5 of 15 slices shown]
[im 1/15]
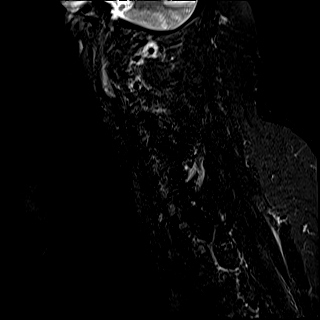
[im 4/15]
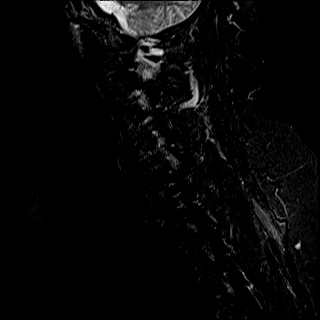
[im 8/15]
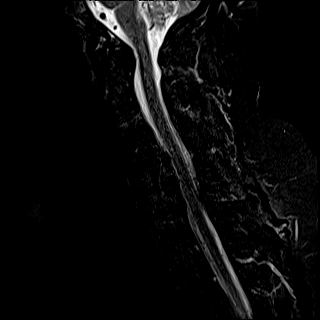
[im 11/15]
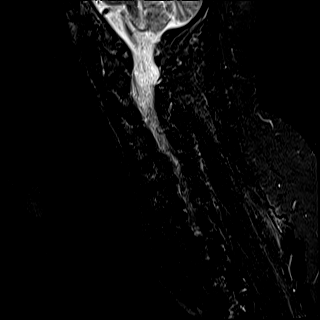
[im 15/15]
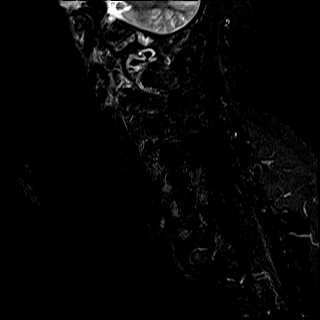

[Series 8: T2 · axial · 3.0mm · 0.70mm/px · z∈[-127,-34]mm · 11 of 29 slices shown (2 of 2)]
[im 1/29]
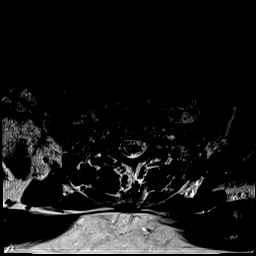
[im 3/29]
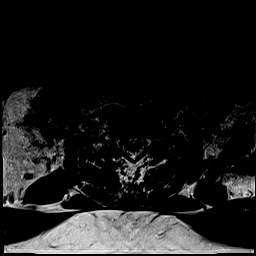
[im 6/29]
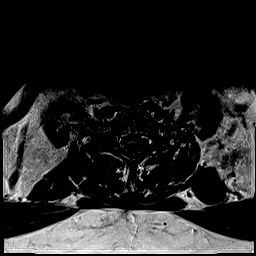
[im 9/29]
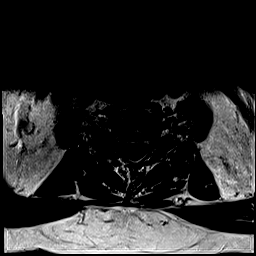
[im 12/29]
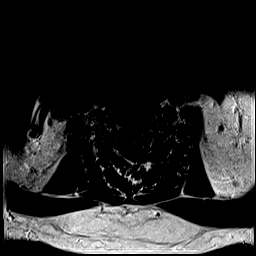
[im 15/29]
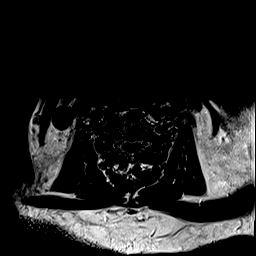
[im 17/29]
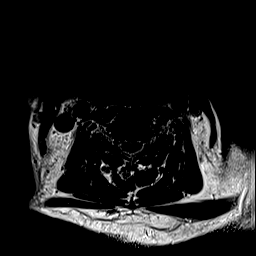
[im 20/29]
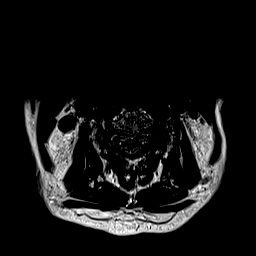
[im 23/29]
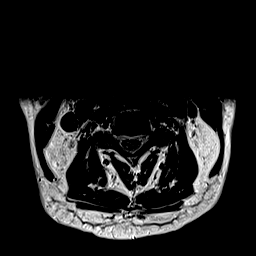
[im 26/29]
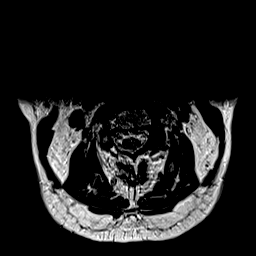
[im 29/29]
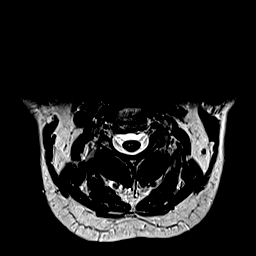

[Series 9: ax mpgr · axial · 3.0mm · 0.35mm/px · z∈[-127,-34]mm · 8 of 29 slices shown (1 of 2)]
[im 1/29]
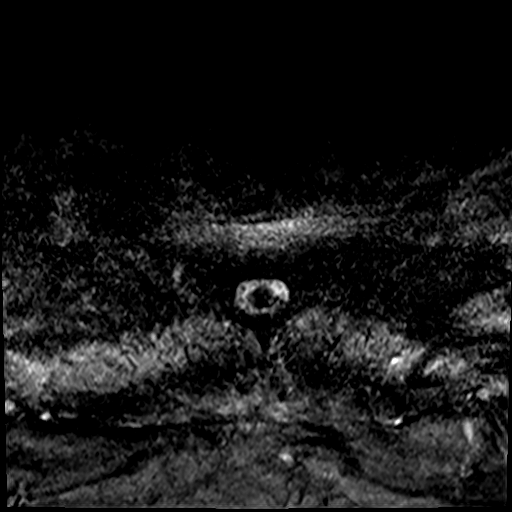
[im 6/29]
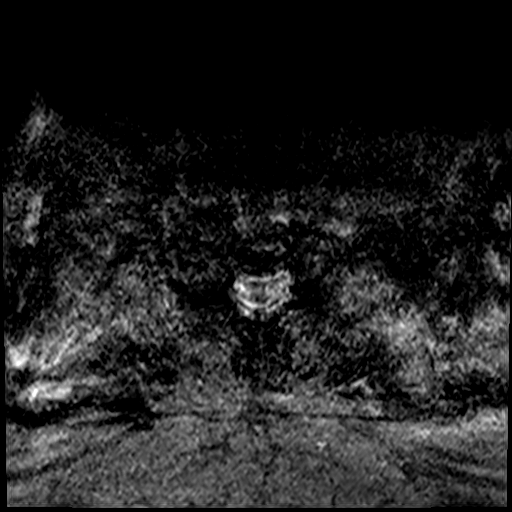
[im 9/29]
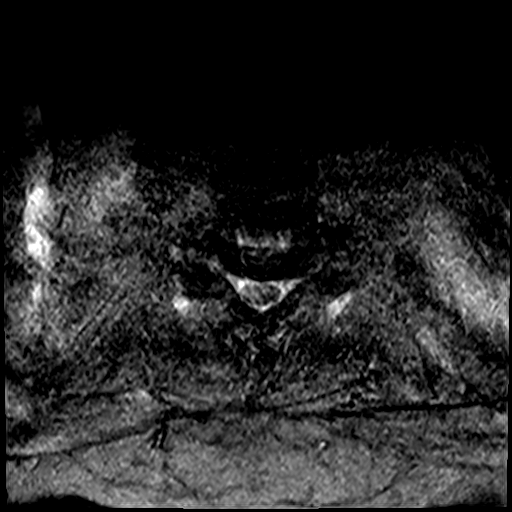
[im 12/29]
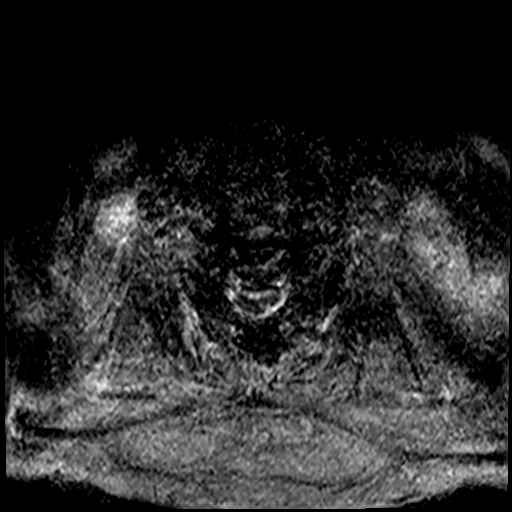
[im 17/29]
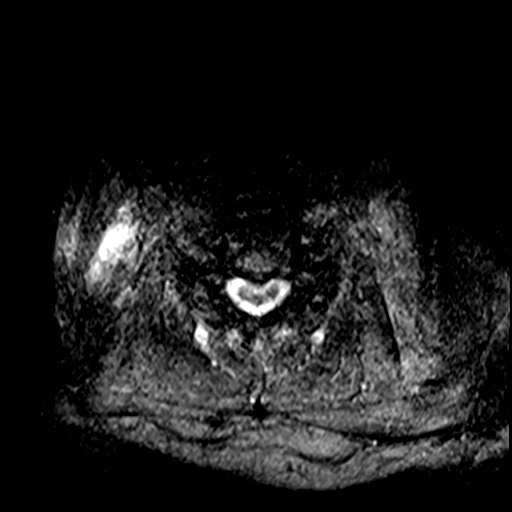
[im 20/29]
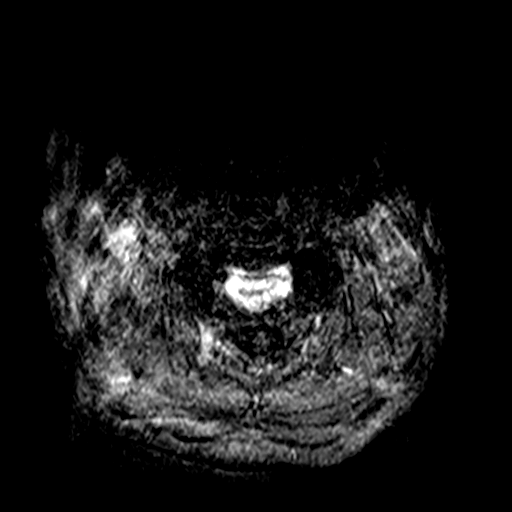
[im 23/29]
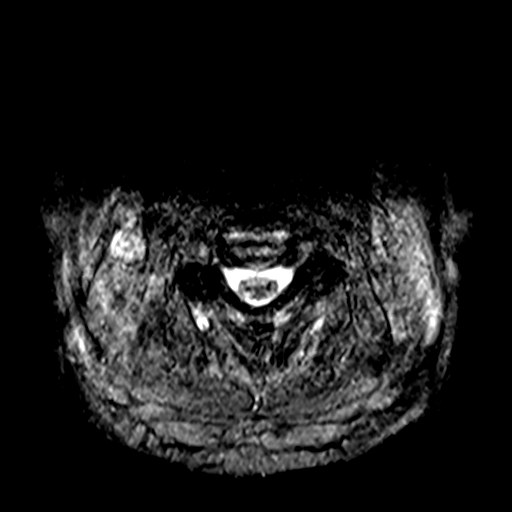
[im 29/29]
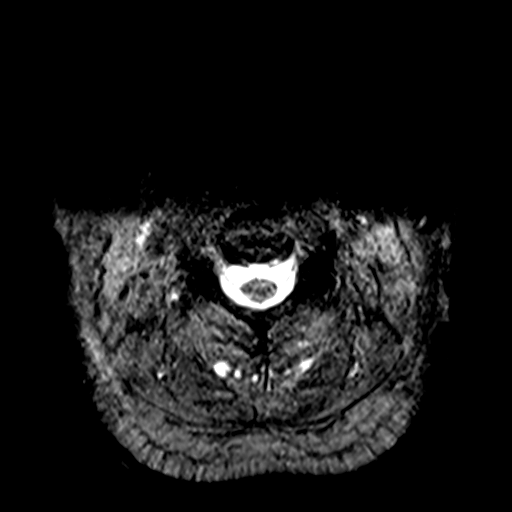

[Series 10: ax mpgr · axial · 3.0mm · 0.35mm/px · 1 of 29 slices shown (2 of 2)]
[im 1/29]
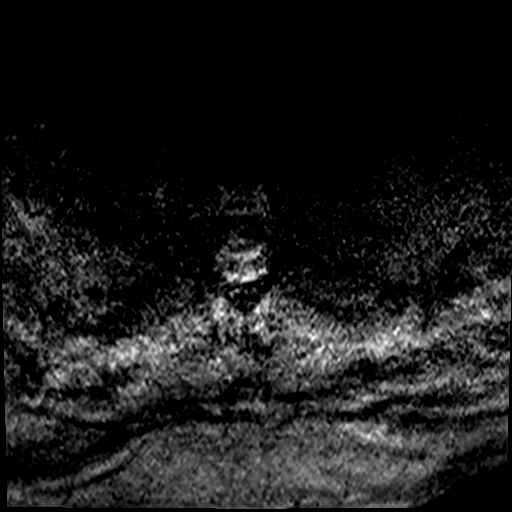

[35 of 48 positions shown; findings below may reference images not displayed]

FINDINGS: The study is degraded by motion.

Alignment: Mild reversal of the cervical curvature. Small
anterolisthesis of C3 over C4 and small retrolisthesis of C6 over
C7.

Vertebrae: No fracture, evidence of discitis, or bone lesion.

Cord: Evaluation is limited by motion artifact. No gross cord signal
abnormality.

Posterior Fossa, vertebral arteries, paraspinal tissues: Negative.

Disc levels:

C1-2: Soft tissue thickening posterior to the odontoid process
resulting in mild narrowing of the spinal canal at this level.

C2-3: No spinal canal or neural foraminal stenosis.

C3-4: Facet degenerative changes. No significant spinal canal or
neural foraminal stenosis.

C4-5: Posterior disc protrusion and facet degenerative changes
without significant spinal canal or neural foraminal stenosis.

C5-6: Posterior disc osteophyte complex resulting mild spinal canal
stenosis. Uncovertebral and facet degenerative changes resulting in
severe bilateral neural foraminal narrowing.

C6-7: Posterior disc osteophyte complex resulting mild spinal canal
stenosis. Uncovertebral and facet degenerative changes resulting in
moderate to severe bilateral neural foraminal narrowing.

C7-T1: Facet degenerative changes. No significant spinal canal or
neural foraminal stenosis.
IMPRESSION: 1. Degenerative changes of the cervical spine with mild spinal canal
stenosis at C5-6 and C6-7.
2. Severe bilateral neural foraminal narrowing at C5-6.
3. Moderate to severe bilateral neural foraminal narrowing at C6-7.

## 2021-03-02 ENCOUNTER — Other Ambulatory Visit: Payer: Self-pay | Admitting: Family Medicine

## 2021-03-02 ENCOUNTER — Ambulatory Visit
Admission: RE | Admit: 2021-03-02 | Discharge: 2021-03-02 | Disposition: A | Discharge status: Home / Self Care | Payer: Medicare Other | Source: Ambulatory Visit | Attending: Family Medicine | Admitting: Family Medicine

## 2021-03-02 DIAGNOSIS — M5416 Radiculopathy, lumbar region: Secondary | ICD-10-CM | POA: Insufficient documentation

## 2021-03-02 IMAGING — MR MR LUMBAR SPINE W/O CM
5 series · 31 of 48 positions shown · non-contrast
Comparison: None.

CLINICAL DATA: Low back pain with bilateral radiculopathy for 3
months. No known injury.

EXAM:
MRI LUMBAR SPINE WITHOUT CONTRAST
TECHNIQUE: Multiplanar, multisequence MR imaging of the lumbar spine was
performed. No intravenous contrast was administered.

[Series 5: T2 · sagittal · 4.0mm · 0.81mm/px · 7 of 17 slices shown (1 of 2)]
[im 1/17]
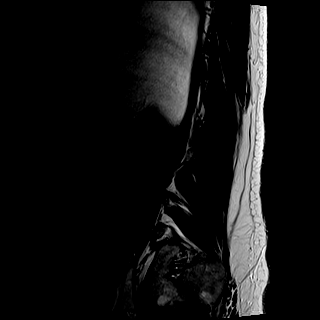
[im 3/17]
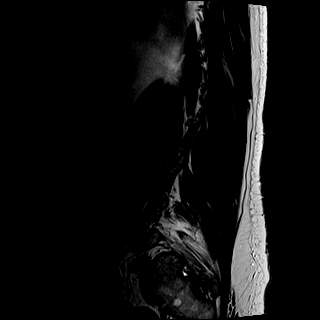
[im 6/17]
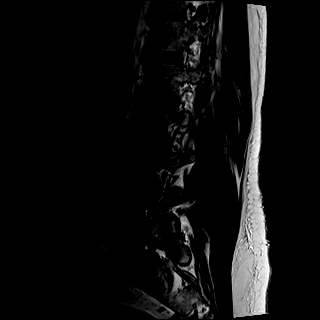
[im 9/17]
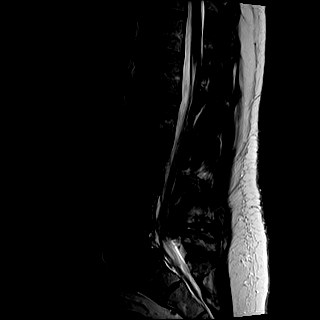
[im 11/17]
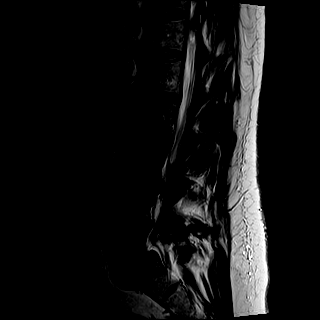
[im 14/17]
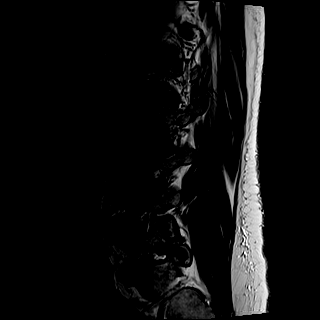
[im 17/17]
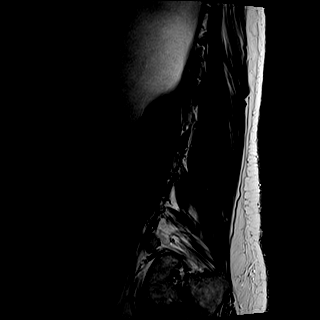

[Series 6: T1 · sagittal · 4.0mm · 0.81mm/px · 7 of 17 slices shown (1 of 2)]
[im 1/17]
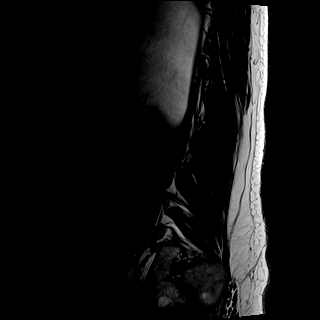
[im 3/17]
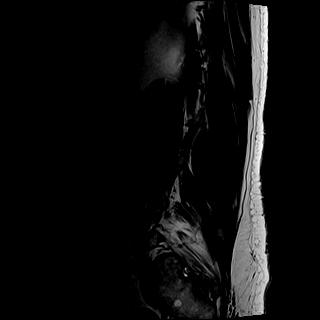
[im 6/17]
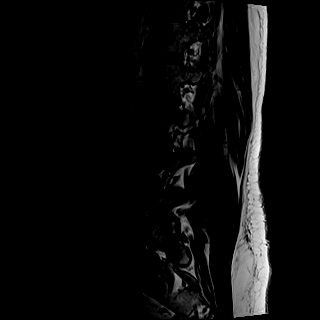
[im 9/17]
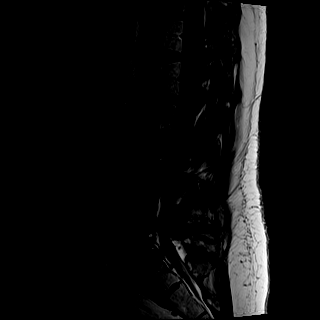
[im 11/17]
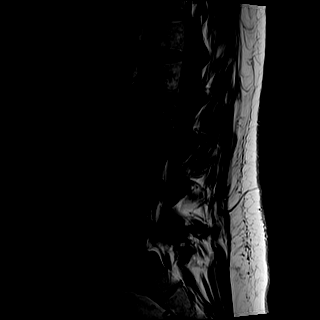
[im 14/17]
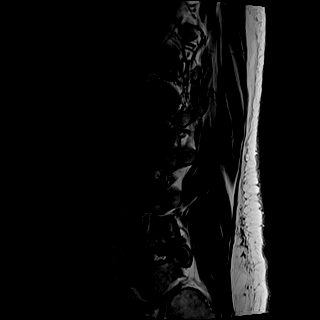
[im 17/17]
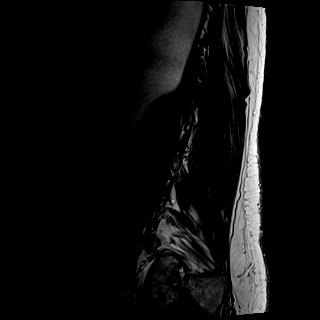

[Series 7: STIR · sagittal · 4.0mm · 0.41mm/px · 1 of 17 slices shown]
[im 1/17]
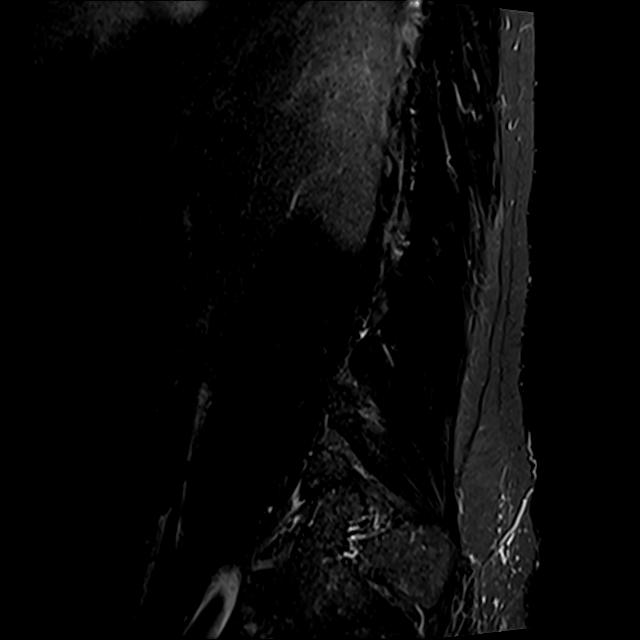

[Series 8: T2 · axial · 4.0mm · 0.78mm/px · z∈[-148,+76]mm · 8 of 38 slices shown (2 of 2)]
[im 1/38]
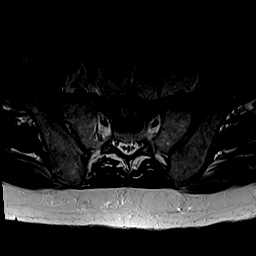
[im 6/38]
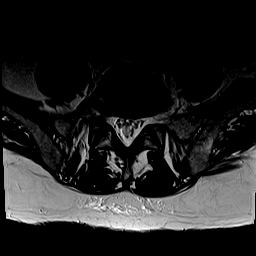
[im 12/38]
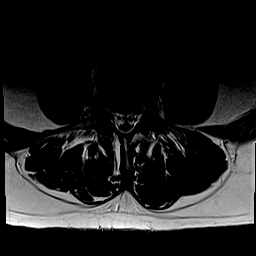
[im 18/38]
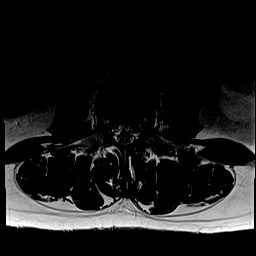
[im 20/38]
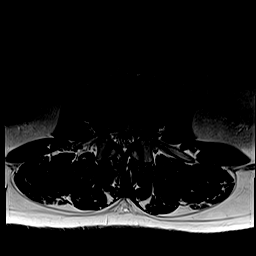
[im 26/38]
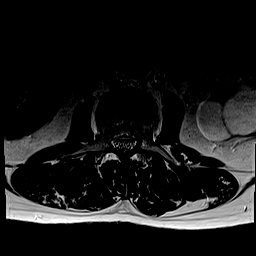
[im 32/38]
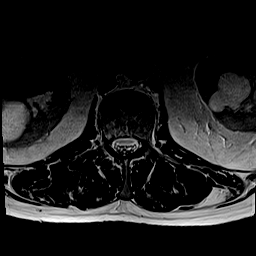
[im 38/38]
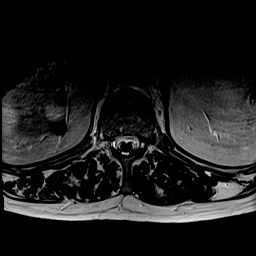

[Series 9: T1 · axial · 4.0mm · 0.39mm/px · z∈[-148,+76]mm · 8 of 38 slices shown (2 of 2)]
[im 1/38]
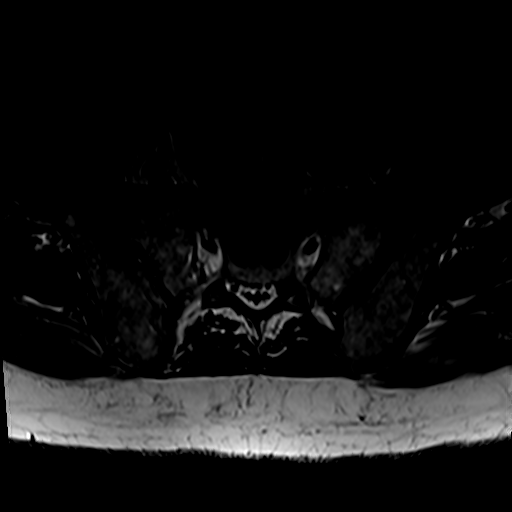
[im 6/38]
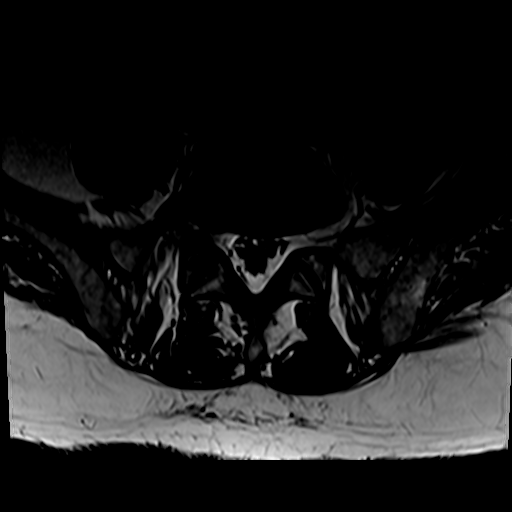
[im 12/38]
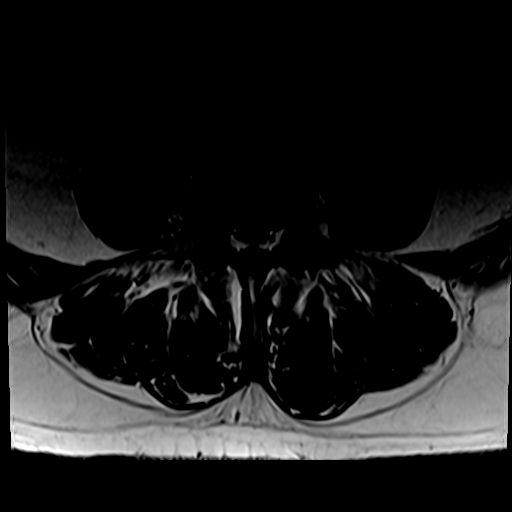
[im 18/38]
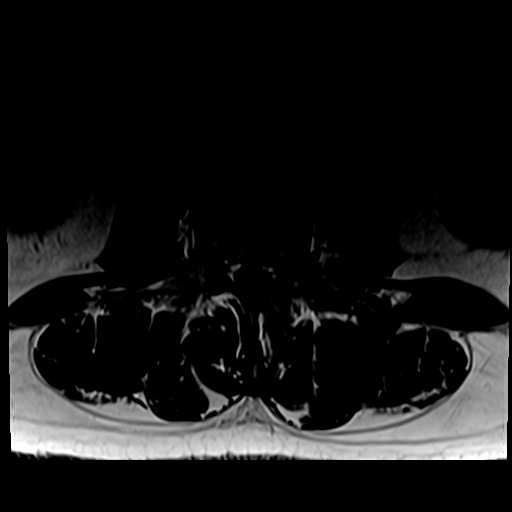
[im 20/38]
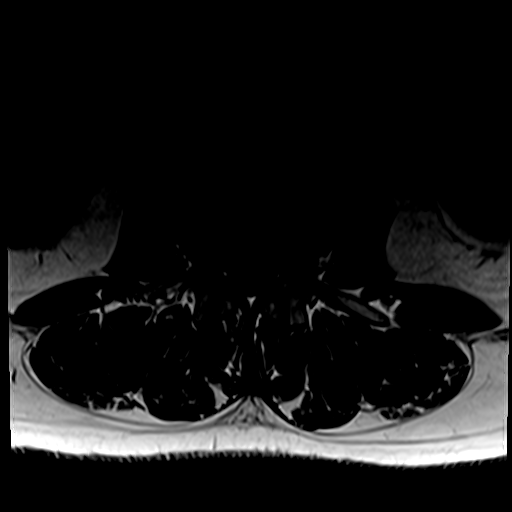
[im 26/38]
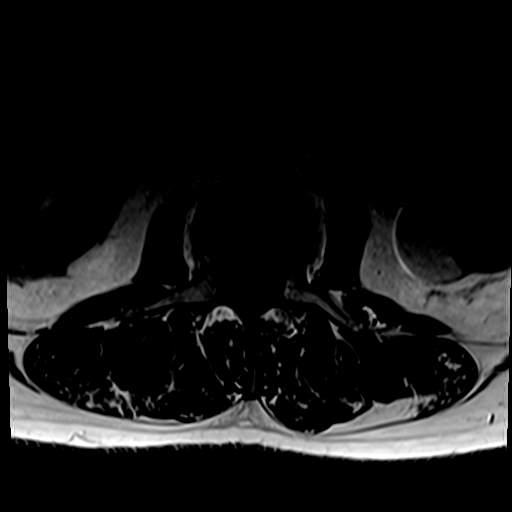
[im 32/38]
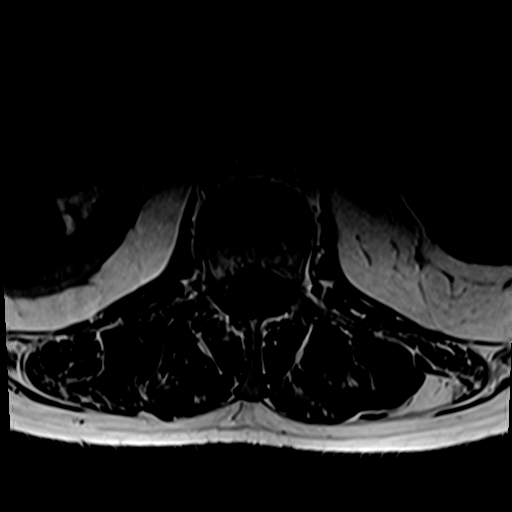
[im 38/38]
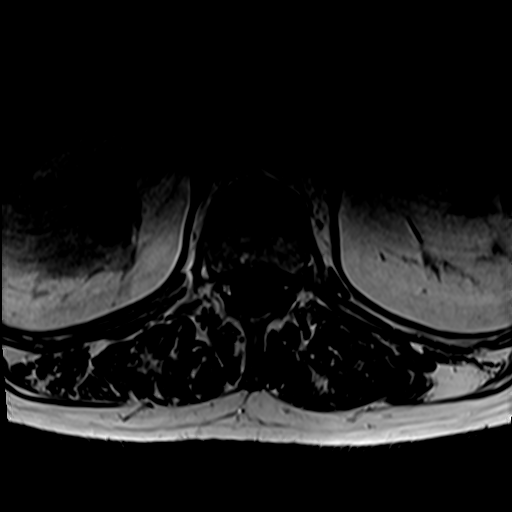

[31 of 48 positions shown; findings below may reference images not displayed]

FINDINGS: Segmentation:  Standard.

Alignment: 5 mm grade 1 anterolisthesis L5 on S1 secondary to
chronic bilateral L5 pars interarticularis defects. Trace
retrolisthesis at L3-4 and L4-5.

Vertebrae: Chronic bilateral L5 pars interarticularis defects. No
acute fracture. No evidence of discitis. No suspicious bone lesion.

Conus medullaris and cauda equina: Conus extends to the L1 level.
Conus and cauda equina appear normal.

Paraspinal and other soft tissues: Multiple bilateral probable renal
cysts, partially visualized 11 mm T2 hypointense lesion emanating
along the posteromedial aspect of the upper pole of the right kidney
(series 8, image 1).

Disc levels:

T12-L1: No significant disc protrusion, foraminal stenosis, or canal
stenosis.

L1-L2: Minimal annular disc bulge.  No foraminal or canal stenosis.

L2-L3: Annular disc bulge and mild bilateral facet arthropathy. No
foraminal or canal stenosis.

L3-L4: Broad-based disc bulge with mild bilateral facet arthropathy.
Findings result in mild canal stenosis with mild left and
mild-moderate right foraminal stenosis.

L4-L5: Diffuse disc bulge and mild bilateral facet arthropathy.
Findings result in mild canal stenosis with severe bilateral
foraminal stenosis.

L5-S1: Anterolisthesis with disc uncovering. Moderate to severe
right and mild left foraminal stenosis. No canal stenosis.
IMPRESSION: 1. Multilevel lumbar spondylosis, as described above. Findings are
most pronounced at L4-5 where there is mild canal stenosis and
severe bilateral foraminal stenosis.
2. Chronic bilateral L5 pars interarticularis defects with 5 mm
grade 1 anterolisthesis at L5-S1. Moderate to severe right and mild
left foraminal stenosis at this level.
3. Mild canal stenosis and mild-to-moderate right foraminal stenosis
at L3-4.
4. Partially visualized 11 mm T2 hypointense lesion emanating along
the posteromedial aspect of the upper pole of the right kidney,
which may reflect a small hemorrhagic renal cyst. Solid renal lesion
not excluded. Recommend further evaluation with renal ultrasound.

## 2021-03-03 ENCOUNTER — Other Ambulatory Visit (HOSPITAL_BASED_OUTPATIENT_CLINIC_OR_DEPARTMENT_OTHER): Payer: Self-pay | Admitting: Internal Medicine

## 2021-03-03 ENCOUNTER — Other Ambulatory Visit: Payer: Self-pay | Admitting: Internal Medicine

## 2021-03-03 DIAGNOSIS — N281 Cyst of kidney, acquired: Secondary | ICD-10-CM

## 2021-03-16 ENCOUNTER — Ambulatory Visit
Admission: RE | Admit: 2021-03-16 | Discharge: 2021-03-16 | Disposition: A | Discharge status: Home / Self Care | Payer: Medicare Other | Source: Ambulatory Visit | Attending: Internal Medicine | Admitting: Internal Medicine

## 2021-03-16 ENCOUNTER — Other Ambulatory Visit: Payer: Self-pay

## 2021-03-16 DIAGNOSIS — N281 Cyst of kidney, acquired: Secondary | ICD-10-CM | POA: Insufficient documentation

## 2021-03-16 IMAGING — US US RENAL
1 series · 14 of 25 positions shown · non-contrast
Comparison: [DATE] [DATE], [DATE].  [DATE] [DATE], [DATE].  [DATE] [DATE], [DATE].

CLINICAL DATA: Right renal cyst.

EXAM:
RENAL / URINARY TRACT ULTRASOUND COMPLETE

[Series 1: us renal · 0.25mm/px · 14 of 78 slices shown]
[im 1/78]
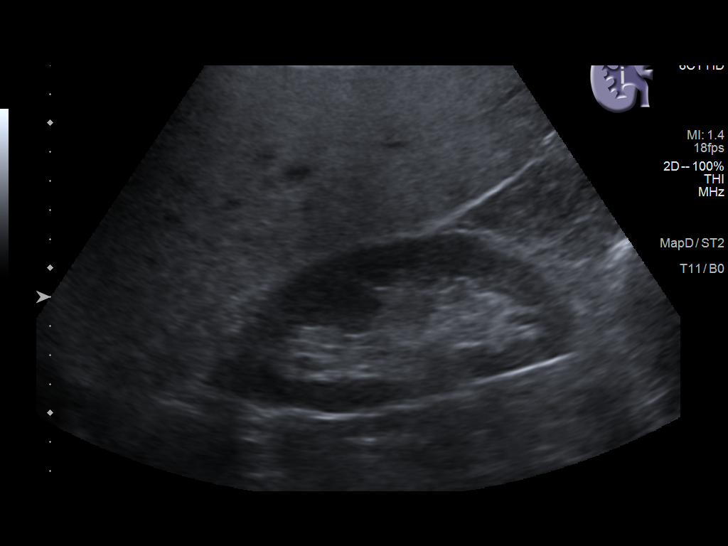
[im 7/78]
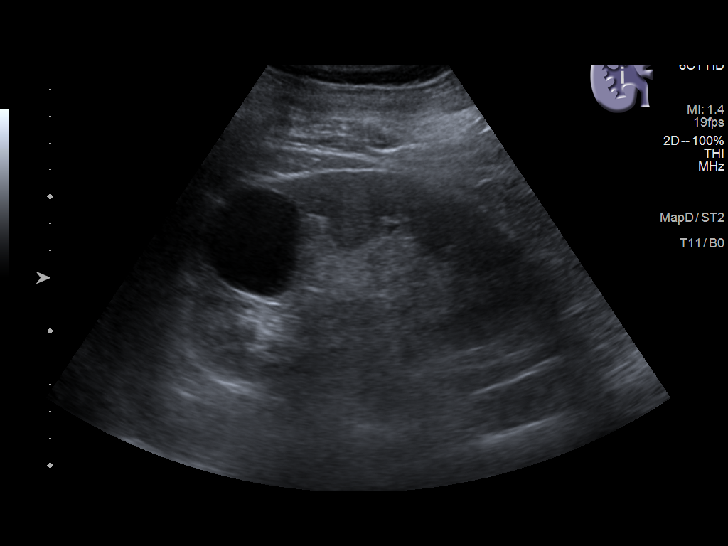
[im 13/78]
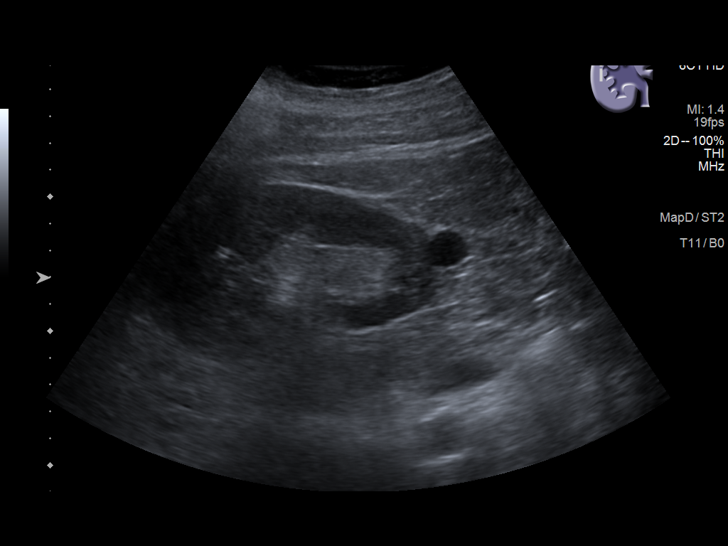
[im 20/78]
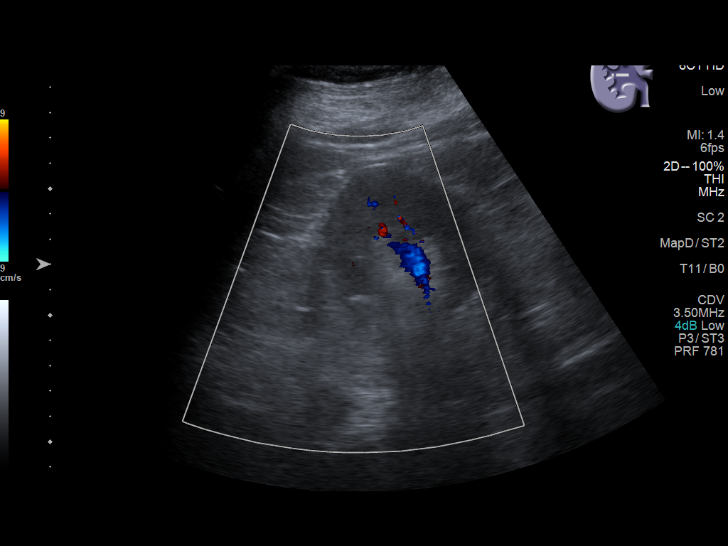
[im 26/78]
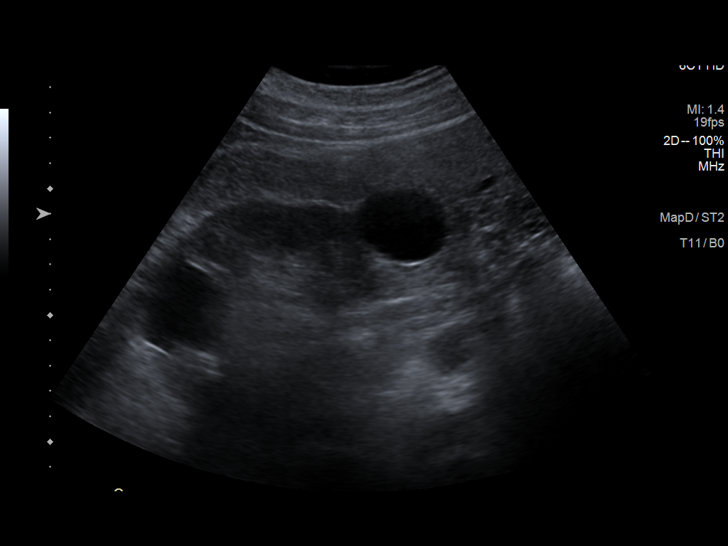
[im 29/78]
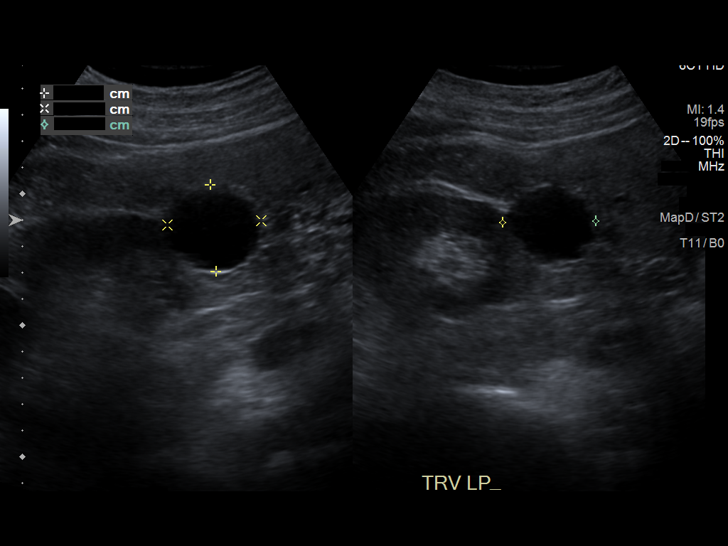
[im 36/78]
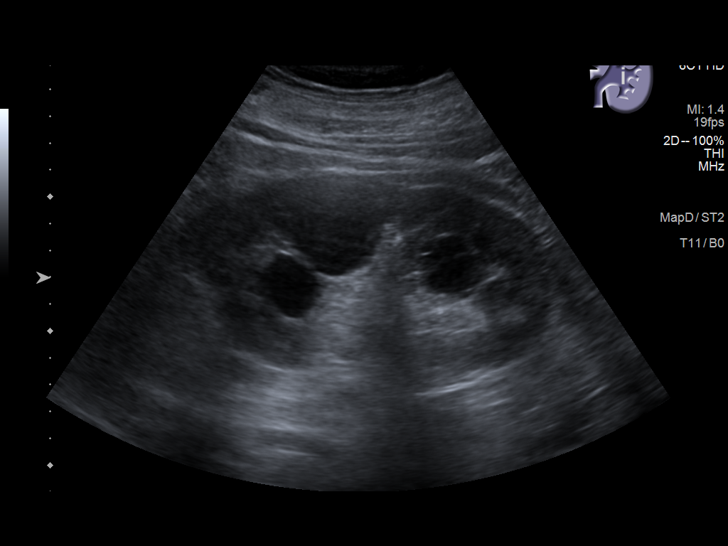
[im 42/78]
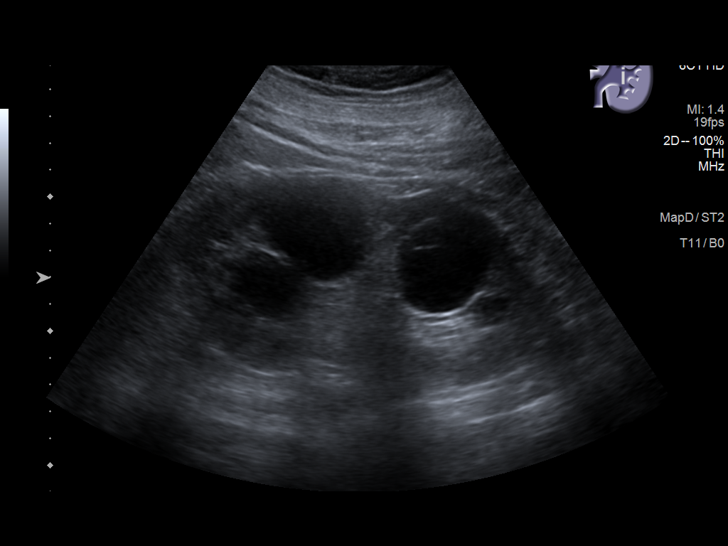
[im 49/78]
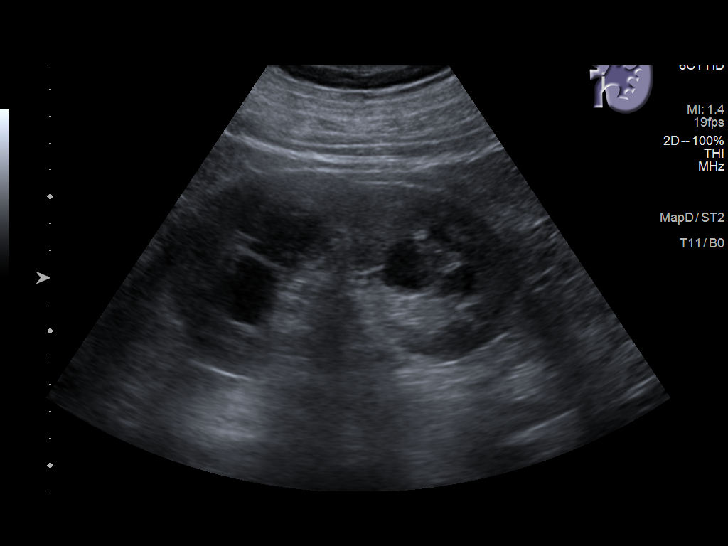
[im 52/78]
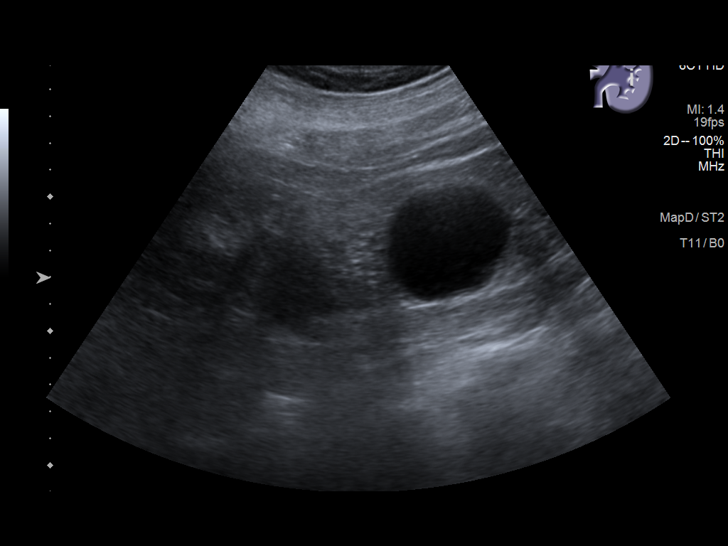
[im 58/78]
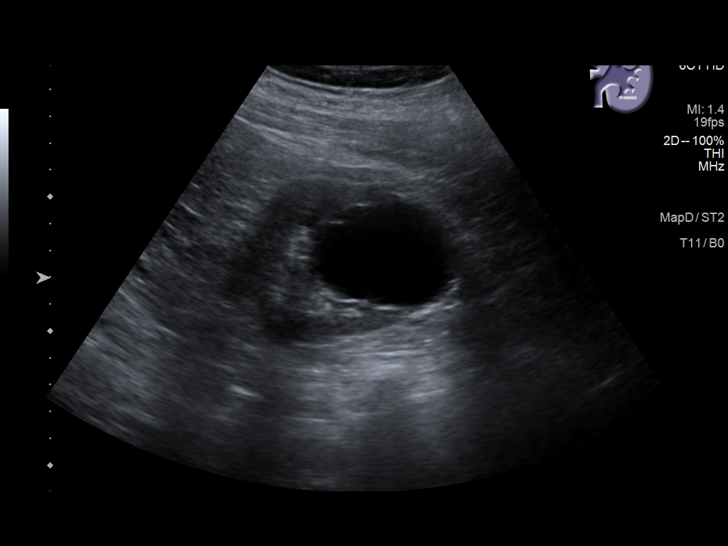
[im 65/78]
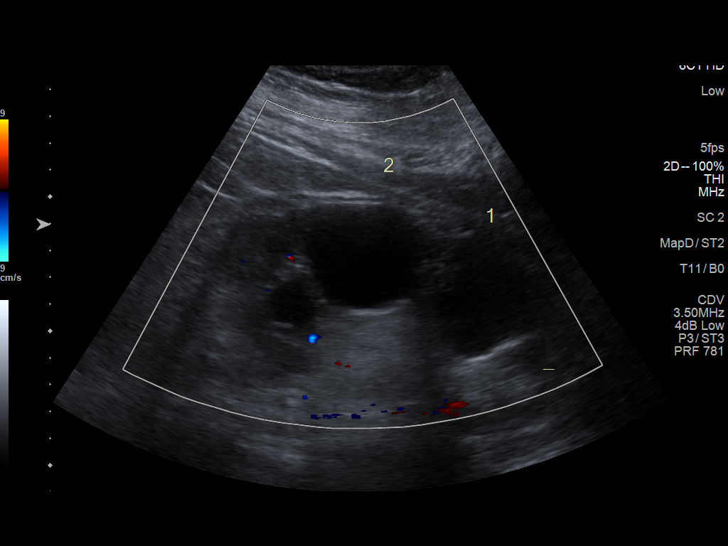
[im 71/78]
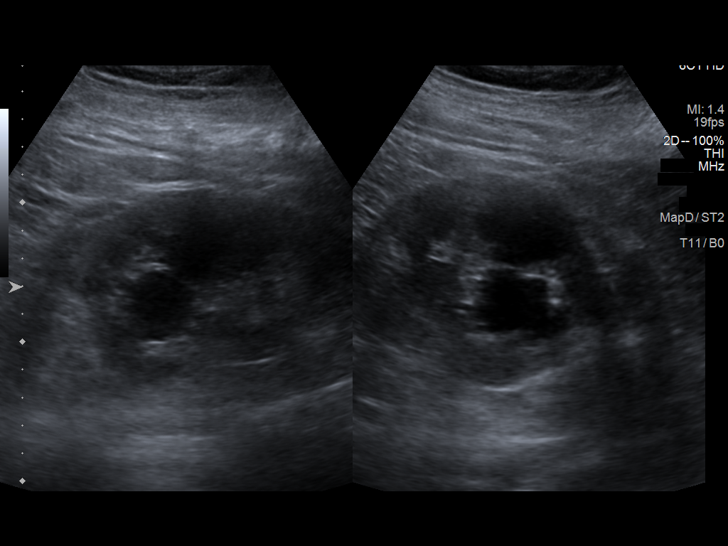
[im 78/78]
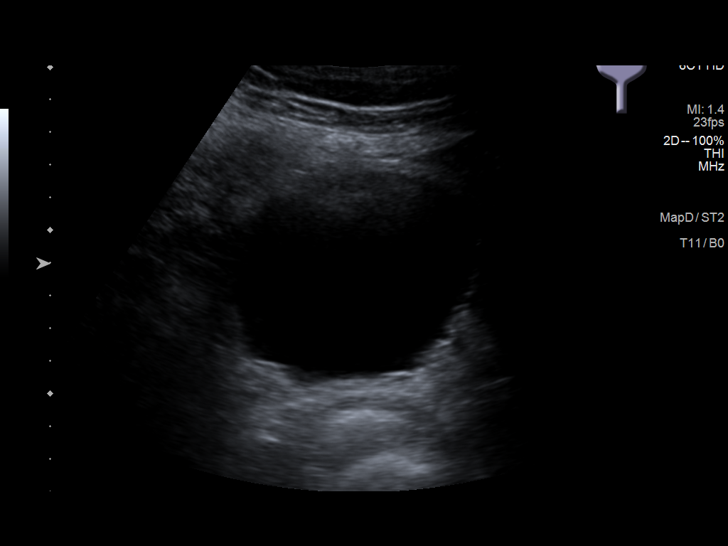

[14 of 25 positions shown; findings below may reference images not displayed]

FINDINGS: Right Kidney:

Renal measurements: 12.3 x 7.3 x 4.9 cm = volume: 228 mL. Two simple
cysts are noted, the largest measuring 4.1 cm in upper pole. 3.6 cm
simple cyst is noted in lower pole. Echogenicity within normal
limits. No mass or hydronephrosis visualized.

Left Kidney:

Renal measurements: 14.8 x 6.6 x 6.0 cm = volume: 307 mL. At least 3
simple cysts are noted, the largest measuring 5.2 cm in lower pole.
4.8 cm simple cyst is noted in midpole. Echogenicity within normal
limits. No mass or hydronephrosis visualized.

Bladder:

Appears normal for degree of bladder distention.

Other:

None.
IMPRESSION: Bilateral simple renal cysts are noted. No other renal abnormality
is noted.

## 2021-10-02 ENCOUNTER — Other Ambulatory Visit: Payer: Self-pay | Admitting: Neurosurgery

## 2021-10-02 DIAGNOSIS — Z01818 Encounter for other preprocedural examination: Secondary | ICD-10-CM

## 2021-10-15 ENCOUNTER — Encounter
Admission: RE | Admit: 2021-10-15 | Discharge: 2021-10-15 | Disposition: A | Discharge status: Home / Self Care | Payer: Medicare Other | Source: Ambulatory Visit | Attending: Neurosurgery | Admitting: Neurosurgery

## 2021-10-15 VITALS — BP 137/63 | HR 63 | Resp 16 | Ht 67.0 in | Wt 235.7 lb

## 2021-10-15 DIAGNOSIS — E119 Type 2 diabetes mellitus without complications: Secondary | ICD-10-CM | POA: Diagnosis not present

## 2021-10-15 DIAGNOSIS — Z01818 Encounter for other preprocedural examination: Secondary | ICD-10-CM

## 2021-10-15 DIAGNOSIS — Z01812 Encounter for preprocedural laboratory examination: Secondary | ICD-10-CM | POA: Diagnosis present

## 2021-10-15 DIAGNOSIS — I10 Essential (primary) hypertension: Secondary | ICD-10-CM | POA: Insufficient documentation

## 2021-10-15 DIAGNOSIS — J449 Chronic obstructive pulmonary disease, unspecified: Secondary | ICD-10-CM | POA: Insufficient documentation

## 2021-10-15 HISTORY — DX: Unspecified hemorrhoids: K64.9

## 2021-10-15 HISTORY — DX: Unspecified osteoarthritis, unspecified site: M19.90

## 2021-10-15 HISTORY — DX: Dyspnea, unspecified: R06.00

## 2021-10-15 LAB — URINALYSIS, ROUTINE W REFLEX MICROSCOPIC
Bacteria, UA: NONE SEEN
Bilirubin Urine: NEGATIVE
Glucose, UA: >=500 mg/dL — AB
Hgb urine dipstick: NEGATIVE
Ketones, ur: NEGATIVE mg/dL
Leukocytes,Ua: NEGATIVE
Nitrite: NEGATIVE
Protein, ur: NEGATIVE mg/dL
Specific Gravity, Urine: 1.021 (ref 1.005–1.030)
Squamous Epithelial / HPF: NONE SEEN (ref 0–5)
pH: 5 (ref 5.0–8.0)

## 2021-10-15 LAB — TYPE AND SCREEN
ABO/RH(D): A NEG
Antibody Screen: NEGATIVE

## 2021-10-15 LAB — SURGICAL PCR SCREEN
MRSA, PCR: NEGATIVE
Staphylococcus aureus: NEGATIVE

## 2021-10-15 NOTE — Patient Instructions (Addendum)
Your procedure is scheduled on:10-26-21 Monday Report to the Registration Desk on the 1st floor of the [facility].Then proceed to the 2nd floor Surgery Desk To find out your arrival time, please call [phone] between 1PM - 3PM on:10-23-21 Friday If your arrival time is 6:00 am, do not arrive prior to that time as the [facility] entrance doors do not open until 6:00 am.  REMEMBER: Instructions that are not followed completely may result in serious medical risk, up to and including death; or upon the discretion of your surgeon and anesthesiologist your surgery may need to be rescheduled.  Do not eat food after midnight the night before surgery.  No gum chewing, lozengers or hard candies.  You may however, drink Water up to 2 hours before you are scheduled to arrive for your surgery. Do not drink anything within 2 hours of your scheduled arrival time.  Type 1 and Type 2 diabetics should only drink water.  TAKE THESE MEDICATIONS THE MORNING OF SURGERY WITH A SIP OF WATER: -amLODipine (NORVASC) -cloNIDine (CATAPRES) -metoprolol succinate (TOPROL-XL)  -Roflumilast (DALIRESP)  Stop your aspirin EC 81 MG tablet 7 days prior to surgery-Last dose will be on 10-18-21 Sunday  Stop your Metformin (Glucophage) 2 days prior to surgery-Last dose on 10-23-21 Friday  Stop your canagliflozin (Invokana) 3 days prior to surgery-Last dose on 10-22-21 Thursday   Use your Ipratropium-Albuterol Nebulizer the day of surgery  One week prior to surgery: Stop Anti-inflammatories (NSAIDS) such as meloxicam (MOBIC), Advil, Aleve, Ibuprofen, Motrin, Naproxen, Naprosyn and Aspirin based products such as Excedrin, Goodys Powder, BC Powder.You may however, take Tylenol if needed for pain up until the day of surgery.  Stop ANY OVER THE COUNTER supplements/vitamins 7 days prior to surgery  No Alcohol for 24 hours before or after surgery.  No Smoking including e-cigarettes for 24 hours prior to surgery.  No chewable  tobacco products for at least 6 hours prior to surgery.  No nicotine patches on the day of surgery.  Do not use any "recreational" drugs for at least a week prior to your surgery.  Please be advised that the combination of cocaine and anesthesia may have negative outcomes, up to and including death. If you test positive for cocaine, your surgery will be cancelled.  On the morning of surgery brush your teeth with toothpaste and water, you may rinse your mouth with mouthwash if you wish. Do not swallow any toothpaste or mouthwash.  Use CHG Soap as directed on instruction sheet.  Do not wear jewelry, make-up, hairpins, clips or nail polish.  Do not wear lotions, powders, or perfumes.   Do not shave body from the neck down 48 hours prior to surgery just in case you cut yourself which could leave a site for infection.  Also, freshly shaved skin may become irritated if using the CHG soap.  Contact lenses, hearing aids and dentures may not be worn into surgery.  Do not bring valuables to the hospital. [facility] is not responsible for any missing/lost belongings or valuables.   Bring your C-PAP to the hospital  Notify your doctor if there is any change in your medical condition (cold, fever, infection).  Wear comfortable clothing (specific to your surgery type) to the hospital.  After surgery, you can help prevent lung complications by doing breathing exercises.  Take deep breaths and cough every 1-2 hours. Your doctor may order a device called an Incentive Spirometer to help you take deep breaths. When coughing or sneezing,  hold a pillow firmly against your incision with both hands. This is called "splinting." Doing this helps protect your incision. It also decreases belly discomfort.  If you are being admitted to the hospital overnight, leave your suitcase in the car. After surgery it may be brought to your room.  If you are being discharged the day of surgery, you will not be allowed  to drive home. You will need a responsible adult (18 years or older) to drive you home and stay with you that night.   If you are taking public transportation, you will need to have a responsible adult (18 years or older) with you. Please confirm with your physician that it is acceptable to use public transportation.   Please call the [facility] Dept. at [phone] if you have any questions about these instructions.  Surgery Visitation Policy:  Patients undergoing a surgery or procedure may have two family members or support persons with them as long as the person is not COVID-19 positive or experiencing its symptoms.   Inpatient Visitation:    Visiting hours are 7 a.m. to 8 p.m. Up to four visitors are allowed at one time in a patient room, including children. The visitors may rotate out with other people during the day. One designated support person (adult) may remain overnight.

## 2021-10-25 MED ORDER — FAMOTIDINE 20 MG PO TABS: 20.0000 mg | ORAL_TABLET | Freq: Once | ORAL | Status: AC

## 2021-10-25 MED ORDER — SODIUM CHLORIDE 0.9 % IV SOLN: INTRAVENOUS | Status: DC

## 2021-10-25 MED ORDER — CEFAZOLIN SODIUM-DEXTROSE 2-4 GM/100ML-% IV SOLN
2.0000 g | INTRAVENOUS | Status: AC
  Administered 2021-10-26: 2 g via INTRAVENOUS

## 2021-10-25 MED ORDER — VANCOMYCIN HCL 1750 MG/350ML IV SOLN
1750.0000 mg | INTRAVENOUS | Status: AC
  Administered 2021-10-26: 1750 mg via INTRAVENOUS
  Filled 2021-10-25: qty 350

## 2021-10-25 MED ORDER — CHLORHEXIDINE GLUCONATE 0.12 % MT SOLN: 15.0000 mL | Freq: Once | OROMUCOSAL | Status: AC

## 2021-10-25 MED ORDER — ORAL CARE MOUTH RINSE: 15.0000 mL | Freq: Once | OROMUCOSAL | Status: AC

## 2021-10-25 NOTE — Progress Notes (Signed)
Pharmacy Antibiotic Note  Trust Dean Simpson is a 71 y.o. male admitted on (Not on file) with surgical prophylaxis.  Pharmacy has been consulted for Vancomycin , Cefazolin dosing.  Plan: TBW = 106.9 kg   Cefazolin 2 gm IV X 1 60 min pre-op ordered for 7/10 @ 0500.  Vancomycin 1750 mg (15 mg/kg) IV X 1 60 min pre-op ordered for 7/10 @ 0500.      No data recorded.  No results for input(s): "WBC", "CREATININE", "LATICACIDVEN", "VANCOTROUGH", "VANCOPEAK", "VANCORANDOM", "GENTTROUGH", "GENTPEAK", "GENTRANDOM", "TOBRATROUGH", "TOBRAPEAK", "TOBRARND", "AMIKACINPEAK", "AMIKACINTROU", "AMIKACIN" in the last 168 hours.  CrCl cannot be calculated (No successful lab value found.).    Allergies  Allergen Reactions   Iodine     shellfish   Shellfish Allergy Itching and Swelling    Antimicrobials this admission:   >>    >>   Dose adjustments this admission:   Microbiology results:  BCx:   UCx:    Sputum:    MRSA PCR:   Thank you for allowing pharmacy to be a part of this patient's care.  Dean Simpson 10/25/2021 11:28 PM

## 2021-10-26 ENCOUNTER — Inpatient Hospital Stay: Payer: Medicare Other

## 2021-10-26 ENCOUNTER — Other Ambulatory Visit: Payer: Self-pay

## 2021-10-26 ENCOUNTER — Encounter: Payer: Self-pay | Admitting: Neurosurgery

## 2021-10-26 ENCOUNTER — Inpatient Hospital Stay
Admission: RE | Admit: 2021-10-26 | Discharge: 2021-10-30 | DRG: 455 | Disposition: A | Discharge status: Home Health | Payer: Medicare Other | Attending: Neurosurgery | Admitting: Neurosurgery

## 2021-10-26 ENCOUNTER — Inpatient Hospital Stay: Payer: Medicare Other | Admitting: Urgent Care

## 2021-10-26 ENCOUNTER — Encounter
Admission: RE | Disposition: A | Discharge status: Home Health | Payer: Self-pay | Source: Home / Self Care | Attending: Neurosurgery

## 2021-10-26 DIAGNOSIS — J438 Other emphysema: Secondary | ICD-10-CM | POA: Diagnosis present

## 2021-10-26 DIAGNOSIS — I10 Essential (primary) hypertension: Secondary | ICD-10-CM | POA: Diagnosis present

## 2021-10-26 DIAGNOSIS — Z8249 Family history of ischemic heart disease and other diseases of the circulatory system: Secondary | ICD-10-CM

## 2021-10-26 DIAGNOSIS — M4316 Spondylolisthesis, lumbar region: Principal | ICD-10-CM | POA: Diagnosis present

## 2021-10-26 DIAGNOSIS — E785 Hyperlipidemia, unspecified: Secondary | ICD-10-CM | POA: Diagnosis present

## 2021-10-26 DIAGNOSIS — Z7984 Long term (current) use of oral hypoglycemic drugs: Secondary | ICD-10-CM

## 2021-10-26 DIAGNOSIS — Z91013 Allergy to seafood: Secondary | ICD-10-CM

## 2021-10-26 DIAGNOSIS — G4730 Sleep apnea, unspecified: Secondary | ICD-10-CM | POA: Diagnosis present

## 2021-10-26 DIAGNOSIS — M5416 Radiculopathy, lumbar region: Secondary | ICD-10-CM | POA: Diagnosis present

## 2021-10-26 DIAGNOSIS — N400 Enlarged prostate without lower urinary tract symptoms: Secondary | ICD-10-CM | POA: Diagnosis present

## 2021-10-26 DIAGNOSIS — Z7951 Long term (current) use of inhaled steroids: Secondary | ICD-10-CM

## 2021-10-26 DIAGNOSIS — Z87891 Personal history of nicotine dependence: Secondary | ICD-10-CM

## 2021-10-26 DIAGNOSIS — Z79899 Other long term (current) drug therapy: Secondary | ICD-10-CM

## 2021-10-26 DIAGNOSIS — Z91041 Radiographic dye allergy status: Secondary | ICD-10-CM

## 2021-10-26 DIAGNOSIS — E119 Type 2 diabetes mellitus without complications: Secondary | ICD-10-CM | POA: Diagnosis present

## 2021-10-26 DIAGNOSIS — Z7982 Long term (current) use of aspirin: Secondary | ICD-10-CM | POA: Diagnosis not present

## 2021-10-26 DIAGNOSIS — Z86010 Personal history of colonic polyps: Secondary | ICD-10-CM | POA: Diagnosis not present

## 2021-10-26 DIAGNOSIS — Z981 Arthrodesis status: Principal | ICD-10-CM

## 2021-10-26 DIAGNOSIS — L409 Psoriasis, unspecified: Secondary | ICD-10-CM | POA: Diagnosis present

## 2021-10-26 DIAGNOSIS — Z7952 Long term (current) use of systemic steroids: Secondary | ICD-10-CM | POA: Diagnosis not present

## 2021-10-26 DIAGNOSIS — M545 Low back pain, unspecified: Secondary | ICD-10-CM | POA: Diagnosis present

## 2021-10-26 DIAGNOSIS — Z01818 Encounter for other preprocedural examination: Secondary | ICD-10-CM

## 2021-10-26 HISTORY — PX: TRANSFORAMINAL LUMBAR INTERBODY FUSION (TLIF) WITH PEDICLE SCREW FIXATION 2 LEVEL: SHX6142

## 2021-10-26 HISTORY — PX: APPLICATION OF INTRAOPERATIVE CT SCAN: SHX6668

## 2021-10-26 LAB — GLUCOSE, CAPILLARY
Glucose-Capillary: 151 mg/dL — ABNORMAL HIGH (ref 70–99)
Glucose-Capillary: 235 mg/dL — ABNORMAL HIGH (ref 70–99)
Glucose-Capillary: 190 mg/dL — ABNORMAL HIGH (ref 70–99)

## 2021-10-26 LAB — ABO/RH: ABO/RH(D): A NEG

## 2021-10-26 SURGERY — TRANSFORAMINAL LUMBAR INTERBODY FUSION (TLIF) WITH PEDICLE SCREW FIXATION 2 LEVEL
Anesthesia: General | Site: Spine Lumbar

## 2021-10-26 MED ORDER — MIDAZOLAM HCL 2 MG/2ML IJ SOLN
INTRAMUSCULAR | Status: DC | PRN
  Administered 2021-10-26: 2 mg via INTRAVENOUS

## 2021-10-26 MED ORDER — IRBESARTAN 150 MG PO TABS
300.0000 mg | ORAL_TABLET | Freq: Every day | ORAL | Status: DC
  Administered 2021-10-28 – 2021-10-30 (×3): 300 mg via ORAL
  Filled 2021-10-26 (×4): qty 2

## 2021-10-26 MED ORDER — INSULIN ASPART 100 UNIT/ML IJ SOLN
5.0000 [IU] | Freq: Once | INTRAMUSCULAR | Status: AC
Start: 2021-10-26 — End: 2021-10-26
  Administered 2021-10-26: 5 [IU] via SUBCUTANEOUS

## 2021-10-26 MED ORDER — AMLODIPINE BESYLATE 10 MG PO TABS
10.0000 mg | ORAL_TABLET | ORAL | Status: DC
  Administered 2021-10-28 – 2021-10-30 (×3): 10 mg via ORAL
  Filled 2021-10-26 (×4): qty 1

## 2021-10-26 MED ORDER — PHENYLEPHRINE HCL-NACL 20-0.9 MG/250ML-% IV SOLN
INTRAVENOUS | Status: AC
  Filled 2021-10-26: qty 250

## 2021-10-26 MED ORDER — OXYCODONE HCL 5 MG PO TABS: 5.0000 mg | ORAL_TABLET | Freq: Once | ORAL | Status: DC | PRN

## 2021-10-26 MED ORDER — BUPIVACAINE LIPOSOME 1.3 % IJ SUSP
INTRAMUSCULAR | Status: AC
  Filled 2021-10-26: qty 20

## 2021-10-26 MED ORDER — GLYCOPYRROLATE 0.2 MG/ML IJ SOLN
INTRAMUSCULAR | Status: DC | PRN
  Administered 2021-10-26: .2 mg via INTRAVENOUS

## 2021-10-26 MED ORDER — BUPIVACAINE-EPINEPHRINE (PF) 0.5% -1:200000 IJ SOLN
INTRAMUSCULAR | Status: DC | PRN
  Administered 2021-10-26: 7 mL

## 2021-10-26 MED ORDER — SODIUM CHLORIDE FLUSH 0.9 % IV SOLN
INTRAVENOUS | Status: AC
  Filled 2021-10-26: qty 20

## 2021-10-26 MED ORDER — SODIUM CHLORIDE 0.9 % IV SOLN: INTRAVENOUS | Status: DC

## 2021-10-26 MED ORDER — BUPIVACAINE-EPINEPHRINE (PF) 0.5% -1:200000 IJ SOLN
INTRAMUSCULAR | Status: AC
  Filled 2021-10-26: qty 30

## 2021-10-26 MED ORDER — INSULIN ASPART 100 UNIT/ML IJ SOLN
0.0000 [IU] | Freq: Every day | INTRAMUSCULAR | Status: DC
  Administered 2021-10-27: 2 [IU] via SUBCUTANEOUS
  Filled 2021-10-26: qty 1

## 2021-10-26 MED ORDER — BISACODYL 10 MG RE SUPP: 10.0000 mg | Freq: Every day | RECTAL | Status: DC | PRN

## 2021-10-26 MED ORDER — PHENOL 1.4 % MT LIQD: 1.0000 | OROMUCOSAL | Status: DC | PRN

## 2021-10-26 MED ORDER — MIDAZOLAM HCL 2 MG/2ML IJ SOLN
INTRAMUSCULAR | Status: AC
Start: 2021-10-26 — End: ?
  Filled 2021-10-26: qty 2

## 2021-10-26 MED ORDER — SODIUM CHLORIDE 0.9% FLUSH: 3.0000 mL | INTRAVENOUS | Status: DC | PRN

## 2021-10-26 MED ORDER — IPRATROPIUM BROMIDE 0.02 % IN SOLN
0.5000 mg | Freq: Two times a day (BID) | RESPIRATORY_TRACT | Status: DC
  Administered 2021-10-26: 0.5 mg via RESPIRATORY_TRACT
  Filled 2021-10-26: qty 2.5

## 2021-10-26 MED ORDER — SUCCINYLCHOLINE CHLORIDE 200 MG/10ML IV SOSY
PREFILLED_SYRINGE | INTRAVENOUS | Status: DC | PRN
  Administered 2021-10-26: 120 mg via INTRAVENOUS

## 2021-10-26 MED ORDER — METOPROLOL SUCCINATE ER 50 MG PO TB24
100.0000 mg | ORAL_TABLET | ORAL | Status: DC
  Administered 2021-10-28 – 2021-10-30 (×3): 100 mg via ORAL
  Filled 2021-10-26 (×4): qty 2

## 2021-10-26 MED ORDER — ROFLUMILAST 500 MCG PO TABS
250.0000 ug | ORAL_TABLET | Freq: Every day | ORAL | Status: DC
  Administered 2021-10-27 – 2021-10-29 (×3): 250 ug via ORAL
  Filled 2021-10-26 (×5): qty 1

## 2021-10-26 MED ORDER — DEXMEDETOMIDINE HCL IN NACL 80 MCG/20ML IV SOLN
INTRAVENOUS | Status: AC
  Filled 2021-10-26: qty 20

## 2021-10-26 MED ORDER — 0.9 % SODIUM CHLORIDE (POUR BTL) OPTIME
TOPICAL | Status: DC | PRN
  Administered 2021-10-26: 1000 mL

## 2021-10-26 MED ORDER — SURGIPHOR WOUND IRRIGATION SYSTEM - OPTIME
TOPICAL | Status: DC | PRN
  Administered 2021-10-26: 450 mL via TOPICAL

## 2021-10-26 MED ORDER — LIDOCAINE HCL (CARDIAC) PF 100 MG/5ML IV SOSY
PREFILLED_SYRINGE | INTRAVENOUS | Status: DC | PRN
  Administered 2021-10-26: 100 mg via INTRAVENOUS

## 2021-10-26 MED ORDER — VANCOMYCIN HCL 1000 MG IV SOLR
INTRAVENOUS | Status: DC | PRN
  Administered 2021-10-26: 1000 mg

## 2021-10-26 MED ORDER — FENTANYL CITRATE (PF) 100 MCG/2ML IJ SOLN
INTRAMUSCULAR | Status: AC
  Filled 2021-10-26: qty 2

## 2021-10-26 MED ORDER — ALBUMIN HUMAN 5 % IV SOLN: INTRAVENOUS | Status: DC | PRN

## 2021-10-26 MED ORDER — DOCUSATE SODIUM 100 MG PO CAPS
100.0000 mg | ORAL_CAPSULE | Freq: Two times a day (BID) | ORAL | Status: DC
  Administered 2021-10-26 – 2021-10-30 (×8): 100 mg via ORAL
  Filled 2021-10-26 (×8): qty 1

## 2021-10-26 MED ORDER — ONDANSETRON HCL 4 MG/2ML IJ SOLN
INTRAMUSCULAR | Status: AC
  Filled 2021-10-26: qty 2

## 2021-10-26 MED ORDER — CLONIDINE HCL 0.1 MG PO TABS
0.1000 mg | ORAL_TABLET | Freq: Two times a day (BID) | ORAL | Status: DC
  Administered 2021-10-26 – 2021-10-30 (×6): 0.1 mg via ORAL
  Filled 2021-10-26 (×8): qty 1

## 2021-10-26 MED ORDER — GABAPENTIN 300 MG PO CAPS
300.0000 mg | ORAL_CAPSULE | Freq: Every day | ORAL | Status: DC
  Administered 2021-10-26 – 2021-10-29 (×4): 300 mg via ORAL
  Filled 2021-10-26 (×4): qty 1

## 2021-10-26 MED ORDER — INSULIN ASPART 100 UNIT/ML IJ SOLN
INTRAMUSCULAR | Status: AC
  Filled 2021-10-26: qty 1

## 2021-10-26 MED ORDER — SODIUM CHLORIDE 0.9% FLUSH
3.0000 mL | Freq: Two times a day (BID) | INTRAVENOUS | Status: DC
  Administered 2021-10-26 – 2021-10-30 (×8): 3 mL via INTRAVENOUS

## 2021-10-26 MED ORDER — HYDROCHLOROTHIAZIDE 25 MG PO TABS
25.0000 mg | ORAL_TABLET | Freq: Every day | ORAL | Status: DC
  Administered 2021-10-28 – 2021-10-30 (×3): 25 mg via ORAL
  Filled 2021-10-26 (×4): qty 1

## 2021-10-26 MED ORDER — FENTANYL CITRATE (PF) 100 MCG/2ML IJ SOLN
INTRAMUSCULAR | Status: DC | PRN
  Administered 2021-10-26 (×4): 50 ug via INTRAVENOUS

## 2021-10-26 MED ORDER — VALSARTAN-HYDROCHLOROTHIAZIDE 320-25 MG PO TABS: 1.0000 | ORAL_TABLET | ORAL | Status: DC

## 2021-10-26 MED ORDER — OXYCODONE HCL 5 MG/5ML PO SOLN: 5.0000 mg | Freq: Once | ORAL | Status: DC | PRN

## 2021-10-26 MED ORDER — GLYCOPYRROLATE 0.2 MG/ML IJ SOLN
INTRAMUSCULAR | Status: AC
  Filled 2021-10-26: qty 1

## 2021-10-26 MED ORDER — DEXMEDETOMIDINE HCL IN NACL 200 MCG/50ML IV SOLN
INTRAVENOUS | Status: DC | PRN
  Administered 2021-10-26: 8 ug via INTRAVENOUS

## 2021-10-26 MED ORDER — METHOCARBAMOL 500 MG PO TABS
500.0000 mg | ORAL_TABLET | Freq: Four times a day (QID) | ORAL | Status: DC | PRN
  Administered 2021-10-26 – 2021-10-30 (×9): 500 mg via ORAL
  Filled 2021-10-26 (×9): qty 1

## 2021-10-26 MED ORDER — MORPHINE SULFATE (PF) 2 MG/ML IV SOLN
2.0000 mg | INTRAVENOUS | Status: DC | PRN
  Administered 2021-10-29: 2 mg via INTRAVENOUS
  Filled 2021-10-26: qty 1

## 2021-10-26 MED ORDER — OXYCODONE HCL 5 MG PO TABS
5.0000 mg | ORAL_TABLET | ORAL | Status: DC | PRN
  Administered 2021-10-27 – 2021-10-30 (×13): 5 mg via ORAL
  Filled 2021-10-26 (×13): qty 1

## 2021-10-26 MED ORDER — SUCCINYLCHOLINE CHLORIDE 200 MG/10ML IV SOSY
PREFILLED_SYRINGE | INTRAVENOUS | Status: AC
  Filled 2021-10-26: qty 10

## 2021-10-26 MED ORDER — KETOROLAC TROMETHAMINE 15 MG/ML IJ SOLN
15.0000 mg | Freq: Four times a day (QID) | INTRAMUSCULAR | Status: AC
  Administered 2021-10-26 – 2021-10-27 (×4): 15 mg via INTRAVENOUS
  Filled 2021-10-26 (×4): qty 1

## 2021-10-26 MED ORDER — ALBUTEROL SULFATE (2.5 MG/3ML) 0.083% IN NEBU
2.5000 mg | INHALATION_SOLUTION | RESPIRATORY_TRACT | Status: DC
  Administered 2021-10-26: 2.5 mg via RESPIRATORY_TRACT
  Filled 2021-10-26: qty 3

## 2021-10-26 MED ORDER — EPHEDRINE 5 MG/ML INJ
INTRAVENOUS | Status: AC
  Filled 2021-10-26: qty 5

## 2021-10-26 MED ORDER — ONDANSETRON HCL 4 MG/2ML IJ SOLN: 4.0000 mg | Freq: Once | INTRAMUSCULAR | Status: DC | PRN

## 2021-10-26 MED ORDER — PHENYLEPHRINE 80 MCG/ML (10ML) SYRINGE FOR IV PUSH (FOR BLOOD PRESSURE SUPPORT)
PREFILLED_SYRINGE | INTRAVENOUS | Status: AC
  Filled 2021-10-26: qty 10

## 2021-10-26 MED ORDER — BUPIVACAINE HCL (PF) 0.5 % IJ SOLN
INTRAMUSCULAR | Status: AC
  Filled 2021-10-26: qty 30

## 2021-10-26 MED ORDER — HYDROMORPHONE HCL 1 MG/ML IJ SOLN
INTRAMUSCULAR | Status: DC | PRN
  Administered 2021-10-26 (×2): .5 mg via INTRAVENOUS

## 2021-10-26 MED ORDER — FAMOTIDINE 20 MG PO TABS
ORAL_TABLET | ORAL | Status: AC
  Administered 2021-10-26: 20 mg via ORAL
  Filled 2021-10-26: qty 1

## 2021-10-26 MED ORDER — ONDANSETRON HCL 4 MG/2ML IJ SOLN
INTRAMUSCULAR | Status: DC | PRN
  Administered 2021-10-26: 4 mg via INTRAVENOUS

## 2021-10-26 MED ORDER — MENTHOL 3 MG MT LOZG: 1.0000 | LOZENGE | OROMUCOSAL | Status: DC | PRN

## 2021-10-26 MED ORDER — PROPOFOL 10 MG/ML IV BOLUS
INTRAVENOUS | Status: DC | PRN
  Administered 2021-10-26: 50 mg via INTRAVENOUS
  Administered 2021-10-26: 150 mg via INTRAVENOUS

## 2021-10-26 MED ORDER — ENOXAPARIN SODIUM 40 MG/0.4ML IJ SOSY
40.0000 mg | PREFILLED_SYRINGE | INTRAMUSCULAR | Status: DC
  Administered 2021-10-27 – 2021-10-30 (×4): 40 mg via SUBCUTANEOUS
  Filled 2021-10-26 (×4): qty 0.4

## 2021-10-26 MED ORDER — POLYETHYLENE GLYCOL 3350 17 G PO PACK
17.0000 g | PACK | Freq: Every day | ORAL | Status: DC | PRN
  Administered 2021-10-30: 17 g via ORAL
  Filled 2021-10-26: qty 1

## 2021-10-26 MED ORDER — HYDROXYZINE HCL 25 MG PO TABS
25.0000 mg | ORAL_TABLET | Freq: Three times a day (TID) | ORAL | Status: DC | PRN
  Filled 2021-10-26: qty 1

## 2021-10-26 MED ORDER — METFORMIN HCL 500 MG PO TABS
1000.0000 mg | ORAL_TABLET | Freq: Two times a day (BID) | ORAL | Status: DC
  Administered 2021-10-27 – 2021-10-30 (×7): 1000 mg via ORAL
  Filled 2021-10-26 (×7): qty 2

## 2021-10-26 MED ORDER — HYDROMORPHONE HCL 1 MG/ML IJ SOLN
INTRAMUSCULAR | Status: AC
  Filled 2021-10-26: qty 1

## 2021-10-26 MED ORDER — SODIUM CHLORIDE (PF) 0.9 % IJ SOLN
INTRAMUSCULAR | Status: DC | PRN
  Administered 2021-10-26: 60 mL

## 2021-10-26 MED ORDER — FENTANYL CITRATE (PF) 100 MCG/2ML IJ SOLN: 25.0000 ug | INTRAMUSCULAR | Status: DC | PRN

## 2021-10-26 MED ORDER — ACETAMINOPHEN 500 MG PO TABS
1000.0000 mg | ORAL_TABLET | Freq: Four times a day (QID) | ORAL | Status: DC
  Administered 2021-10-26 – 2021-10-30 (×13): 1000 mg via ORAL
  Filled 2021-10-26 (×14): qty 2

## 2021-10-26 MED ORDER — DEXAMETHASONE SODIUM PHOSPHATE 10 MG/ML IJ SOLN
INTRAMUSCULAR | Status: AC
  Filled 2021-10-26: qty 1

## 2021-10-26 MED ORDER — FLEET ENEMA 7-19 GM/118ML RE ENEM
1.0000 | ENEMA | Freq: Once | RECTAL | Status: DC | PRN
Start: 2021-10-26 — End: 2021-10-30

## 2021-10-26 MED ORDER — CEFAZOLIN SODIUM-DEXTROSE 2-4 GM/100ML-% IV SOLN
INTRAVENOUS | Status: AC
  Filled 2021-10-26: qty 100

## 2021-10-26 MED ORDER — LACTATED RINGERS IV SOLN: INTRAVENOUS | Status: DC

## 2021-10-26 MED ORDER — ROCURONIUM BROMIDE 10 MG/ML (PF) SYRINGE
PREFILLED_SYRINGE | INTRAVENOUS | Status: AC
  Filled 2021-10-26: qty 10

## 2021-10-26 MED ORDER — ONDANSETRON HCL 4 MG/2ML IJ SOLN
4.0000 mg | Freq: Four times a day (QID) | INTRAMUSCULAR | Status: DC | PRN
Start: 2021-10-26 — End: 2021-10-30
  Administered 2021-10-28 – 2021-10-29 (×2): 4 mg via INTRAVENOUS
  Filled 2021-10-26 (×2): qty 2

## 2021-10-26 MED ORDER — PHENYLEPHRINE HCL-NACL 20-0.9 MG/250ML-% IV SOLN
INTRAVENOUS | Status: DC | PRN
  Administered 2021-10-26: 20 ug/min via INTRAVENOUS

## 2021-10-26 MED ORDER — SURGIFLO WITH THROMBIN (HEMOSTATIC MATRIX KIT) OPTIME
TOPICAL | Status: DC | PRN
  Administered 2021-10-26 (×2): 1 via TOPICAL

## 2021-10-26 MED ORDER — ALBUMIN HUMAN 5 % IV SOLN
INTRAVENOUS | Status: AC
Start: 2021-10-26 — End: ?
  Filled 2021-10-26: qty 500

## 2021-10-26 MED ORDER — DOXAZOSIN MESYLATE 4 MG PO TABS
4.0000 mg | ORAL_TABLET | Freq: Every day | ORAL | Status: DC
  Administered 2021-10-27 – 2021-10-30 (×4): 4 mg via ORAL
  Filled 2021-10-26 (×4): qty 1

## 2021-10-26 MED ORDER — SEVOFLURANE IN SOLN
RESPIRATORY_TRACT | Status: AC
  Filled 2021-10-26: qty 250

## 2021-10-26 MED ORDER — OXYCODONE HCL 5 MG PO TABS
10.0000 mg | ORAL_TABLET | ORAL | Status: DC | PRN
  Administered 2021-10-29 (×2): 10 mg via ORAL
  Filled 2021-10-26 (×2): qty 2

## 2021-10-26 MED ORDER — ROCURONIUM BROMIDE 100 MG/10ML IV SOLN
INTRAVENOUS | Status: DC | PRN
  Administered 2021-10-26: 5 mg via INTRAVENOUS

## 2021-10-26 MED ORDER — SODIUM CHLORIDE 0.9 % IV SOLN
250.0000 mL | INTRAVENOUS | Status: DC
  Administered 2021-10-26: 250 mL via INTRAVENOUS

## 2021-10-26 MED ORDER — ONDANSETRON HCL 4 MG PO TABS
4.0000 mg | ORAL_TABLET | Freq: Four times a day (QID) | ORAL | Status: DC | PRN
  Administered 2021-10-30: 4 mg via ORAL
  Filled 2021-10-26: qty 1

## 2021-10-26 MED ORDER — CHLORHEXIDINE GLUCONATE 0.12 % MT SOLN
OROMUCOSAL | Status: AC
  Administered 2021-10-26: 15 mL via OROMUCOSAL
  Filled 2021-10-26: qty 15

## 2021-10-26 MED ORDER — IPRATROPIUM-ALBUTEROL 0.5-2.5 (3) MG/3ML IN SOLN
3.0000 mL | Freq: Two times a day (BID) | RESPIRATORY_TRACT | Status: DC
  Administered 2021-10-27 – 2021-10-30 (×6): 3 mL via RESPIRATORY_TRACT
  Filled 2021-10-26 (×7): qty 3

## 2021-10-26 MED ORDER — SENNA 8.6 MG PO TABS
1.0000 | ORAL_TABLET | Freq: Two times a day (BID) | ORAL | Status: DC
Start: 2021-10-26 — End: 2021-10-30
  Administered 2021-10-26 – 2021-10-30 (×8): 8.6 mg via ORAL
  Filled 2021-10-26 (×8): qty 1

## 2021-10-26 MED ORDER — ATORVASTATIN CALCIUM 20 MG PO TABS
40.0000 mg | ORAL_TABLET | Freq: Every day | ORAL | Status: DC
  Administered 2021-10-27 – 2021-10-30 (×4): 40 mg via ORAL
  Filled 2021-10-26 (×4): qty 2

## 2021-10-26 MED ORDER — DEXAMETHASONE SODIUM PHOSPHATE 10 MG/ML IJ SOLN
INTRAMUSCULAR | Status: DC | PRN
  Administered 2021-10-26: 10 mg via INTRAVENOUS

## 2021-10-26 MED ORDER — ACETAMINOPHEN 10 MG/ML IV SOLN: 1000.0000 mg | Freq: Once | INTRAVENOUS | Status: DC | PRN

## 2021-10-26 MED ORDER — INSULIN ASPART 100 UNIT/ML IJ SOLN
0.0000 [IU] | Freq: Three times a day (TID) | INTRAMUSCULAR | Status: DC
  Administered 2021-10-27 (×2): 5 [IU] via SUBCUTANEOUS
  Administered 2021-10-27: 3 [IU] via SUBCUTANEOUS
  Administered 2021-10-28 (×2): 5 [IU] via SUBCUTANEOUS
  Administered 2021-10-28 – 2021-10-29 (×4): 3 [IU] via SUBCUTANEOUS
  Administered 2021-10-30: 8 [IU] via SUBCUTANEOUS
  Filled 2021-10-26 (×10): qty 1

## 2021-10-26 MED ORDER — VANCOMYCIN HCL 1000 MG IV SOLR
INTRAVENOUS | Status: AC
  Filled 2021-10-26: qty 20

## 2021-10-26 MED ORDER — METHOCARBAMOL 1000 MG/10ML IJ SOLN
500.0000 mg | Freq: Four times a day (QID) | INTRAVENOUS | Status: DC | PRN
  Filled 2021-10-26: qty 5

## 2021-10-26 MED ORDER — PHENYLEPHRINE HCL (PRESSORS) 10 MG/ML IV SOLN
INTRAVENOUS | Status: DC | PRN
  Administered 2021-10-26 (×2): 80 ug via INTRAVENOUS
  Administered 2021-10-26: 160 ug via INTRAVENOUS
  Administered 2021-10-26: 80 ug via INTRAVENOUS

## 2021-10-26 MED ORDER — EPHEDRINE SULFATE (PRESSORS) 50 MG/ML IJ SOLN
INTRAMUSCULAR | Status: DC | PRN
  Administered 2021-10-26: 5 mg via INTRAVENOUS
  Administered 2021-10-26: 10 mg via INTRAVENOUS
  Administered 2021-10-26: 5 mg via INTRAVENOUS

## 2021-10-26 MED ORDER — LIDOCAINE HCL (PF) 2 % IJ SOLN
INTRAMUSCULAR | Status: AC
  Filled 2021-10-26: qty 5

## 2021-10-26 MED ORDER — GLIMEPIRIDE 4 MG PO TABS
4.0000 mg | ORAL_TABLET | Freq: Every day | ORAL | Status: DC
  Administered 2021-10-27 – 2021-10-30 (×4): 4 mg via ORAL
  Filled 2021-10-26 (×4): qty 1

## 2021-10-26 SURGICAL SUPPLY — 77 items
ALLOGRAFT BONE FIBER KORE 10CC (Bone Implant) ×1 IMPLANT
ALLOGRAFT BONE FIBER KORE 5 (Bone Implant) ×1 IMPLANT
BASIN KIT SINGLE STR (MISCELLANEOUS) ×2 IMPLANT
BUR NEURO DRILL SOFT 3.0X3.8M (BURR) ×2 IMPLANT
CAP LOCKING THREADED (Cap) ×6 IMPLANT
CLEANER CAUTERY TIP 5X5 PAD (MISCELLANEOUS) IMPLANT
CNTNR SPEC 2.5X3XGRAD LEK (MISCELLANEOUS) ×1
CONT SPEC 4OZ STER OR WHT (MISCELLANEOUS) ×1
CONTAINER SPEC 2.5X3XGRAD LEK (MISCELLANEOUS) ×1 IMPLANT
COUNTER NEEDLE 20/40 LG (NEEDLE) ×2 IMPLANT
CUP MEDICINE 2OZ PLAST GRAD ST (MISCELLANEOUS) ×3 IMPLANT
DERMABOND ADVANCED (GAUZE/BANDAGES/DRESSINGS) ×1
DERMABOND ADVANCED .7 DNX12 (GAUZE/BANDAGES/DRESSINGS) ×1 IMPLANT
DRAPE 3D C-ARM OEC (DRAPES) IMPLANT
DRAPE C ARM PK CFD 31 SPINE (DRAPES) IMPLANT
DRAPE C-ARMOR (DRAPES) IMPLANT
DRAPE INCISE IOBAN 66X45 STRL (DRAPES) ×1 IMPLANT
DRAPE LAPAROTOMY 100X77 ABD (DRAPES) ×2 IMPLANT
DRAPE SCAN PATIENT (DRAPES) ×2 IMPLANT
DRAPE SURG 17X11 SM STRL (DRAPES) ×4 IMPLANT
DRAPE UTILITY W/TAPE 26X15 (DRAPES) ×1 IMPLANT
DRSG OPSITE POSTOP 4X8 (GAUZE/BANDAGES/DRESSINGS) IMPLANT
ELECT CAUTERY BLADE TIP 2.5 (TIP)
ELECT EZSTD 165MM 6.5IN (MISCELLANEOUS)
ELECT REM PT RETURN 9FT ADLT (ELECTROSURGICAL) ×2
ELECTRODE CAUTERY BLDE TIP 2.5 (TIP) ×2 IMPLANT
ELECTRODE EZSTD 165MM 6.5IN (MISCELLANEOUS) IMPLANT
ELECTRODE REM PT RTRN 9FT ADLT (ELECTROSURGICAL) ×1 IMPLANT
EX-PIN ORTHOLOCK NAV 4X150 (PIN) IMPLANT
GAUZE 4X4 16PLY ~~LOC~~+RFID DBL (SPONGE) ×1 IMPLANT
GLOVE BIOGEL PI IND STRL 6.5 (GLOVE) ×2 IMPLANT
GLOVE BIOGEL PI INDICATOR 6.5 (GLOVE) ×2
GLOVE SURG SYN 6.5 ES PF (GLOVE) ×8 IMPLANT
GLOVE SURG SYN 6.5 PF PI (GLOVE) ×2 IMPLANT
GLOVE SURG SYN 8.5  E (GLOVE) ×4
GLOVE SURG SYN 8.5 E (GLOVE) ×4 IMPLANT
GLOVE SURG SYN 8.5 PF PI (GLOVE) ×4 IMPLANT
GOWN SRG LRG LVL 4 IMPRV REINF (GOWNS) ×3 IMPLANT
GOWN SRG XL LVL 3 NONREINFORCE (GOWNS) ×1 IMPLANT
GOWN STRL NON-REIN TWL XL LVL3 (GOWNS) ×1
GOWN STRL REIN LRG LVL4 (GOWNS) ×1
HEMOVAC 400CC 10FR (MISCELLANEOUS) ×1 IMPLANT
HOLDER FOLEY CATH W/STRAP (MISCELLANEOUS) ×1 IMPLANT
INTERBODY SABLE 10X26 7-14 15D (Miscellaneous) ×2 IMPLANT
KIT PREVENA INCISION MGT 13 (CANNISTER) IMPLANT
KIT PREVENA INCISION MGT20CM45 (CANNISTER) ×1 IMPLANT
KIT SPINAL PRONEVIEW (KITS) ×2 IMPLANT
KNIFE BAYONET SHORT DISCETOMY (MISCELLANEOUS) IMPLANT
MANIFOLD NEPTUNE II (INSTRUMENTS) ×2 IMPLANT
MARKER SKIN DUAL TIP RULER LAB (MISCELLANEOUS) ×4 IMPLANT
MARKER SPHERE PSV REFLC 13MM (MARKER) ×14 IMPLANT
NDL SAFETY ECLIPSE 18X1.5 (NEEDLE) ×1 IMPLANT
NEEDLE HYPO 18GX1.5 SHARP (NEEDLE) ×1
NS IRRIG 1000ML POUR BTL (IV SOLUTION) ×2 IMPLANT
PACK LAMINECTOMY NEURO (CUSTOM PROCEDURE TRAY) ×2 IMPLANT
PAD CLEANER CAUTERY TIP 5X5 (MISCELLANEOUS) ×1
PENCIL ELECTRO HAND CTR (MISCELLANEOUS) ×2 IMPLANT
ROD 65MM SPINAL (Rod) ×2 IMPLANT
ROD SPNL 5.5 CREO TI 65 (Rod) IMPLANT
SCREW CREO SPINAL 6.5X45 (Screw) ×4 IMPLANT
SCREW CREO SPINAL 7.5X45 (Screw) ×2 IMPLANT
SOLUTION IRRIG SURGIPHOR (IV SOLUTION) ×3 IMPLANT
SPONGE GAUZE 2X2 8PLY STRL LF (GAUZE/BANDAGES/DRESSINGS) ×3 IMPLANT
STAPLER SKIN PROX 35W (STAPLE) IMPLANT
SURGIFLO W/THROMBIN 8M KIT (HEMOSTASIS) ×3 IMPLANT
SUT DVC VLOC 3-0 CL 6 P-12 (SUTURE) ×1 IMPLANT
SUT ETHILON 3-0 FS-10 30 BLK (SUTURE) ×2
SUT V-LOC 90 ABS DVC 3-0 CL (SUTURE) ×1 IMPLANT
SUT VIC AB 0 CT1 27 (SUTURE) ×2
SUT VIC AB 0 CT1 27XCR 8 STRN (SUTURE) ×1 IMPLANT
SUT VIC AB 2-0 CT1 18 (SUTURE) ×4 IMPLANT
SUTURE EHLN 3-0 FS-10 30 BLK (SUTURE) IMPLANT
SYR 10ML LL (SYRINGE) ×2 IMPLANT
SYR 30ML LL (SYRINGE) ×4 IMPLANT
TRAY FOLEY SLVR 16FR LF STAT (SET/KITS/TRAYS/PACK) ×1 IMPLANT
WATER STERILE IRR 1000ML POUR (IV SOLUTION) ×3 IMPLANT
WATER STERILE IRR 500ML POUR (IV SOLUTION) IMPLANT

## 2021-10-26 NOTE — Progress Notes (Signed)
Patient denies pain throughout stay in PACU. Utilized [place] interpretative services on the mobile ipad. Patient questions answered to satisfaction. Dentures and eye glasses returned to patient.

## 2021-10-26 NOTE — Op Note (Addendum)
Indications: Mr. Motta is a 71 yo male who presented with Anterolisthesis M43.10, Lumbar radiculopathy M54.16.  He failed conservative management prompting surgical intervention.  Findings: correction of anterolisthesis  Preoperative Diagnosis: Anterolisthesis M43.10, Lumbar radiculopathy M54.16 Postoperative Diagnosis: same   EBL: 750 ml IVF: 500 ml colloid, 1000 ml crystalloid Drains: 1 placed Disposition: Extubated and Stable to PACU Complications: none  A foley catheter was placed.   Preoperative Note:   Risks of surgery discussed include: infection, bleeding, stroke, coma, death, paralysis, CSF leak, nerve/spinal cord injury, numbness, tingling, weakness, complex regional pain syndrome, recurrent stenosis and/or disc herniation, vascular injury, development of instability, neck/back pain, need for further surgery, persistent symptoms, development of deformity, and the risks of anesthesia. The patient understood these risks and agreed to proceed.  Operative Note:  1. Transforaminal Lumbar Interbody Fusion L4/5 and L5/S1 2. Posterolateral arthrodesis L4 to S1 3. Posterior segmental instrumentation L4 to S1 using Globus Creo 4. Use of stereotaxis 5. Harvesting of autograft via the same incision 6. Placement of a biomechanical device ([place]) at L4/5 and L5/S1 for anterior arthrodesis    The patient was brought to the Operating Room, intubated and turned into the prone position. All pressure points were checked and double checked. Flouroscopy was used to mark the incision. The patient was prepped and draped in the standard fashion. A full timeout was performed. Preoperative antibiotics were given. The incision was injected with local anesthetic.  The incision was opened with a scalpel, then the soft tissues divided with the Bovie. Self-retaining retractors were placed. The paraspinus muscles were reflected laterally in subperiosteal fashion until the transverse processes  were visible. The stereotactic array was placed, then the GE stereotactic CT was taken and registered to the patient with [employer].  We utilized stereotaxis for placement of all implants.   We then placed pedicle screws using a stereotactic drill guide to cannulate the pedicles from L4 to S1 bilaterally.  At each level, globus Creo implants were placed using guidance with [employer].   After placement of pedicle screws, a screw-to-screw distractor was placed to distract the disc space. We then turned attention to performing the transforaminal decompression and interbody fusion. The left L5/S1 facet was removed with osteotomes and the drill, and handed off for preparation as autograft. The traversing and exiting nerve roots on the left were identified and protected. The disc was opened using a scalpel. After incising the disc space, we took a combination of pituitary rongeurs, Kerrison rongeurs, disc scrapers, and curettes to remove a majority of the disc material.  We prepared the end plates for accepting the interbody fusion.  We removed the cartilaginous plate, preserved the cortical endplate if possible during this procedure.  We serially dilated up in order to increase the size of the disc space, while protecting the traversing and exiting nerve roots, until we had sized up to a trial.    The trial was removed, and the disc space packed with autograft and allograft. The Globus Sable TLIF biomechanical device was inserted, then backfilled with a mixture of allograft and autograft and then inserted, with care taken to protect the nerve roots and thecal sac. After placement of the device, the screw-screw distractor was removed.  After performing the TLIF at L5/S1, a screw-to-screw distractor was placed to distract the L4/5 disc space. We then turned attention to performing the transforaminal decompression and interbody fusion. The left L4/5 facet was removed with osteotomes and the drill, and handed off for  preparation as  autograft. The traversing and exiting nerve roots on the left were identified and protected. The disc was opened using a scalpel. After incising the disc space, we took a combination of pituitary rongeurs, Kerrison rongeurs, disc scrapers, and curettes to remove a majority of the disc material.  We prepared the end plates for accepting the interbody fusion.  We removed the cartilaginous plate, preserved the cortical endplate if possible during this procedure.  We serially dilated up in order to increase the size of the disc space, while protecting the traversing and exiting nerve roots, until we had sized up to a trial.    The trial was removed, and the disc space packed with autograft and allograft. The Globus Sable TLIF biomechanical device was inserted, then backfilled with a mixture of allograft and autograft and then inserted, with care taken to protect the nerve roots and thecal sac. After placement of the device, the screw-screw distractor was removed.  Rods were measured to length, cut, and shaped. The rods were secured using locking caps to manufacturer's specifications. Final Ct was taken to confirm placement of implants. The wound was copiously irrigated, then the external surfaces of the remaining lamina, facet, and transverse processes from L4 to S1 were decorticated. A mixture of allograft and autograft was placed over the decorticated surfaces for arthrodesis.  A drain was placed subfascially.   After hemostasis, the wound was closed in layers with 0 and 2-0 vicryl. 3-0 monocryl and a wound vac  was applied to the incision.  The patient was then flipped supine and extubated with incident. All counts were correct times 2 at the end of the case. No immediate complications were noted.  Cooper Render PA assisted in the entire procedure. An assistant was required for this procedure due to the complexity.  The assistant provided assistance in tissue manipulation and suction, and  was required for the successful and safe performance of the procedure. I performed the critical portions of the procedure.   Meade Maw MD

## 2021-10-26 NOTE — Anesthesia Preprocedure Evaluation (Addendum)
Anesthesia Evaluation  Patient identified by MRN, date of birth, ID band Patient awake    Reviewed: Allergy & Precautions, NPO status , Patient's Chart, lab work & pertinent test results  History of Anesthesia Complications Negative for: history of anesthetic complications  Airway Mallampati: IV   Neck ROM: Full    Dental  (+) Upper Dentures, Lower Dentures   Pulmonary sleep apnea and Continuous Positive Airway Pressure Ventilation , COPD (on 2L home O2 at night),  oxygen dependent, former smoker (quit 2010),    Pulmonary exam normal breath sounds clear to auscultation       Cardiovascular hypertension, Normal cardiovascular exam Rhythm:Regular Rate:Normal     Neuro/Psych negative neurological ROS     GI/Hepatic negative GI ROS,   Endo/Other  diabetes, Type 2Obesity   Renal/GU negative Renal ROS   BPH    Musculoskeletal  (+) Arthritis ,   Abdominal   Peds  Hematology negative hematology ROS (+)   Anesthesia Other Findings   Reproductive/Obstetrics                            Anesthesia Physical Anesthesia Plan  ASA: 3  Anesthesia Plan: General   Post-op Pain Management:    Induction: Intravenous  PONV Risk Score and Plan: 2 and Ondansetron, Dexamethasone and Treatment may vary due to age or medical condition  Airway Management Planned: Oral ETT  Additional Equipment:   Intra-op Plan:   Post-operative Plan: Extubation in OR  Informed Consent: I have reviewed the patients History and Physical, chart, labs and discussed the procedure including the risks, benefits and alternatives for the proposed anesthesia with the patient or authorized representative who has indicated his/her understanding and acceptance.     Dental advisory given  Plan Discussed with: CRNA  Anesthesia Plan Comments: (Patient and wife (translating as needed) at bedside consented for risks of anesthesia  including but not limited to:  - adverse reactions to medications - damage to eyes, teeth, lips or other oral mucosa - nerve damage due to positioning  - sore throat or hoarseness - damage to heart, brain, nerves, lungs, other parts of body or loss of life  Informed patient about role of CRNA in peri- and intra-operative care.  Patient voiced understanding.)      Anesthesia Quick Evaluation

## 2021-10-26 NOTE — Transfer of Care (Signed)
Immediate Anesthesia Transfer of Care Note  Patient: Dean Simpson  Procedure(s) Performed: OPEN L4-S1 TRANSFORAMINAL LUMBAR INTERBODY FUSION (TLIF) (Spine Lumbar) APPLICATION OF INTRAOPERATIVE CT SCAN (Spine Lumbar)  Patient Location: PACU  Anesthesia Type:General  Level of Consciousness: awake, drowsy and patient cooperative  Airway & Oxygen Therapy: Patient Spontanous Breathing and Patient connected to face mask oxygen  Post-op Assessment: Report given to RN and Post -op Vital signs reviewed and stable  Post vital signs: Reviewed and stable  Last Vitals:  Vitals Value Taken Time  BP 126/62 10/26/21 1716  Temp 36.2 C 10/26/21 1711  Pulse 78 10/26/21 1730  Resp 20 10/26/21 1730  SpO2 92 % 10/26/21 1730  Vitals shown include unvalidated device data.  Last Pain:  Vitals:   10/26/21 1127  TempSrc: Oral  PainSc: 7          Complications: No notable events documented.

## 2021-10-26 NOTE — H&P (Signed)
I have reviewed and confirmed my history and physical from 09/29/21 with no additions or changes. Plan for L4-S1 transforaminal interbody fusion.  Risks and benefits reviewed.  Heart sounds normal no MRG. Chest Clear to Auscultation Bilaterally.

## 2021-10-27 ENCOUNTER — Encounter: Payer: Self-pay | Admitting: Neurosurgery

## 2021-10-27 LAB — CBC
HCT: 34.2 % — ABNORMAL LOW (ref 39.0–52.0)
Hemoglobin: 11.2 g/dL — ABNORMAL LOW (ref 13.0–17.0)
MCH: 28.8 pg (ref 26.0–34.0)
MCHC: 32.7 g/dL (ref 30.0–36.0)
MCV: 87.9 fL (ref 80.0–100.0)
Platelets: 238 10*3/uL (ref 150–400)
RBC: 3.89 MIL/uL — ABNORMAL LOW (ref 4.22–5.81)
RDW: 12.5 % (ref 11.5–15.5)
WBC: 11.5 10*3/uL — ABNORMAL HIGH (ref 4.0–10.5)
nRBC: 0 % (ref 0.0–0.2)

## 2021-10-27 LAB — GLUCOSE, CAPILLARY
Glucose-Capillary: 175 mg/dL — ABNORMAL HIGH (ref 70–99)
Glucose-Capillary: 216 mg/dL — ABNORMAL HIGH (ref 70–99)
Glucose-Capillary: 229 mg/dL — ABNORMAL HIGH (ref 70–99)
Glucose-Capillary: 223 mg/dL — ABNORMAL HIGH (ref 70–99)

## 2021-10-27 NOTE — Progress Notes (Signed)
Spoke with the patient's son in the room He will be at home with his son and wife He has some DME at  home to include a rolling walker and cane, will need a 3 in 1 to be delivered by [employer] to the room prior to DC He was accepted by [place] for [facility] services His son will provide transportation

## 2021-10-27 NOTE — Anesthesia Postprocedure Evaluation (Signed)
Anesthesia Post Note  Patient: Dean Simpson  Procedure(s) Performed: OPEN L4-S1 TRANSFORAMINAL LUMBAR INTERBODY FUSION (TLIF) (Spine Lumbar) APPLICATION OF INTRAOPERATIVE CT SCAN (Spine Lumbar)  Patient location during evaluation: PACU Anesthesia Type: General Level of consciousness: awake and alert Pain management: pain level controlled Vital Signs Assessment: post-procedure vital signs reviewed and stable Respiratory status: spontaneous breathing, nonlabored ventilation, respiratory function stable and patient connected to nasal cannula oxygen Cardiovascular status: blood pressure returned to baseline and stable Postop Assessment: no apparent nausea or vomiting Anesthetic complications: no   No notable events documented.   Last Vitals:  Vitals:   10/26/21 2141 10/27/21 0258  BP: 134/60 138/64  Pulse: 83 82  Resp: 17 18  Temp: 36.6 C 36.6 C  SpO2: 97% 96%    Last Pain:  Vitals:   10/27/21 0343  TempSrc:   PainSc: Asleep                 [place] Ouch

## 2021-10-27 NOTE — Plan of Care (Signed)
  Problem: Education: Goal: Ability to describe self-care measures that may prevent or decrease complications (Diabetes Survival Skills Education) will improve Outcome: Progressing   Problem: Health Behavior/Discharge Planning: Goal: Ability to identify and utilize available resources and services will improve Outcome: Progressing   Problem: Metabolic: Goal: Ability to maintain appropriate glucose levels will improve Outcome: Progressing   Problem: Nutritional: Goal: Progress toward achieving an optimal weight will improve Outcome: Progressing   Problem: Skin Integrity: Goal: Risk for impaired skin integrity will decrease Outcome: Progressing   Problem: Education: Goal: Knowledge of General Education information will improve Description: Including pain rating scale, medication(s)/side effects and non-pharmacologic comfort measures Outcome: Progressing   Problem: Health Behavior/Discharge Planning: Goal: Ability to manage health-related needs will improve Outcome: Progressing   Problem: Clinical Measurements: Goal: Ability to maintain clinical measurements within normal limits will improve Outcome: Progressing Goal: Will remain free from infection Outcome: Progressing Goal: Respiratory complications will improve Outcome: Progressing   Problem: Nutrition: Goal: Adequate nutrition will be maintained Outcome: Progressing   Problem: Coping: Goal: Level of anxiety will decrease Outcome: Progressing   Problem: Pain Managment: Goal: General experience of comfort will improve Outcome: Progressing   Problem: Safety: Goal: Ability to remain free from injury will improve Outcome: Progressing

## 2021-10-27 NOTE — Evaluation (Signed)
Physical Therapy Evaluation Patient Details Name: Dean Simpson MRN: [id number] DOB: Dec 12, 1950 Today's Date: 10/27/2021  History of Present Illness  71 y/o male s/p L4-S1 lumbar fusion 7/10.  Clinical Impression  Pt did well with PT exam and subsequent mobility/ambulation.  He was also able to negotiate up/down 4 steps w/o rails using walker and retro strategy.  Pt reports only minimal pain POD1 and has functional and symmetrical LE strength without numbness, or other concerns.  Good overall first bout of prolonged ambulation and activity.       Recommendations for follow up therapy are one component of a multi-disciplinary discharge planning process, led by the attending physician.  Recommendations may be updated based on patient status, additional functional criteria and insurance authorization.  Follow Up Recommendations Follow physician's recommendations for discharge plan and follow up therapies      Assistance Recommended at Discharge Intermittent Supervision/Assistance  Patient can return home with the following  Help with stairs or ramp for entrance;Assistance with cooking/housework;A little help with bathing/dressing/bathroom;Assist for transportation    Equipment Recommendations None recommended by PT  Recommendations for Other Services       Functional Status Assessment Patient has had a recent decline in their functional status and demonstrates the ability to make significant improvements in function in a reasonable and predictable amount of time.     Precautions / Restrictions Precautions Precautions: Fall Precaution Comments: no brace required Restrictions Weight Bearing Restrictions: No      Mobility  Bed Mobility               General bed mobility comments: in chair on arrival, reports that nursing staff helped minimally with LEs    Transfers Overall transfer level: Modified independent Equipment used: Rolling walker (2 wheels)                General transfer comment: cues for appropriate UE use and sequencing, able to rise w/o assist    Ambulation/Gait Ambulation/Gait assistance: Supervision Gait Distance (Feet): 250 Feet Assistive device: Rolling walker (2 wheels)         General Gait Details: Pt with appropraite usage of walker, able to maintain relatively consistent cadence with no LOBs and good overall effort/safety  Stairs Stairs: Yes Stairs assistance: Min guard Stair Management: No rails, Backwards, With walker Number of Stairs: 4 General stair comments: No phyiscal assist and just cuing/AD assist during retro negotiating of steps w/o rails  Wheelchair Mobility    Modified Rankin (Stroke Patients Only)       Balance Overall balance assessment: Modified Independent                                           Pertinent Vitals/Pain Pain Assessment Pain Assessment: 0-10 Pain Score: 4  Pain Location: low back/surgical site    Home Living Family/patient expects to be discharged to:: Private residence Living Arrangements: Spouse/significant other Available Help at Discharge: Available 24 hours/day   Home Access: Stairs to enter Entrance Stairs-Rails: None Entrance Stairs-Number of Steps: 4     Home Equipment: Conservation officer, nature (2 wheels);Cane - single point;Grab bars - toilet      Prior Function Prior Level of Function : Independent/Modified Independent                     Hand Dominance        Extremity/Trunk Assessment  Upper Extremity Assessment Upper Extremity Assessment: Overall WFL for tasks assessed    Lower Extremity Assessment Lower Extremity Assessment: Overall WFL for tasks assessed       Communication   Communication: Prefers language other than English (Requested to have wife act as interpreter)  Cognition Arousal/Alertness: Awake/alert Behavior During Therapy: WFL for tasks assessed/performed Overall Cognitive Status: Within  Functional Limits for tasks assessed                                          General Comments General comments (skin integrity, edema, etc.): Pt doing well POD1    Exercises     Assessment/Plan    PT Assessment Patient needs continued PT services  PT Problem List Decreased strength;Decreased range of motion;Decreased activity tolerance;Decreased balance;Decreased mobility;Decreased knowledge of use of DME;Decreased safety awareness;Pain;Decreased knowledge of precautions       PT Treatment Interventions DME instruction;Gait training;Stair training;Functional mobility training;Therapeutic activities;Therapeutic exercise;Balance training;Neuromuscular re-education;Patient/family education    PT Goals (Current goals can be found in the Care Plan section)  Acute Rehab PT Goals Patient Stated Goal: Go home PT Goal Formulation: With patient/family Time For Goal Achievement: 11/09/21 Potential to Achieve Goals: Fair    Frequency 7X/week     Co-evaluation               AM-PAC PT "6 Clicks" Mobility  Outcome Measure Help needed turning from your back to your side while in a flat bed without using bedrails?: A Little Help needed moving from lying on your back to sitting on the side of a flat bed without using bedrails?: A Little Help needed moving to and from a bed to a chair (including a wheelchair)?: A Little Help needed standing up from a chair using your arms (e.g., wheelchair or bedside chair)?: A Little Help needed to walk in hospital room?: A Little Help needed climbing 3-5 steps with a railing? : A Little 6 Click Score: 18    End of Session Equipment Utilized During Treatment: Gait belt Activity Tolerance: Patient tolerated treatment well Patient left: in chair;with call bell/phone within reach;with family/visitor present Nurse Communication: Mobility status PT Visit Diagnosis: Muscle weakness (generalized) (M62.81);Difficulty in walking, not  elsewhere classified (R26.2);Pain Pain - part of body:  (lumbago)    Time: 2119-4174 PT Time Calculation (min) (ACUTE ONLY): 28 min   Charges:   PT Evaluation $PT Eval Low Complexity: 1 Low PT Treatments $Gait Training: 8-22 mins        Kreg Shropshire, DPT 10/27/2021, 11:20 AM

## 2021-10-27 NOTE — Evaluation (Signed)
Occupational Therapy Evaluation Patient Details Name: Dean Simpson MRN: [id number] DOB: [birth year] Today's Date: 10/27/2021   History of Present Illness 71 y/o male s/p L4-S1 lumbar fusion 7/10.   Clinical Impression   [name] presents with generalized weakness and limited endurance, but overall displayed good fxl mobility post surgery. He was able to perform bed mobility and transfers with RW and SUPV-CGA for safety, displayed good balance, endorses 3/10 pain at surgical site, and strength and ROM in all extremities are [facility]. Pt somewhat lethargic during OT session and therefore declined engaging in ADLs. Pt could benefit from additional OT session while hospitalized to trial toileting, dressing, grooming in standing. Do not anticipate any further OT services required post DC.    Recommendations for follow up therapy are one component of a multi-disciplinary discharge planning process, led by the attending physician.  Recommendations may be updated based on patient status, additional functional criteria and insurance authorization.   Follow Up Recommendations  No OT follow up    Assistance Recommended at Discharge Intermittent Supervision/Assistance  Patient can return home with the following A little help with bathing/dressing/bathroom    Functional Status Assessment  Patient has had a recent decline in their functional status and demonstrates the ability to make significant improvements in function in a reasonable and predictable amount of time.  Equipment Recommendations  BSC/3in1    Recommendations for Other Services       Precautions / Restrictions Precautions Precautions: Fall Precaution Comments: no brace required Restrictions Weight Bearing Restrictions: No      Mobility Bed Mobility Overal bed mobility: Needs Assistance Bed Mobility: Sidelying to Sit, Sit to Sidelying   Sidelying to sit: Min guard     Sit to sidelying: Min guard       Transfers Overall transfer level: Needs assistance Equipment used: Rolling walker (2 wheels) Transfers: Sit to/from Stand Sit to Stand: Supervision                  Balance Overall balance assessment: Needs assistance Sitting-balance support: No upper extremity supported Sitting balance-Leahy Scale: Good     Standing balance support: Bilateral upper extremity supported Standing balance-Leahy Scale: Good                             ADL either performed or assessed with clinical judgement   ADL Overall ADL's : Needs assistance/impaired                                             Vision         Perception     Praxis      Pertinent Vitals/Pain Pain Assessment Pain Score: 3  Pain Location: low back/surgical site     Hand Dominance     Extremity/Trunk Assessment Upper Extremity Assessment Upper Extremity Assessment: Overall WFL for tasks assessed   Lower Extremity Assessment Lower Extremity Assessment: Overall WFL for tasks assessed       Communication Communication Communication: Prefers language other than [place] (wife or son can serve as interpreters)   Cognition Arousal/Alertness: Lethargic, Suspect due to medications Behavior During Therapy: WFL for tasks assessed/performed Overall Cognitive Status: Within Functional Limits for tasks assessed  General Comments       Exercises Other Exercises Other Exercises: Educ re: falls prevention, ECS, PoC   Shoulder Instructions      Home Living Family/patient expects to be discharged to:: Private residence Living Arrangements: Spouse/significant other Available Help at Discharge: Available 24 hours/day Type of Home: House Home Access: Stairs to enter [employer] of Steps: 4 Entrance Stairs-Rails: None           Bathroom Toilet: Standard     Home Equipment: Conservation officer, nature (2 wheels);Cane -  single point;Grab bars - toilet          Prior Functioning/Environment Prior Level of Function : Independent/Modified Independent                        OT Problem List: Decreased activity tolerance;Decreased strength      OT Treatment/Interventions: Self-care/ADL training;Therapeutic exercise;Patient/family education;Balance training;Energy conservation;DME and/or AE instruction;Therapeutic activities    OT Goals(Current goals can be found in the care plan section) Acute Rehab OT Goals Patient Stated Goal: to be pain free OT Goal Formulation: With patient Time For Goal Achievement: 11/10/21 Potential to Achieve Goals: Good ADL Goals Pt Will Perform Lower Body Dressing: with modified independence;sitting/lateral leans;sit to/from stand Pt Will Transfer to Toilet: with modified independence;stand pivot transfer (using LRAD) Pt Will Perform Tub/Shower Transfer: with modified independence;ambulating;Stand pivot transfer  OT Frequency: Min 2X/week    Co-evaluation              AM-PAC OT "6 Clicks" Daily Activity     Outcome Measure Help from another person eating meals?: None Help from another person taking care of personal grooming?: None Help from another person toileting, which includes using toliet, bedpan, or urinal?: A Little Help from another person bathing (including washing, rinsing, drying)?: A Little Help from another person to put on and taking off regular upper body clothing?: None Help from another person to put on and taking off regular lower body clothing?: A Little 6 Click Score: 21   End of Session Equipment Utilized During Treatment: Rolling walker (2 wheels)  Activity Tolerance: Patient tolerated treatment well Patient left: in bed;with bed alarm set;with call bell/phone within reach;with family/visitor present  OT Visit Diagnosis: Muscle weakness (generalized) (M62.81)                Time: 1452-1500 OT Time Calculation (min): 8  min Charges:  OT General Charges $OT Visit: 1 Visit OT Evaluation $OT Eval Low Complexity: 1 Low OT Treatments $Self Care/Home Management : 8-22 mins Josiah Lobo, PhD, MS, OTR/L 10/27/21, 4:37 PM

## 2021-10-27 NOTE — Progress Notes (Cosign Needed)
Patient is not able to walk the distance required to go the bathroom, or he/she is unable to safely negotiate stairs required to access the bathroom.  A 3in1 BSC will alleviate this problem  

## 2021-10-27 NOTE — Progress Notes (Signed)
    Attending Progress Note  History: Dean Simpson is here for L4-S1 TLIF   POD2: Some lightheadedness and ambulating to the bedside chair this morning but reports well-controlled back pain.  Physical Exam: Vitals:   10/27/21 0724 10/27/21 0736  BP: (!) 115/48   Pulse: 77   Resp: 17   Temp: 98.3 F (36.8 C)   SpO2: 96% 92%    AA Ox3 CNI  Strength:5/5 throughout BLE  HV 388 since surgery.  Incisional wound vac in place  Data:  No results for input(s): "NA", "K", "CL", "CO2", "BUN", "CREATININE", "LABGLOM", "GLUCOSE", "CALCIUM" in the last 168 hours. No results for input(s): "AST", "ALT", "ALKPHOS" in the last 168 hours.  Invalid input(s): "TBILI"   Recent Labs  Lab 10/27/21 0531  WBC 11.5*  HGB 11.2*  HCT 34.2*  PLT 238   No results for input(s): "APTT", "INR" in the last 168 hours.       Other tests/results: none   Assessment/Plan:  Kyri Georgios Depasquale a 71 y.o s/p L4-S1 TLIF on 10/26/21  - mobilize - pain control - DVT prophylaxis - PTOT - will keep HV and wound vac in place  Cooper Render PA-C Department of Neurosurgery

## 2021-10-28 LAB — GLUCOSE, CAPILLARY
Glucose-Capillary: 230 mg/dL — ABNORMAL HIGH (ref 70–99)
Glucose-Capillary: 231 mg/dL — ABNORMAL HIGH (ref 70–99)
Glucose-Capillary: 195 mg/dL — ABNORMAL HIGH (ref 70–99)
Glucose-Capillary: 179 mg/dL — ABNORMAL HIGH (ref 70–99)

## 2021-10-28 MED ORDER — ORAL CARE MOUTH RINSE: 15.0000 mL | OROMUCOSAL | Status: DC | PRN

## 2021-10-28 NOTE — Progress Notes (Signed)
Physical Therapy Treatment Patient Details Name: Dean Simpson MRN: [id number] DOB: 1950/05/18 Today's Date: 10/28/2021   History of Present Illness 71 y/o male s/p L4-S1 lumbar fusion 7/10.    PT Comments    Pt was able to ambulate 2 loops this session showing good effort and overall ability to maintain consistent speed/cadence.  He did display some apparent increased need of UE reliance on the walker and also displayed slight limp (L>R).  Pt was safe and though he did c/o more pain today than yesterday it neither hindered nor increased his activity/mobility.  Pt continues to make good/functional post-op gains.  Recommendations for follow up therapy are one component of a multi-disciplinary discharge planning process, led by the attending physician.  Recommendations may be updated based on patient status, additional functional criteria and insurance authorization.  Follow Up Recommendations  Follow physician's recommendations for discharge plan and follow up therapies     Assistance Recommended at Discharge Intermittent Supervision/Assistance  Patient can return home with the following Help with stairs or ramp for entrance;Assistance with cooking/housework;A little help with bathing/dressing/bathroom;Assist for transportation   Equipment Recommendations  None recommended by PT    Recommendations for Other Services       Precautions / Restrictions Precautions Precautions: Fall Precaution Comments: no brace required Restrictions Weight Bearing Restrictions: No     Mobility  Bed Mobility Overal bed mobility: Needs Assistance Bed Mobility: Sidelying to Sit, Sit to Sidelying, Rolling Rolling: Supervision Sidelying to sit: Supervision     Sit to sidelying: Min guard      Transfers Overall transfer level: Needs assistance Equipment used: Rolling walker (2 wheels) Transfers: Sit to/from Stand, Bed to chair/wheelchair/BSC Sit to Stand: Supervision, From  elevated surface           General transfer comment: cueing for UE use,cues for spinal neutral awareness    Ambulation/Gait Ambulation/Gait assistance: Supervision Gait Distance (Feet): 350 Feet Assistive device: Rolling walker (2 wheels)         General Gait Details: Pt with appropraite usage of walker, able to maintain relatively consistent cadence with no LOBs and good overall effort/safety.  Did appear to have more UE reliance today than yesterday as well as some low grade limping on the L   Stairs             Wheelchair Mobility    Modified Rankin (Stroke Patients Only)       Balance Overall balance assessment: Needs assistance Sitting-balance support: No upper extremity supported Sitting balance-Leahy Scale: Good     Standing balance support: Bilateral upper extremity supported Standing balance-Leahy Scale: Fair                              Cognition Arousal/Alertness: Awake/alert Behavior During Therapy: WFL for tasks assessed/performed Overall Cognitive Status: Within Functional Limits for tasks assessed                                          Exercises General Exercises - Lower Extremity Ankle Circles/Pumps: AROM, 10 reps Long Arc Quad: Strengthening, 10 reps Heel Slides: Strengthening, 10 reps Hip ABduction/ADduction: Strengthening, 10 reps Hip Flexion/Marching: Strengthening, 10 reps    General Comments General comments (skin integrity, edema, etc.): Pt with more pain and slighly more UE reliance during ambulation but able to do more distance w/o excessive fatigue,  great effort/attitude      Pertinent Vitals/Pain Pain Assessment Pain Score: 4  Pain Location: minimal pain at rest, but reports generally more pain today than yesterday with/post all activity    Home Living                          Prior Function            PT Goals (current goals can now be found in the care plan section)  Progress towards PT goals: Progressing toward goals    Frequency    7X/week      PT Plan Current plan remains appropriate    Co-evaluation              AM-PAC PT "6 Clicks" Mobility   Outcome Measure  Help needed turning from your back to your side while in a flat bed without using bedrails?: A Little Help needed moving from lying on your back to sitting on the side of a flat bed without using bedrails?: A Little Help needed moving to and from a bed to a chair (including a wheelchair)?: A Little Help needed standing up from a chair using your arms (e.g., wheelchair or bedside chair)?: A Little Help needed to walk in hospital room?: A Little Help needed climbing 3-5 steps with a railing? : A Little 6 Click Score: 18    End of Session Equipment Utilized During Treatment: Gait belt Activity Tolerance: Patient tolerated treatment well Patient left: in chair;with call bell/phone within reach;with family/visitor present Nurse Communication: Mobility status PT Visit Diagnosis: Muscle weakness (generalized) (M62.81);Difficulty in walking, not elsewhere classified (R26.2);Pain Pain - part of body:  (lumbago)     Time: 1125-1150 PT Time Calculation (min) (ACUTE ONLY): 25 min  Charges:  $Gait Training: 8-22 mins $Therapeutic Exercise: 8-22 mins                     Kreg Shropshire, DPT 10/28/2021, 3:39 PM

## 2021-10-28 NOTE — Progress Notes (Signed)
Occupational Therapy Treatment Patient Details Name: Dean Simpson MRN: [id number] DOB: Jul 17, 1950 Today's Date: 10/28/2021   History of present illness 71 y/o male s/p L4-S1 lumbar fusion 7/10.   OT comments  [name] continues to present with generalized weakness and limited endurance, and endorses a sharp increase in pain with OOB movement (3/10 pain in supine; with standing/ambulation/sitting, pt reports "a whole lot" of pain). His OOB fxl mobility was less steady today than it was yesterday, and he displayed poor safety awareness with transfers, several times beginning to sit (2/2 to pain) before he was fully in front of chair/bed. Without sharp correction and verbal/tactile cueing, pt would have sat down without support under him. Pt's standing balance was good, but he was reluctant to engage in many ADL tasks. Had originally anticipated that pt could DC without HHOT, but have modified recommendations at this point. Pt's pain, poor safety awareness, and limited endurance for movement increases his falls risk and will impact his ability to return to PLOF. Now recommend HHOT as well as HHPT following DC.     Recommendations for follow up therapy are one component of a multi-disciplinary discharge planning process, led by the attending physician.  Recommendations may be updated based on patient status, additional functional criteria and insurance authorization.    Follow Up Recommendations  Home health OT    Assistance Recommended at Discharge Intermittent Supervision/Assistance  Patient can return home with the following  A little help with bathing/dressing/bathroom;A little help with walking and/or transfers;Assistance with cooking/housework;Help with stairs or ramp for entrance   Equipment Recommendations       Recommendations for Other Services      Precautions / Restrictions Precautions Precautions: Fall Precaution Comments: no brace  required Restrictions Weight Bearing Restrictions: No       Mobility Bed Mobility Overal bed mobility: Needs Assistance Bed Mobility: Sidelying to Sit, Sit to Sidelying, Rolling Rolling: Supervision Sidelying to sit: Supervision     Sit to sidelying: Min guard      Transfers Overall transfer level: Needs assistance Equipment used: Rolling walker (2 wheels) Transfers: Sit to/from Stand, Bed to chair/wheelchair/BSC Sit to Stand: Supervision, From elevated surface     Step pivot transfers: Min assist     General transfer comment: cueing for hands on RW, pt demonstrates poor safety awareness during transfer     Balance Overall balance assessment: Needs assistance Sitting-balance support: No upper extremity supported Sitting balance-Leahy Scale: Good     Standing balance support: Bilateral upper extremity supported Standing balance-Leahy Scale: Fair                             ADL either performed or assessed with clinical judgement   ADL                                              Extremity/Trunk Assessment Upper Extremity Assessment Upper Extremity Assessment: Overall WFL for tasks assessed   Lower Extremity Assessment Lower Extremity Assessment: Overall WFL for tasks assessed        Vision       Perception     Praxis      Cognition Arousal/Alertness: Awake/alert Behavior During Therapy: WFL for tasks assessed/performed Overall Cognitive Status: Within Functional Limits for tasks assessed  Exercises Other Exercises Other Exercises: Educ re: falls prevention, ECS, safe use of RW, importance of OOB movement    Shoulder Instructions       General Comments      Pertinent Vitals/ Pain       Pain Assessment Pain Score: 3  Pain Location: low back/surgical site  Home Living                                          Prior  Functioning/Environment              Frequency  Min 2X/week        Progress Toward Goals  OT Goals(current goals can now be found in the care plan section)  Progress towards OT goals: Progressing toward goals  Acute Rehab OT Goals OT Goal Formulation: With patient Time For Goal Achievement: 11/10/21 Potential to Achieve Goals: Good  Plan Frequency remains appropriate;Discharge plan needs to be updated    Co-evaluation                 AM-PAC OT "6 Clicks" Daily Activity     Outcome Measure   Help from another person eating meals?: None Help from another person taking care of personal grooming?: A Little Help from another person toileting, which includes using toliet, bedpan, or urinal?: A Lot Help from another person bathing (including washing, rinsing, drying)?: A Little Help from another person to put on and taking off regular upper body clothing?: A Little Help from another person to put on and taking off regular lower body clothing?: A Lot 6 Click Score: 17    End of Session Equipment Utilized During Treatment: Rolling walker (2 wheels)  OT Visit Diagnosis: Muscle weakness (generalized) (M62.81)   Activity Tolerance Patient tolerated treatment well   Patient Left in bed;with bed alarm set;with call bell/phone within reach;with family/visitor present   Nurse Communication          Time: 4656-8127 OT Time Calculation (min): 10 min  Charges: OT General Charges $OT Visit: 1 Visit OT Treatments $Self Care/Home Management : 8-22 mins  Josiah Lobo, PhD, MS, OTR/L 10/28/21, 3:30 PM

## 2021-10-28 NOTE — TOC Progression Note (Signed)
Transition of Care [facility]) - Progression Note    Patient Details  Name: Dean Simpson MRN: [id number] Date of Birth: [birth year]  Transition of Care [facility]) CM/SW [place], RN Phone Number: 10/28/2021, 4:18 PM  Clinical Narrative:     Notified Meg with [facility] to please add OT to the [facility] for the patient  Expected Discharge Plan: [place] Barriers to Discharge: Barriers Resolved  Expected Discharge Plan and Services Expected Discharge Plan: [place]   Discharge Planning Services: CM Consult   Living arrangements for the past 2 months: Single Family Home                 DME Arranged: 3-N-1 DME Agency: [employer] Date DME Agency Contacted: 10/27/21 Time DME Agency Contacted: [phone] Representative spoke with at DME Agency: [place]: PT [place]: [place] Date [place]: 10/27/21 Time [place]: [id number] Representative spoke with at [place]: Meg   Social Determinants of Health ([place]) Interventions    Readmission Risk Interventions     No data to display

## 2021-10-28 NOTE — Plan of Care (Signed)
  Problem: Education: Goal: Ability to describe self-care measures that may prevent or decrease complications (Diabetes Survival Skills Education) will improve Outcome: Progressing   Problem: Coping: Goal: Ability to adjust to condition or change in health will improve Outcome: Progressing   Problem: Nutritional: Goal: Maintenance of adequate nutrition will improve Outcome: Progressing   Problem: Skin Integrity: Goal: Risk for impaired skin integrity will decrease Outcome: Progressing   

## 2021-10-28 NOTE — Progress Notes (Signed)
    Attending Progress Note  History: Dean Simpson is here for L4-S1 TLIF   POD2: pt had good night with minimal need for pain medication  POD1: Some lightheadedness and ambulating to the bedside chair this morning but reports well-controlled back pain.  Physical Exam: Vitals:   10/27/21 2132 10/28/21 0523  BP: (!) 148/74 (!) 151/73  Pulse: (!) 103 96  Resp: 17 16  Temp: 99.3 F (37.4 C) 98.2 F (36.8 C)  SpO2: 93% 99%    AA Ox3 CNI  Strength:5/5 throughout BLE  HV 80 yesterday Incisional wound vac in place  Data:  No results for input(s): "NA", "K", "CL", "CO2", "BUN", "CREATININE", "LABGLOM", "GLUCOSE", "CALCIUM" in the last 168 hours. No results for input(s): "AST", "ALT", "ALKPHOS" in the last 168 hours.  Invalid input(s): "TBILI"   Recent Labs  Lab 10/27/21 0531  WBC 11.5*  HGB 11.2*  HCT 34.2*  PLT 238    No results for input(s): "APTT", "INR" in the last 168 hours.       Other tests/results: none   Assessment/Plan:  Selassie Georgios Sharples a 71 y.o s/p L4-S1 TLIF on 10/26/21  - mobilize - pain control - DVT prophylaxis - PTOT - will keep wound vac in place - likely remove HV this afternoon  Cooper Render PA-C Department of Neurosurgery

## 2021-10-29 ENCOUNTER — Encounter: Payer: Self-pay | Admitting: Neurosurgery

## 2021-10-29 LAB — GLUCOSE, CAPILLARY
Glucose-Capillary: 154 mg/dL — ABNORMAL HIGH (ref 70–99)
Glucose-Capillary: 192 mg/dL — ABNORMAL HIGH (ref 70–99)
Glucose-Capillary: 198 mg/dL — ABNORMAL HIGH (ref 70–99)
Glucose-Capillary: 147 mg/dL — ABNORMAL HIGH (ref 70–99)

## 2021-10-29 NOTE — Progress Notes (Addendum)
    Attending Progress Note  History: Dean Simpson is here for L4-S1 TLIF   POD3: Difficult night last night in regards to back pain. Improved this morning   POD2: pt had good night with minimal need for pain medication  POD1: Some lightheadedness and ambulating to the bedside chair this morning but reports well-controlled back pain.  Physical Exam: Vitals:   10/29/21 0804 10/29/21 0815  BP:  138/70  Pulse: 68 94  Resp: 18 18  Temp:  98.7 F (37.1 C)  SpO2: 94% 96%    AA Ox3 CNI  Strength:5/5 throughout BLE  Incisional wound vac in place  Data:  No results for input(s): "NA", "K", "CL", "CO2", "BUN", "CREATININE", "LABGLOM", "GLUCOSE", "CALCIUM" in the last 168 hours. No results for input(s): "AST", "ALT", "ALKPHOS" in the last 168 hours.  Invalid input(s): "TBILI"   Recent Labs  Lab 10/27/21 0531  WBC 11.5*  HGB 11.2*  HCT 34.2*  PLT 238    No results for input(s): "APTT", "INR" in the last 168 hours.       Other tests/results: none   Assessment/Plan:  Dean Simpson a 71 y.o s/p L4-S1 TLIF on 10/26/21  - mobilize - pain control - DVT prophylaxis - PTOT; dispo planning underway. Pt established with [facility] pre-op - will keep wound vac in place - HV removed 7/12  Cooper Render PA-C Department of Neurosurgery

## 2021-10-29 NOTE — Care Management Important Message (Signed)
Important Message  Patient Details  Name: Dean Simpson MRN: [id number] Date of Birth: [birth year]   Medicare Important Message Given:  N/A - LOS <3 / Initial given by admissions     Juliann Pulse A Brentlee Sciara 10/29/2021, 8:26 AM

## 2021-10-29 NOTE — Progress Notes (Signed)
Inpatient Diabetes Program Recommendations  AACE/ADA: New Consensus Statement on Inpatient Glycemic Control (2015)  Target Ranges:  Prepandial:   less than 140 mg/dL      Peak postprandial:   less than 180 mg/dL (1-2 hours)      Critically ill patients:  140 - 180 mg/dL   Lab Results  Component Value Date   GLUCAP 154 (H) 10/29/2021    Review of Glycemic Control  Latest Reference Range & Units 10/28/21 08:20 10/28/21 12:22 10/28/21 16:57 10/28/21 21:39 10/29/21 08:15  Glucose-Capillary 70 - 99 mg/dL 230 (H) 231 (H) 195 (H) 179 (H) 154 (H)   Diabetes history: DM 2 Outpatient Diabetes medications: Invokana 300 mg Daily, Amaryl 4 mg Daily, Metformin 1000 mg BID, Ozempic weekly Current orders for Inpatient glycemic control:  Amaryl 4 mg Daily Metformin 1000 mg bid Novolog 0-15 units tid + hs  Inpatient Diabetes Program Recommendations:    Consult for glucose control postop  Note Pt received Decadron 10 mg on 7/10. Effects last for 48-72 hours.  Glucose trends this am back down to the 150's. Watch trends for now since steroids are clearing.  Thanks, Tama Headings RN, MSN, BC-ADM Inpatient Diabetes Coordinator Team Pager [phone] (8a-5p)

## 2021-10-29 NOTE — Progress Notes (Signed)
PT Cancellation Note  Patient Details Name: Dean Simpson MRN: [id number] DOB: [birth year]   Cancelled Treatment:    Reason Eval/Treat Not Completed: Medical issues which prohibited therapy  Declined initially wanting pain meds to take affect first.  Returned later and pt vomiting.  Tech notified and assisted.   Chesley Noon 10/29/2021, 12:46 PM

## 2021-10-29 NOTE — Plan of Care (Signed)
Problem: Education: Goal: Ability to describe self-care measures that may prevent or decrease complications (Diabetes Survival Skills Education) will improve 10/29/2021 1434 by Loreen Freud, RN Outcome: Progressing 10/29/2021 1204 by Loreen Freud, RN Outcome: Progressing Goal: Individualized Educational Video(s) 10/29/2021 1434 by Loreen Freud, RN Outcome: Progressing 10/29/2021 1204 by Loreen Freud, RN Outcome: Progressing   Problem: Coping: Goal: Ability to adjust to condition or change in health will improve 10/29/2021 1434 by Loreen Freud, RN Outcome: Progressing 10/29/2021 1204 by Loreen Freud, RN Outcome: Progressing   Problem: Fluid Volume: Goal: Ability to maintain a balanced intake and output will improve 10/29/2021 1434 by Loreen Freud, RN Outcome: Progressing 10/29/2021 1204 by Loreen Freud, RN Outcome: Progressing   Problem: Health Behavior/Discharge Planning: Goal: Ability to identify and utilize available resources and services will improve 10/29/2021 1434 by Loreen Freud, RN Outcome: Progressing 10/29/2021 1204 by Loreen Freud, RN Outcome: Progressing Goal: Ability to manage health-related needs will improve 10/29/2021 1434 by Loreen Freud, RN Outcome: Progressing 10/29/2021 1204 by Loreen Freud, RN Outcome: Progressing   Problem: Metabolic: Goal: Ability to maintain appropriate glucose levels will improve 10/29/2021 1434 by Loreen Freud, RN Outcome: Progressing 10/29/2021 1204 by Loreen Freud, RN Outcome: Progressing   Problem: Nutritional: Goal: Maintenance of adequate nutrition will improve 10/29/2021 1434 by Loreen Freud, RN Outcome: Progressing 10/29/2021 1204 by Loreen Freud, RN Outcome: Progressing Goal: Progress toward achieving an optimal weight will improve 10/29/2021 1434 by Loreen Freud, RN Outcome: Progressing 10/29/2021 1204 by Loreen Freud, RN Outcome: Progressing   Problem: Skin  Integrity: Goal: Risk for impaired skin integrity will decrease 10/29/2021 1434 by Loreen Freud, RN Outcome: Progressing 10/29/2021 1204 by Loreen Freud, RN Outcome: Progressing   Problem: Tissue Perfusion: Goal: Adequacy of tissue perfusion will improve 10/29/2021 1434 by Loreen Freud, RN Outcome: Progressing 10/29/2021 1204 by Loreen Freud, RN Outcome: Progressing   Problem: Education: Goal: Knowledge of General Education information will improve Description: Including pain rating scale, medication(s)/side effects and non-pharmacologic comfort measures 10/29/2021 1434 by Loreen Freud, RN Outcome: Progressing 10/29/2021 1204 by Loreen Freud, RN Outcome: Progressing   Problem: Health Behavior/Discharge Planning: Goal: Ability to manage health-related needs will improve 10/29/2021 1434 by Loreen Freud, RN Outcome: Progressing 10/29/2021 1204 by Loreen Freud, RN Outcome: Progressing   Problem: Clinical Measurements: Goal: Ability to maintain clinical measurements within normal limits will improve 10/29/2021 1434 by Loreen Freud, RN Outcome: Progressing 10/29/2021 1204 by Loreen Freud, RN Outcome: Progressing Goal: Will remain free from infection 10/29/2021 1434 by Loreen Freud, RN Outcome: Progressing 10/29/2021 1204 by Loreen Freud, RN Outcome: Progressing Goal: Diagnostic test results will improve 10/29/2021 1434 by Loreen Freud, RN Outcome: Progressing 10/29/2021 1204 by Loreen Freud, RN Outcome: Progressing Goal: Respiratory complications will improve 10/29/2021 1434 by Loreen Freud, RN Outcome: Progressing 10/29/2021 1204 by Loreen Freud, RN Outcome: Progressing Goal: Cardiovascular complication will be avoided 10/29/2021 1434 by Loreen Freud, RN Outcome: Progressing 10/29/2021 1204 by Loreen Freud, RN Outcome: Progressing   Problem: Activity: Goal: Risk for activity intolerance will decrease 10/29/2021 1434 by Loreen Freud, RN Outcome: Progressing 10/29/2021 1204 by Loreen Freud, RN Outcome: Progressing   Problem: Nutrition: Goal: Adequate nutrition will be maintained 10/29/2021 1434 by Loreen Freud, RN Outcome: Progressing 10/29/2021 1204 by Loreen Freud, RN Outcome: Progressing   Problem: Coping: Goal: Level of anxiety will decrease 10/29/2021 1434 by Loreen Freud, RN Outcome: Progressing 10/29/2021 1204 by Loreen Freud, RN Outcome: Progressing   Problem: Elimination: Goal: Will not experience complications related to bowel motility 10/29/2021 1434  by Loreen Freud, RN Outcome: Progressing 10/29/2021 1204 by Loreen Freud, RN Outcome: Progressing Goal: Will not experience complications related to urinary retention 10/29/2021 1434 by Loreen Freud, RN Outcome: Progressing 10/29/2021 1204 by Loreen Freud, RN Outcome: Progressing   Problem: Pain Managment: Goal: General experience of comfort will improve 10/29/2021 1434 by Loreen Freud, RN Outcome: Progressing 10/29/2021 1204 by Loreen Freud, RN Outcome: Progressing   Problem: Safety: Goal: Ability to remain free from injury will improve 10/29/2021 1434 by Loreen Freud, RN Outcome: Progressing 10/29/2021 1204 by Loreen Freud, RN Outcome: Progressing   Problem: Skin Integrity: Goal: Risk for impaired skin integrity will decrease 10/29/2021 1434 by Loreen Freud, RN Outcome: Progressing 10/29/2021 1204 by Loreen Freud, RN Outcome: Progressing

## 2021-10-29 NOTE — Discharge Instructions (Addendum)
NEUROSURGERY DISCHARGE INSTRUCTIONS  Admission diagnosis: S/P lumbar fusion [Z98.1]  Operative procedure:  L4-S1 TLIF   What to do after you leave the hospital:  Recommended diet: diabetic diet. Increase protein intake to promote wound healing.  Recommended activity: no lifting, driving, or strenuous exercise for 4 weeks . You should walk multiple times per day  Special Instructions  No straining, no heavy lifting > 10lbs x 4 weeks.  Keep incision area clean and dry. May shower in 2 days. No baths or pools for 6 weeks.  Please remove dressing tomorrow, no need to apply a bandage afterwards  You have no sutures to remove, the skin is closed with adhesive  Please take pain medications as directed. Take a stool softener if on pain medications   Please Report any of the following: Nausea or Vomiting, Temperature is greater than 101.73F (38.1C) degrees, Dizziness, Abdominal Pain, Difficulty Breathing or Shortness of Breath, Inability to Eat, drink Fluids, or Take medications, Bleeding, swelling, or drainage from surgical incision sites, New numbness or weakness, and Bowel or bladder dysfunction to the neurosurgeon on call at [phone]  Additional Follow up appointments Please follow up with Cooper Render PA-C in [place] [facility] as scheduled in 2-3 weeks   Please see below for scheduled appointments:  Future Appointments  Date Time Provider [place]  11/10/2021 11:30 AM Loleta Dicker, PA AS-AS None  12/08/2021  2:45 PM Meade Maw, MD AS-AS None

## 2021-10-29 NOTE — Progress Notes (Signed)
Notified MD Izora Ribas that  the patient is complaining of dizziness after retiurning from restroom. Stated to the provider that he had his morning medications and he is sitting in recliner. Elevated his legs to see if it would help subside dizziness but patient states he felt more dizzy. MD Izora Ribas stated to give him some time and hold his BP meds.

## 2021-10-29 NOTE — Progress Notes (Signed)
Physical Therapy Treatment Patient Details Name: Dean Simpson MRN: [id number] DOB: 10-17-1950 Today's Date: 10/29/2021   History of Present Illness 71 y/o male s/p L4-S1 lumbar fusion 7/10.    PT Comments    Pt received in supine position and agreeable to therapy.  Pt performed transfers well today and required minial cuing for proper technique in order to come upright.  Pt able to sfely navigate the room with a rolling walker and participate in ambulation around the nursing station x2.  Pt then returned to the room and was left in the chair with all needs met and wife in room.  Current discharge plans to follow MD's recommendations remain appropriate at this time.  Pt will continue to benefit from skilled therapy in order to address deficits listed below.    Recommendations for follow up therapy are one component of a multi-disciplinary discharge planning process, led by the attending physician.  Recommendations may be updated based on patient status, additional functional criteria and insurance authorization.  Follow Up Recommendations  Follow physician's recommendations for discharge plan and follow up therapies     Assistance Recommended at Discharge Intermittent Supervision/Assistance  Patient can return home with the following Help with stairs or ramp for entrance;Assistance with cooking/housework;A little help with bathing/dressing/bathroom;Assist for transportation   Equipment Recommendations  None recommended by PT    Recommendations for Other Services       Precautions / Restrictions Precautions Precautions: Fall Precaution Comments: no brace required Restrictions Weight Bearing Restrictions: No     Mobility  Bed Mobility Overal bed mobility: Needs Assistance Bed Mobility: Sidelying to Sit, Sit to Sidelying, Rolling Rolling: Supervision Sidelying to sit: Supervision     Sit to sidelying: Min guard      Transfers Overall transfer level: Needs  assistance Equipment used: Rolling walker (2 wheels) Transfers: Sit to/from Stand, Bed to chair/wheelchair/BSC Sit to Stand: Supervision, From elevated surface   Step pivot transfers: Min assist       General transfer comment: cueing for UE use,cues for spinal neutral awareness    Ambulation/Gait Ambulation/Gait assistance: Supervision Gait Distance (Feet): 350 Feet Assistive device: Rolling walker (2 wheels)   Gait velocity: decreased     General Gait Details: Pt performing well with reduced reliance on the walker during ambulation and has stable gait pattern as well.  Slight out-toeing during gait, but pt and wife note that this is normal.   Chief Strategy Officer    Modified Rankin (Stroke Patients Only)       Balance Overall balance assessment: Needs assistance Sitting-balance support: No upper extremity supported Sitting balance-Leahy Scale: Good     Standing balance support: Bilateral upper extremity supported Standing balance-Leahy Scale: Fair                              Cognition Arousal/Alertness: Awake/alert Behavior During Therapy: WFL for tasks assessed/performed Overall Cognitive Status: Within Functional Limits for tasks assessed                                          Exercises      General Comments        Pertinent Vitals/Pain Pain Assessment Pain Assessment: Faces Faces Pain Scale: Hurts a little bit Pain Location: pt reports minimal pain  upon arrival to room today. Pain Descriptors / Indicators: Aching Pain Intervention(s): Limited activity within patient's tolerance, Monitored during session    Home Living                          Prior Function            PT Goals (current goals can now be found in the care plan section) Progress towards PT goals: Progressing toward goals    Frequency    7X/week      PT Plan Current plan remains appropriate     Co-evaluation              AM-PAC PT "6 Clicks" Mobility   Outcome Measure  Help needed turning from your back to your side while in a flat bed without using bedrails?: A Little Help needed moving from lying on your back to sitting on the side of a flat bed without using bedrails?: A Little Help needed moving to and from a bed to a chair (including a wheelchair)?: A Little Help needed standing up from a chair using your arms (e.g., wheelchair or bedside chair)?: A Little Help needed to walk in hospital room?: A Little Help needed climbing 3-5 steps with a railing? : A Little 6 Click Score: 18    End of Session Equipment Utilized During Treatment: Gait belt Activity Tolerance: Patient tolerated treatment well Patient left: in chair;with call bell/phone within reach;with family/visitor present Nurse Communication: Mobility status PT Visit Diagnosis: Muscle weakness (generalized) (M62.81);Difficulty in walking, not elsewhere classified (R26.2);Pain     Time: 1540-1555 PT Time Calculation (min) (ACUTE ONLY): 15 min  Charges:  $Therapeutic Exercise: 8-22 mins                     Gwenlyn Saran, PT, DPT 10/29/21, 4:39 PM    Christie Nottingham 10/29/2021, 4:37 PM

## 2021-10-29 NOTE — Plan of Care (Signed)

## 2021-10-29 NOTE — Plan of Care (Signed)
  Problem: Education: Goal: Knowledge of General Education information will improve Description: Including pain rating scale, medication(s)/side effects and non-pharmacologic comfort measures Outcome: Progressing   Problem: Health Behavior/Discharge Planning: Goal: Ability to manage health-related needs will improve Outcome: Progressing   Problem: Clinical Measurements: Goal: Ability to maintain clinical measurements within normal limits will improve Outcome: Progressing Goal: Will remain free from infection Outcome: Progressing Goal: Cardiovascular complication will be avoided Outcome: Progressing   Problem: Activity: Goal: Risk for activity intolerance will decrease Outcome: Progressing   Problem: Elimination: Goal: Will not experience complications related to bowel motility Outcome: Progressing   Problem: Pain Managment: Goal: General experience of comfort will improve Outcome: Not Progressing

## 2021-10-30 ENCOUNTER — Other Ambulatory Visit: Payer: Self-pay | Admitting: Internal Medicine

## 2021-10-30 LAB — GLUCOSE, CAPILLARY: Glucose-Capillary: 252 mg/dL — ABNORMAL HIGH (ref 70–99)

## 2021-10-30 MED ORDER — ONDANSETRON HCL 4 MG PO TABS: 4.0000 mg | ORAL_TABLET | Freq: Four times a day (QID) | ORAL | 0 refills | Status: DC | PRN

## 2021-10-30 MED ORDER — PROCHLORPERAZINE MALEATE 5 MG PO TABS: 5.0000 mg | ORAL_TABLET | Freq: Four times a day (QID) | ORAL | 0 refills | Status: DC | PRN

## 2021-10-30 MED ORDER — METHOCARBAMOL 500 MG PO TABS: 500.0000 mg | ORAL_TABLET | Freq: Four times a day (QID) | ORAL | 0 refills | Status: DC | PRN

## 2021-10-30 MED ORDER — SENNA 8.6 MG PO TABS: 1.0000 | ORAL_TABLET | Freq: Two times a day (BID) | ORAL | 0 refills | Status: DC | PRN

## 2021-10-30 MED ORDER — BACLOFEN 10 MG PO TABS: 10.0000 mg | ORAL_TABLET | Freq: Three times a day (TID) | ORAL | 1 refills | Status: AC | PRN

## 2021-10-30 MED ORDER — OXYCODONE HCL 5 MG PO TABS: 5.0000 mg | ORAL_TABLET | ORAL | 0 refills | Status: AC | PRN

## 2021-10-30 NOTE — Progress Notes (Signed)
    Attending Progress Note  History: Dean Simpson is here for L4-S1 TLIF   POD4: Improved pain since yesterday. Pt eager to d/c home today  POD3: Difficult night last night in regards to back pain. Improved this morning   POD2: pt had good night with minimal need for pain medication  POD1: Some lightheadedness and ambulating to the bedside chair this morning but reports well-controlled back pain.  Physical Exam: Vitals:   10/30/21 0527 10/30/21 0730  BP: (!) 135/54   Pulse: 86   Resp: 16   Temp: 98.3 F (36.8 C)   SpO2: 96% 95%    AA Ox3 CNI  Strength:5/5 throughout BLE  Incisional wound vac in place  Data:  No results for input(s): "NA", "K", "CL", "CO2", "BUN", "CREATININE", "LABGLOM", "GLUCOSE", "CALCIUM" in the last 168 hours. No results for input(s): "AST", "ALT", "ALKPHOS" in the last 168 hours.  Invalid input(s): "TBILI"   Recent Labs  Lab 10/27/21 0531  WBC 11.5*  HGB 11.2*  HCT 34.2*  PLT 238    No results for input(s): "APTT", "INR" in the last 168 hours.       Other tests/results: none   Assessment/Plan:  Dean Simpson a 71 y.o s/p L4-S1 TLIF on 10/26/21  - mobilize - pain control - DVT prophylaxis - PTOT; dispo planning underway. Pt established with [facility] pre-op - removed wound vac and placed clean bandage - HV removed 7/12  Cooper Render PA-C Department of Neurosurgery

## 2021-10-30 NOTE — Progress Notes (Signed)
Zofran and Robaxin given at d/c required prior auth. Opted to send in prescriptions for Compazine and Baclofen as alternatives.

## 2021-10-30 NOTE — Care Management Important Message (Signed)
Important Message  Patient Details  Name: Dean Simpson MRN: [id number] Date of Birth: 1951/04/13   Medicare Important Message Given:  N/A - LOS <3 / Initial given by admissions     [name] 10/30/2021, 11:18 AM

## 2021-10-30 NOTE — Discharge Summary (Signed)
Physician Discharge Summary  Patient ID: Dean Simpson MRN: [id number] DOB/AGE: 23-Sep-1950 71 y.o.  Admit date: 10/26/2021 Discharge date: 10/30/2021  Admission Diagnoses:  Anterolisthesis M43.10, Lumbar radiculopathy M54.16  Discharge Diagnoses:  Principal Problem:   S/P lumbar fusion   Discharged Condition: good  Hospital Course:  Diron Kretzschmar is a pleasant 71 y.o s/p L4-S1 TLIF. His intraoperative course was largely uncomplicated. He was admitted for drain output monitoring, pain control, and therapy.  He did well postoperatively with the exception of some increased pain on postop day 3 and associated nausea and vomiting.  This resolved with medication changes.  The patient was seen by therapy and deemed appropriate for discharge home with home health.  He was discharged home on postop day 4 with medications for pain, muscle relaxant, nausea, and stool softener.  Consults: None  Significant Diagnostic Studies: none   Treatments: surgery: as above. Please see separately dictated operative report for further details.   Discharge Exam: Blood pressure (!) 135/54, pulse 86, temperature 98.3 F (36.8 C), resp. rate 16, height '5\' 7"'$  (1.702 m), weight 106.6 kg, SpO2 95 %. AA Ox3 CNI Strength:5/5 throughout BLE  Incision c/d/I with clean bandage in place  Disposition:   Discharge Instructions     Incentive spirometry RT   Complete by: As directed       Allergies as of 10/30/2021       Reactions   Iodine    shellfish   Shellfish Allergy Itching, Swelling        Medication List     STOP taking these medications    meloxicam 15 MG tablet Commonly known as: MOBIC       TAKE these medications    Advair HFA 115-21 MCG/ACT inhaler Generic drug: fluticasone-salmeterol Inhale 2 puffs into the lungs in the morning and at bedtime.   ALBUTEROL-IPRATROPIUM IN Inhale 3 mLs into the lungs 2 (two) times daily.   amLODipine 10 MG tablet Commonly  known as: NORVASC Take 10 mg by mouth every morning.   aspirin EC 81 MG tablet Take 81 mg by mouth daily.   atorvastatin 80 MG tablet Commonly known as: LIPITOR Take 40 mg by mouth at bedtime.   canagliflozin 300 MG Tabs tablet Commonly known as: INVOKANA Take 300 mg by mouth daily before breakfast.   cloNIDine 0.1 MG tablet Commonly known as: CATAPRES Take 0.1 mg by mouth 2 (two) times daily.   Daliresp 250 MCG Tabs Generic drug: Roflumilast Take 1 tablet by mouth daily at 6 (six) AM.   doxazosin 4 MG tablet Commonly known as: CARDURA Take 4 mg by mouth at bedtime.   fluocinonide-emollient 0.05 % cream Commonly known as: LIDEX-E Apply 1 application topically 2 (two) times daily.   gabapentin 300 MG capsule Commonly known as: NEURONTIN Take 300 mg by mouth at bedtime.   glimepiride 4 MG tablet Commonly known as: AMARYL Take 4 mg by mouth daily with breakfast.   hydrOXYzine 25 MG tablet Commonly known as: ATARAX Take 25 mg by mouth 3 (three) times daily as needed for itching.   metFORMIN 500 MG tablet Commonly known as: GLUCOPHAGE Take 1,000 mg by mouth 2 (two) times daily with a meal.   methocarbamol 500 MG tablet Commonly known as: ROBAXIN Take 1 tablet (500 mg total) by mouth every 6 (six) hours as needed for muscle spasms.   metoprolol succinate 100 MG 24 hr tablet Commonly known as: TOPROL-XL Take 100 mg by mouth every morning. Take with or immediately  following a meal.   nystatin ointment Commonly known as: MYCOSTATIN Apply 1 application topically as needed.   ondansetron 4 MG tablet Commonly known as: ZOFRAN Take 1 tablet (4 mg total) by mouth every 6 (six) hours as needed for nausea or vomiting.   oxyCODONE 5 MG immediate release tablet Commonly known as: Oxy IR/ROXICODONE Take 1-2 tablets (5-10 mg total) by mouth every 4 (four) hours as needed for up to 5 days for moderate pain or severe pain ((score 4 to 6)).   Semaglutide (2 MG/DOSE) 8  MG/3ML Sopn Inject 1 Dose into the skin once a week. On Sundays   senna 8.6 MG Tabs tablet Commonly known as: SENOKOT Take 1 tablet (8.6 mg total) by mouth 2 (two) times daily as needed for mild constipation.   triamcinolone cream 0.5 % Commonly known as: KENALOG Apply 1 Application topically as needed.   valsartan-hydrochlorothiazide 320-25 MG tablet Commonly known as: DIOVAN-HCT Take 1 tablet by mouth every morning.        Follow-up Information     Loleta Dicker, PA Follow up in 2 week(s).   Specialty: Neurosurgery Contact information: [address] [place] [address] [phone]                 Signed: Loleta Dicker 10/30/2021, 8:47 AM

## 2021-10-30 NOTE — Plan of Care (Signed)

## 2021-11-05 ENCOUNTER — Encounter: Payer: Self-pay | Admitting: Neurosurgery

## 2021-11-10 ENCOUNTER — Ambulatory Visit (INDEPENDENT_AMBULATORY_CARE_PROVIDER_SITE_OTHER): Payer: Medicare Other | Admitting: Neurosurgery

## 2021-11-10 ENCOUNTER — Inpatient Hospital Stay
Admission: EM | Admit: 2021-11-10 | Discharge: 2021-11-12 | DRG: 305 | Disposition: A | Discharge status: Home / Self Care | Payer: Medicare Other | Attending: Internal Medicine | Admitting: Internal Medicine

## 2021-11-10 ENCOUNTER — Emergency Department: Payer: Medicare Other

## 2021-11-10 ENCOUNTER — Encounter: Payer: Self-pay | Admitting: Neurosurgery

## 2021-11-10 VITALS — BP 115/62 | HR 80 | Temp 98.7°F

## 2021-11-10 DIAGNOSIS — R0602 Shortness of breath: Secondary | ICD-10-CM | POA: Diagnosis not present

## 2021-11-10 DIAGNOSIS — M4310 Spondylolisthesis, site unspecified: Secondary | ICD-10-CM

## 2021-11-10 DIAGNOSIS — R42 Dizziness and giddiness: Secondary | ICD-10-CM

## 2021-11-10 DIAGNOSIS — Z79899 Other long term (current) drug therapy: Secondary | ICD-10-CM

## 2021-11-10 DIAGNOSIS — I10 Essential (primary) hypertension: Secondary | ICD-10-CM | POA: Diagnosis present

## 2021-11-10 DIAGNOSIS — E669 Obesity, unspecified: Secondary | ICD-10-CM

## 2021-11-10 DIAGNOSIS — Z7982 Long term (current) use of aspirin: Secondary | ICD-10-CM

## 2021-11-10 DIAGNOSIS — Z6836 Body mass index (BMI) 36.0-36.9, adult: Secondary | ICD-10-CM

## 2021-11-10 DIAGNOSIS — Z91013 Allergy to seafood: Secondary | ICD-10-CM

## 2021-11-10 DIAGNOSIS — G4733 Obstructive sleep apnea (adult) (pediatric): Secondary | ICD-10-CM | POA: Diagnosis present

## 2021-11-10 DIAGNOSIS — M5416 Radiculopathy, lumbar region: Secondary | ICD-10-CM

## 2021-11-10 DIAGNOSIS — I472 Ventricular tachycardia, unspecified: Secondary | ICD-10-CM | POA: Diagnosis present

## 2021-11-10 DIAGNOSIS — Z8249 Family history of ischemic heart disease and other diseases of the circulatory system: Secondary | ICD-10-CM

## 2021-11-10 DIAGNOSIS — Z806 Family history of leukemia: Secondary | ICD-10-CM

## 2021-11-10 DIAGNOSIS — J439 Emphysema, unspecified: Secondary | ICD-10-CM | POA: Diagnosis present

## 2021-11-10 DIAGNOSIS — Z981 Arthrodesis status: Secondary | ICD-10-CM

## 2021-11-10 DIAGNOSIS — I160 Hypertensive urgency: Secondary | ICD-10-CM | POA: Diagnosis not present

## 2021-11-10 DIAGNOSIS — J449 Chronic obstructive pulmonary disease, unspecified: Secondary | ICD-10-CM

## 2021-11-10 DIAGNOSIS — I493 Ventricular premature depolarization: Secondary | ICD-10-CM | POA: Diagnosis present

## 2021-11-10 DIAGNOSIS — Z888 Allergy status to other drugs, medicaments and biological substances status: Secondary | ICD-10-CM

## 2021-11-10 DIAGNOSIS — N400 Enlarged prostate without lower urinary tract symptoms: Secondary | ICD-10-CM | POA: Diagnosis present

## 2021-11-10 DIAGNOSIS — Z7984 Long term (current) use of oral hypoglycemic drugs: Secondary | ICD-10-CM

## 2021-11-10 DIAGNOSIS — Z09 Encounter for follow-up examination after completed treatment for conditions other than malignant neoplasm: Secondary | ICD-10-CM

## 2021-11-10 DIAGNOSIS — M1990 Unspecified osteoarthritis, unspecified site: Secondary | ICD-10-CM | POA: Diagnosis present

## 2021-11-10 DIAGNOSIS — Z800 Family history of malignant neoplasm of digestive organs: Secondary | ICD-10-CM

## 2021-11-10 DIAGNOSIS — E119 Type 2 diabetes mellitus without complications: Secondary | ICD-10-CM

## 2021-11-10 DIAGNOSIS — E785 Hyperlipidemia, unspecified: Secondary | ICD-10-CM | POA: Diagnosis present

## 2021-11-10 LAB — COMPREHENSIVE METABOLIC PANEL
ALT: 28 U/L (ref 0–44)
AST: 18 U/L (ref 15–41)
Albumin: 3.6 g/dL (ref 3.5–5.0)
Alkaline Phosphatase: 89 U/L (ref 38–126)
Anion gap: 12 (ref 5–15)
BUN: 22 mg/dL (ref 8–23)
CO2: 23 mmol/L (ref 22–32)
Calcium: 9.2 mg/dL (ref 8.9–10.3)
Chloride: 99 mmol/L (ref 98–111)
Creatinine, Ser: 0.84 mg/dL (ref 0.61–1.24)
GFR, Estimated: >60 mL/min (ref >60)
Glucose, Bld: 170 mg/dL — ABNORMAL HIGH (ref 70–99)
Potassium: 3.6 mmol/L (ref 3.5–5.1)
Sodium: 134 mmol/L — ABNORMAL LOW (ref 135–145)
Total Bilirubin: 0.8 mg/dL (ref 0.3–1.2)
Total Protein: 7.3 g/dL (ref 6.5–8.1)

## 2021-11-10 LAB — CBC WITH DIFFERENTIAL/PLATELET
Abs Immature Granulocytes: 0.05 10*3/uL (ref 0.00–0.07)
Basophils Absolute: 0 10*3/uL (ref 0.0–0.1)
Basophils Relative: 0 %
Eosinophils Absolute: 0.1 10*3/uL (ref 0.0–0.5)
Eosinophils Relative: 1 %
HCT: 36.3 % — ABNORMAL LOW (ref 39.0–52.0)
Hemoglobin: 11.5 g/dL — ABNORMAL LOW (ref 13.0–17.0)
Immature Granulocytes: 1 %
Lymphocytes Relative: 15 %
Lymphs Abs: 1.6 10*3/uL (ref 0.7–4.0)
MCH: 28 pg (ref 26.0–34.0)
MCHC: 31.7 g/dL (ref 30.0–36.0)
MCV: 88.5 fL (ref 80.0–100.0)
Monocytes Absolute: 0.7 10*3/uL (ref 0.1–1.0)
Monocytes Relative: 6 %
Neutro Abs: 8.3 10*3/uL — ABNORMAL HIGH (ref 1.7–7.7)
Neutrophils Relative %: 77 %
Platelets: 555 10*3/uL — ABNORMAL HIGH (ref 150–400)
RBC: 4.1 MIL/uL — ABNORMAL LOW (ref 4.22–5.81)
RDW: 12.9 % (ref 11.5–15.5)
WBC: 10.7 10*3/uL — ABNORMAL HIGH (ref 4.0–10.5)
nRBC: 0 % (ref 0.0–0.2)

## 2021-11-10 LAB — TROPONIN I (HIGH SENSITIVITY)
Troponin I (High Sensitivity): 10 ng/L (ref <18)
Troponin I (High Sensitivity): 11 ng/L (ref <18)

## 2021-11-10 LAB — MAGNESIUM: Magnesium: 1.9 mg/dL (ref 1.7–2.4)

## 2021-11-10 LAB — D-DIMER, QUANTITATIVE: D-Dimer, Quant: 1.9 ug/mL-FEU — ABNORMAL HIGH (ref 0.00–0.50)

## 2021-11-10 MED ORDER — OXYCODONE HCL 5 MG PO TABS: 5.0000 mg | ORAL_TABLET | Freq: Three times a day (TID) | ORAL | 0 refills | Status: AC | PRN

## 2021-11-10 MED ORDER — IOHEXOL 350 MG/ML SOLN
100.0000 mL | Freq: Once | INTRAVENOUS | Status: AC | PRN
  Administered 2021-11-10: 100 mL via INTRAVENOUS

## 2021-11-10 MED ORDER — MAGNESIUM OXIDE -MG SUPPLEMENT 400 (240 MG) MG PO TABS
400.0000 mg | ORAL_TABLET | Freq: Once | ORAL | Status: AC
  Administered 2021-11-11: 400 mg via ORAL
  Filled 2021-11-10: qty 1

## 2021-11-10 MED ORDER — POTASSIUM CHLORIDE CRYS ER 20 MEQ PO TBCR
40.0000 meq | EXTENDED_RELEASE_TABLET | Freq: Once | ORAL | Status: AC
  Administered 2021-11-11: 40 meq via ORAL
  Filled 2021-11-10: qty 2

## 2021-11-10 NOTE — Assessment & Plan Note (Signed)
-  Continue Cardura 

## 2021-11-10 NOTE — Assessment & Plan Note (Signed)
Complicating factor to overall prognosis and care 

## 2021-11-10 NOTE — Assessment & Plan Note (Signed)
Could be multifactorial and related to her PVCs, elevated blood pressure, Pain meds with OSA Continuous cardiac monitoring overnight Blood pressure control Orthostatic vitals Echo Cardiology consult

## 2021-11-10 NOTE — ED Provider Notes (Signed)
[facility] Provider Note    Event Date/Time   First MD Initiated Contact with Patient 11/10/21 2007     (approximate)   History   Dizziness and Palpitations (C/o dizziness that began approx 1500 today, no LOC, with shortness of breath, uncontrollable BP and HR)   HPI  Dean Simpson is a 71 y.o. male past medical history of COPD diabetes BPH recent L4-S1 fusion presents with dizziness.  The last several mornings patient has felt dizzy.  Today when he checked his blood pressure was elevated in the 170s.  He pretty much felt dizzy all morning and this occurred while he was at rest and worse when he was moving around.  Patient has difficulty describing the nature of the dizziness denies vertigo and denies presyncope or syncope.  Cannot really provide many qualifiers to exactly how he is feeling he denies chest pain does feel mildly short of breath.  Apparently he has oxygen at home that he uses as needed for shortness of breath and has not been using it lately.  He is on muscle relaxant and oxycodone for pain after a recent lumbar fusion.  Denies fevers chills cough.  Per EMS patient had bigeminy multiple PVCs and a run of 3 PVCs consistent with V. tach.  Did not receive any medications.  Patient has no cardiac history.     Past Medical History:  Diagnosis Date   Arthritis    BPH (benign prostatic hyperplasia)    COPD (chronic obstructive pulmonary disease) (HCC)    Diabetes mellitus without complication (HCC)    Dyspnea    with exertion   Hemorrhoids    History of tobacco abuse    Hx of colonic polyps    Hyperlipidemia    Hypertension    Microscopic hematuria    Obstructive emphysema (HCC)    Proteinuria 02/25/2014   Psoriasis    Right ovarian cyst    Sleep apnea    uses Cpap    Patient Active Problem List   Diagnosis Date Noted   S/P lumbar fusion 10/26/2021     Physical Exam  Triage Vital Signs: ED Triage Vitals  Enc Vitals  Group     BP      Pulse      Resp      Temp      Temp src      SpO2      Weight      Height      Head Circumference      Peak Flow      Pain Score      Pain Loc      Pain Edu?      Excl. in [place]?     Most recent vital signs: Vitals:   11/10/21 2010  BP: (!) 163/92  Pulse: 86  Resp: 14  Temp: 98.5 F (36.9 C)  SpO2: 95%     General: Awake, no distress.  CV:  Good peripheral perfusion.  No peripheral edema Resp:  Normal effort.  Lungs are clear Abd:  No distention.  Abdomen is soft and nontender Neuro:             Awake, Alert, Oriented x 3  Other:     ED Results / Procedures / Treatments  Labs (all labs ordered are listed, but only abnormal results are displayed) Labs Reviewed  COMPREHENSIVE METABOLIC PANEL - Abnormal; Notable for the following components:      Result Value  Sodium 134 (*)    Glucose, Bld 170 (*)    All other components within normal limits  CBC WITH DIFFERENTIAL/PLATELET - Abnormal; Notable for the following components:   WBC 10.7 (*)    RBC 4.10 (*)    Hemoglobin 11.5 (*)    HCT 36.3 (*)    Platelets 555 (*)    Neutro Abs 8.3 (*)    All other components within normal limits  D-DIMER, QUANTITATIVE - Abnormal; Notable for the following components:   D-Dimer, Quant 1.90 (*)    All other components within normal limits  MAGNESIUM  TROPONIN I (HIGH SENSITIVITY)  TROPONIN I (HIGH SENSITIVITY)     EKG  EKG interpreted by myself shows normal sinus rhythm with normal axis normal intervals PVCs no acute ischemic changes   RADIOLOGY I reviewed and interpreted the CXR which does not show any acute cardiopulmonary process    PROCEDURES:  Critical Care performed: No  .1-3 Lead EKG Interpretation  Performed by: Rada Hay, MD Authorized by: Rada Hay, MD     Interpretation: abnormal     ECG rate assessment: normal     Rhythm: sinus rhythm     Ectopy: PVCs     Conduction: normal     The patient is on the cardiac  monitor to evaluate for evidence of arrhythmia and/or significant heart rate changes.   MEDICATIONS ORDERED IN ED: Medications  iohexol (OMNIPAQUE) 350 MG/ML injection 100 mL (100 mLs Intravenous Contrast Given 11/10/21 2249)     IMPRESSION / MDM / ASSESSMENT AND PLAN / ED COURSE  I reviewed the triage vital signs and the nursing notes.                              Patient's presentation is most consistent with acute presentation with potential threat to life or bodily function.  Differential diagnosis includes, but is not limited to, cardiac arrhythmia, ventricular tachycardia, bigeminy, multifocal PVCs, electrolyte abnormality, cardiac ischemia  Patient is a 71 year old male who presents with chief complaint of dizziness.  He felt abnormal dizzy not presyncopal or vertiginous checked his blood pressure was elevated.  Denies chest pain or shortness of breath currently but did feel short of breath earlier.  Per EMS patient was having multiple PVCs bigeminy and did have 1 run of V. tach that consisted of just 3 PVCs.  Patient's blood pressure is normal heart rate is in the 90s I do see some PVCs on the monitor.  Plan to obtain electrolytes troponin CBC and continue to monitor the patient.  Ultimately with him being symptomatic anticipate admission for telemetry monitoring.  Patient is complaining of some mild dyspnea.  Given he is recently postop I checked a D-dimer which was positive so obtained a CTA does not have any evidence of PE.  Reviewed patient's telemetry does have multiple episodes of trigeminy and PVCs.  Given he is symptomatic will admit for telemetry monitoring.     FINAL CLINICAL IMPRESSION(S) / ED DIAGNOSES   Final diagnoses:  Dizziness     Rx / DC Orders   ED Discharge Orders     None        Note:  This document was prepared using Dragon voice recognition software and may include unintentional dictation errors.   Rada Hay, MD 11/10/21 [phone]

## 2021-11-10 NOTE — H&P (Signed)
History and Physical    Patient: Dean Simpson GMW:[id number] DOB: 07-09-1950 DOA: 11/10/2021 DOS: the patient was seen and examined on 11/10/2021 PCP: Adin Hector, MD  Patient coming from: Home  Chief Complaint:  Chief Complaint  Patient presents with   Dizziness   Palpitations    C/o dizziness that began approx 1500 today, no LOC, with shortness of breath, uncontrollable BP and HR    HPI: Dean Simpson is a 71 y.o. male with medical history significant for DM, HTN, BPH, obesity, COPD, OSA on CPAP with O2 bleed-in and as needed oxygen, s/p lumbar fusion on 10/26/2021 with uneventful postoperative course who presents to the ED for evaluation of dizziness and shortness of breath.  He also noted that his blood pressure has been running high in the 170s all day.  Patient denies chest pain or palpitations.  Denies fever or chills.  His back pain is controlled with oxycodone and a muscle relaxant.  EMS reports seeing bigeminy and trigeminy and multiple PVCs. ED course and data review: BP 163/92 with otherwise normal vitals Labs: WBC 10,700 and hemoglobin 11.5, platelets 5 55,000.  Glucose 170.  Potassium 3.6 and magnesium 1.9.  Troponin 11.  D-dimer 1.9. EKG, personally viewed and interpreted: Sinus at 85 with PVCs and no acute ST-T wave changes CTA chest negative for PE, details as follows: IMPRESSION: No evidence of pulmonary embolism.   15 mm hyperdense lesion in the right upper kidney, previously 12 mm, indeterminate. Given the small size, consider follow-up MRI abdomen with/without contrast in 3-6 months.  Admission to observation requested     Past Medical History:  Diagnosis Date   Arthritis    BPH (benign prostatic hyperplasia)    COPD (chronic obstructive pulmonary disease) (HCC)    Diabetes mellitus without complication (HCC)    Dyspnea    with exertion   Hemorrhoids    History of tobacco abuse    Hx of colonic polyps     Hyperlipidemia    Hypertension    Microscopic hematuria    Obstructive emphysema (HCC)    Proteinuria 02/25/2014   Psoriasis    Right ovarian cyst    Sleep apnea    uses Cpap   Past Surgical History:  Procedure Laterality Date   APPLICATION OF INTRAOPERATIVE CT SCAN N/A 10/26/2021   Procedure: APPLICATION OF INTRAOPERATIVE CT SCAN;  Surgeon: Meade Maw, MD;  Location: [facility] ORS;  Service: Neurosurgery;  Laterality: N/A;   COLONOSCOPY WITH PROPOFOL N/A 09/23/2014   Procedure: COLONOSCOPY WITH PROPOFOL;  Surgeon: Hulen Luster, MD;  Location[facility] ENDOSCOPY;  Service: Gastroenterology;  Laterality: N/A;   COLONOSCOPY WITH PROPOFOL N/A 03/27/2018   Procedure: COLONOSCOPY WITH PROPOFOL;  Surgeon: Manya Silvas, MD;  Location: [facility] ENDOSCOPY;  Service: Endoscopy;  Laterality: N/A;   TOOTH EXTRACTION     TRANSFORAMINAL LUMBAR INTERBODY FUSION (TLIF) WITH PEDICLE SCREW FIXATION 2 LEVEL N/A 10/26/2021   Procedure: OPEN L4-S1 TRANSFORAMINAL LUMBAR INTERBODY FUSION (TLIF);  Surgeon: Meade Maw, MD;  Location: [facility] ORS;  Service: Neurosurgery;  Laterality: N/A;   Social History:  reports that he quit smoking about 13 years ago. His smoking use included cigarettes. He has a 82.00 pack-year smoking history. He has never used smokeless tobacco. He reports that he does not drink alcohol and does not use drugs.  Allergies  Allergen Reactions   Iodine Swelling    shellfish shellfish   Shellfish Allergy Itching and Swelling    NOT AT EVERY EXPOSURE  Family History  Problem Relation Age of Onset   Pancreatic cancer Father    Hypertension Father    Leukemia Son     Prior to Admission medications   Medication Sig Start Date End Date Taking? Authorizing Provider  ADVAIR HFA 115-21 MCG/ACT inhaler Inhale 2 puffs into the lungs in the morning and at bedtime. 07/10/21   [provider]  amLODipine (NORVASC) 10 MG tablet Take 10 mg by mouth every morning.    [provider]  aspirin EC 81 MG tablet Take 81 mg by mouth daily.    [provider]  atorvastatin (LIPITOR) 80 MG tablet Take 40 mg by mouth at bedtime.    [provider]  baclofen (LIORESAL) 10 MG tablet Take 1 tablet (10 mg total) by mouth 3 (three) times daily as needed for muscle spasms. 10/30/21 11/29/21  Loleta Dicker, PA  canagliflozin (INVOKANA) 300 MG TABS tablet Take 300 mg by mouth daily before breakfast.    [provider]  cloNIDine (CATAPRES) 0.1 MG tablet Take 0.1 mg by mouth 2 (two) times daily.    [provider]  doxazosin (CARDURA) 4 MG tablet Take 4 mg by mouth at bedtime.    [provider]  fluocinonide-emollient (LIDEX-E) 0.05 % cream Apply 1 application topically 2 (two) times daily.    [provider]  gabapentin (NEURONTIN) 300 MG capsule Take 300 mg by mouth at bedtime.    [provider]  glimepiride (AMARYL) 4 MG tablet Take 4 mg by mouth daily with breakfast.    [provider]  hydrOXYzine (ATARAX/VISTARIL) 25 MG tablet Take 25 mg by mouth 3 (three) times daily as needed for itching.    [provider]  Ipratropium-Albuterol (ALBUTEROL-IPRATROPIUM IN) Inhale 3 mLs into the lungs 2 (two) times daily.    [provider]  metFORMIN (GLUCOPHAGE) 500 MG tablet Take 1,000 mg by mouth 2 (two) times daily with a meal.    [provider]  metoprolol succinate (TOPROL-XL) 100 MG 24 hr tablet Take 100 mg by mouth every morning. Take with or immediately following a meal.    [provider]  nystatin ointment (MYCOSTATIN) Apply 1 application topically as needed.    [provider]  ondansetron (ZOFRAN) 4 MG tablet Take 1 tablet (4 mg total) by mouth every 6 (six) hours as needed for nausea or vomiting. 10/30/21   Loleta Dicker, PA  oxyCODONE (ROXICODONE) 5 MG immediate release tablet Take 1 tablet (5 mg total) by mouth 3 (three) times daily as needed for up to 5 days  for severe pain. 11/10/21 11/15/21  Loleta Dicker, PA  prochlorperazine (COMPAZINE) 5 MG tablet Take 1 tablet (5 mg total) by mouth every 6 (six) hours as needed for nausea or vomiting. 10/30/21   Loleta Dicker, PA  Roflumilast (DALIRESP) 250 MCG TABS Take 1 tablet by mouth daily at 6 (six) AM.    [provider]  Semaglutide, 2 MG/DOSE, 8 MG/3ML SOPN Inject 1 Dose into the skin once a week. On Sundays    [provider]  senna (SENOKOT) 8.6 MG TABS tablet Take 1 tablet (8.6 mg total) by mouth 2 (two) times daily as needed for mild constipation. 10/30/21   Loleta Dicker, PA  triamcinolone cream (KENALOG) 0.5 % Apply 1 Application topically as needed.    [provider]  valsartan-hydrochlorothiazide (DIOVAN-HCT) 320-25 MG tablet Take 1 tablet by mouth every morning.    [provider]    Physical Exam: Vitals:   11/10/21 2010 11/10/21 2011 11/10/21 2300  BP: (!) 163/92  (!) 175/57  Pulse: 86  83  Resp: 14  12  Temp: 98.5 F (36.9 C)    TempSrc: Oral    SpO2: 95%  95%  Weight:  106.6 kg   Height:  '5\' 7"'$  (1.702 m)    Physical Exam Vitals and nursing note reviewed.  Constitutional:      General: He is not in acute distress. HENT:     Head: Normocephalic and atraumatic.  Cardiovascular:     Rate and Rhythm: Normal rate and regular rhythm.     Heart sounds: Normal heart sounds.  Pulmonary:     Effort: Pulmonary effort is normal.     Breath sounds: Normal breath sounds.  Abdominal:     Palpations: Abdomen is soft.     Tenderness: There is no abdominal tenderness.  Neurological:     Mental Status: Mental status is at baseline.     Labs on Admission: I have personally reviewed following labs and imaging studies  CBC: Recent Labs  Lab 11/10/21 2016  WBC 10.7*  NEUTROABS 8.3*  HGB 11.5*  HCT 36.3*  MCV 88.5  PLT 622*   Basic Metabolic Panel: Recent Labs  Lab 11/10/21 2016  NA 134*  K 3.6  CL 99  CO2 23  GLUCOSE 170*   BUN 22  CREATININE 0.84  CALCIUM 9.2  MG 1.9   GFR: Estimated Creatinine Clearance: 98 mL/min (by C-G formula based on SCr of 0.84 mg/dL). Liver Function Tests: Recent Labs  Lab 11/10/21 2016  AST 18  ALT 28  ALKPHOS 89  BILITOT 0.8  PROT 7.3  ALBUMIN 3.6   No results for input(s): "LIPASE", "AMYLASE" in the last 168 hours. No results for input(s): "AMMONIA" in the last 168 hours. Coagulation Profile: No results for input(s): "INR", "PROTIME" in the last 168 hours. Cardiac Enzymes: No results for input(s): "CKTOTAL", "CKMB", "CKMBINDEX", "TROPONINI" in the last 168 hours. BNP (last 3 results) No results for input(s): "PROBNP" in the last 8760 hours. HbA1C: No results for input(s): "HGBA1C" in the last 72 hours. CBG: No results for input(s): "GLUCAP" in the last 168 hours. Lipid Profile: No results for input(s): "CHOL", "HDL", "LDLCALC", "TRIG", "CHOLHDL", "LDLDIRECT" in the last 72 hours. Thyroid Function Tests: No results for input(s): "TSH", "T4TOTAL", "FREET4", "T3FREE", "THYROIDAB" in the last 72 hours. Anemia Panel: No results for input(s): "VITAMINB12", "FOLATE", "FERRITIN", "TIBC", "IRON", "RETICCTPCT" in the last 72 hours. Urine analysis:    Component Value Date/Time   COLORURINE YELLOW (A) 10/15/2021 1104   APPEARANCEUR CLEAR (A) 10/15/2021 1104   LABSPEC 1.021 10/15/2021 1104   PHURINE 5.0 10/15/2021 1104   GLUCOSEU >=500 (A) 10/15/2021 1104   HGBUR NEGATIVE 10/15/2021 1104   BILIRUBINUR NEGATIVE 10/15/2021 1104   KETONESUR NEGATIVE 10/15/2021 1104   PROTEINUR NEGATIVE 10/15/2021 1104   NITRITE NEGATIVE 10/15/2021 [place] 10/15/2021 1104    Radiological Exams on Admission: CT Angio Chest PE W and/or Wo Contrast  Result Date: 11/10/2021 CLINICAL DATA:  Shortness of breath, positive D-dimer, recent lumbar fusion surgery EXAM: CT ANGIOGRAPHY CHEST WITH CONTRAST TECHNIQUE: Multidetector CT imaging of the chest was performed using the  standard protocol during bolus administration of intravenous contrast. Multiplanar CT image reconstructions and MIPs were obtained to evaluate the vascular anatomy. RADIATION DOSE REDUCTION: This exam was performed according to the departmental dose-optimization program which includes automated exposure control,  adjustment of the mA and/or kV according to patient size and/or use of iterative reconstruction technique. CONTRAST:  157m OMNIPAQUE IOHEXOL 350 MG/ML SOLN COMPARISON:  Chest radiograph dated 11/10/2021. CT chest dated 09/18/2018. FINDINGS: Cardiovascular: Satisfactory opacification the bilateral pulmonary arteries to the segmental level. No evidence of pulmonary embolism. Although not tailored for evaluation of the thoracic aorta, there is no evidence thoracic aortic aneurysm or dissection. Heart is normal in size.  No pericardial effusion. Three-vessel coronary atherosclerosis. Mediastinum/Nodes: Small mediastinal lymph nodes which do not meet pathologic CT size criteria. Visualized thyroid is unremarkable. Lungs/Pleura: Moderate centrilobular and paraseptal emphysematous changes, upper lung predominant. No suspicious pulmonary nodules. Mild linear scarring/atelectasis in the left lower lobe. No focal consolidation. No pleural effusion or pneumothorax. Upper Abdomen: Visualized upper abdomen is notable for a 15 mm hyperdense lesion in the right upper kidney (series 4/image 137), previously 12 mm, indeterminate. Additional bilateral renal cysts. Vascular calcifications. Musculoskeletal: Visualized osseous structures are within normal limits. Review of the MIP images confirms the above findings. IMPRESSION: No evidence of pulmonary embolism. 15 mm hyperdense lesion in the right upper kidney, previously 12 mm, indeterminate. Given the small size, consider follow-up MRI abdomen with/without contrast in 3-6 months. Aortic Atherosclerosis (ICD10-I70.0) and Emphysema (ICD10-J43.9). Electronically Signed   By:  SJulian HyM.D.   On: 11/10/2021 23:01   DG Chest 1 View  Result Date: 11/10/2021 CLINICAL DATA:  Shortness of breath.  Recent lumbar fusion. EXAM: CHEST  1 VIEW COMPARISON:  Radiographs 06/11/2008.  CT 09/18/2018. FINDINGS: 2035 hours. Two views submitted. Incomplete inclusion of the right costophrenic angle. The heart size and mediastinal contours are stable with aortic atherosclerosis. The lungs appear clear. No pneumothorax or significant pleural effusion. No acute osseous findings are evident. Telemetry leads overlie the chest. IMPRESSION: No evidence of active cardiopulmonary process. Electronically Signed   By: WRichardean SaleM.D.   On: 11/10/2021 20:41     Data Reviewed: Relevant notes from primary care and specialist visits, past discharge summaries as available in EHR, including Care Everywhere. Prior diagnostic testing as pertinent to current admission diagnoses Updated medications and problem lists for reconciliation ED course, including vitals, labs, imaging, treatment and response to treatment Triage notes, nursing and pharmacy notes and ED provider's notes Notable results as noted in HPI   Assessment and Plan: * Dizziness Could be multifactorial and related to her PVCs, elevated blood pressure, Pain meds with OSA Continuous cardiac monitoring overnight Blood pressure control Orthostatic vitals Echo Cardiology consult  Frequent PVCs First troponin negative and EKG nonacute Supplement potassium and magnesium to keep potassium over 4 and magnesium over 2 Continuous cardiac monitoring We will give a one-time IV dose of metoprolol  Hypertensive urgency SBP in the 170s and patient on multiple antihypertensives Continue home amlodipine, clonidine, Cardura, metoprolol and valsartan hydrochlorothiazide  OSA on CPAP with O2 bleed in CPAP nightly with O2  COPD (chronic obstructive pulmonary disease) ([place] Not acutely exacerbated Continue Daliresp and  Advair DuoNebs as needed  Obesity (BMI 30-39.9) Complicating factor to overall prognosis and care  Diabetes mellitus without complication (HCC) Sliding scale insulin coverage  BPH (benign prostatic hyperplasia) Continue Cardura  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 Continue home pain meds and muscle relaxants        DVT prophylaxis: Lovenox  Consults: [facility] cardiology Dr CHarrell Gave Advance Care Planning:   Code Status: Prior   Family Communication: son and wife at the bedside  Disposition Plan: Back to previous home environment  Severity of Illness:  The appropriate patient status for this patient is OBSERVATION. Observation status is judged to be reasonable and necessary in order to provide the required intensity of service to ensure the patient's safety. The patient's presenting symptoms, physical exam findings, and initial radiographic and laboratory data in the context of their medical condition is felt to place them at decreased risk for further clinical deterioration. Furthermore, it is anticipated that the patient will be medically stable for discharge from the hospital within 2 midnights of admission.   Author: Athena Masse, MD 11/10/2021 11:49 PM  For on call review [email or web address].

## 2021-11-10 NOTE — Assessment & Plan Note (Signed)
Not acutely exacerbated Continue Daliresp and Advair DuoNebs as needed

## 2021-11-10 NOTE — Progress Notes (Signed)
   REFERRING PHYSICIAN:  Meade Maw, [address] [place],  [place] [address]  DOS: 10/26/21 open L4-S1 TLIF   HISTORY OF PRESENT ILLNESS: Dean Simpson is approximately 2 weeks status post lumbar fusion. he is doing well from postoperative standpoint however he has had some issues with hypotension particularly after taking his blood pressure medications in the morning.  He is in contact with his primary care provider who has made some adjustments and is supposed to follow-up with him this week.  He continues to take pain medication about 3-4 times a day but is overall very pleased with his postoperative outcome thus far.  He denies any continued leg pain or incisional concerns.  PHYSICAL EXAMINATION:  General: Patient is well developed, well nourished, calm, collected, and in no apparent distress.   NEUROLOGICAL:  General: In no acute distress.   Awake, alert, oriented to person, place, and time.  Pupils equal round and reactive to light.  Facial tone is symmetric.  Tongue protrusion is midline.  There is no pronator drift.   Strength:            Side Iliopsoas Quads Hamstring PF DF EHL  R '5 5 5 5 5 5  '$ L '5 5 5 5 5 5   '$ Incision c/d/i   ROS (Neurologic):  Negative except as noted above  IMAGING: No interval imaging to review   ASSESSMENT/PLAN:  Dean Simpson is doing well approximately 2 weeks after lumbar fusion.  He has been having issues with hypotension over the last couple of days but has been in contact with his PCP in regards to this.  He has requested a refill of his pain medication and is only taking it 2-3 times a day.  I have provided this for him and recommended that he avoid taking it within an hour of his blood pressure medications if at all possible.  We discussed activity escalation and I have advised the patient to lift up to 10 pounds until 6 weeks after surgery, then increase up to 25 pounds until 12 weeks after  surgery.  After 12 weeks post-op, the patient advised to increase activity as tolerated.  he will follow up in 4 weeks with Dr. Izora Ribas with lumbar xrays prior.  He was encouraged to call the office in the interim should he have any questions or concerns.  He expressed understanding was in agreement with this plan.  Advised to contact the office if any questions or concerns arise.  Cooper Render PA-C Department of neurosurgery

## 2021-11-10 NOTE — Assessment & Plan Note (Signed)
SBP in the 170s and patient on multiple antihypertensives Continue home amlodipine, clonidine, Cardura, metoprolol and valsartan hydrochlorothiazide

## 2021-11-10 NOTE — Assessment & Plan Note (Signed)
CPAP nightly with O2

## 2021-11-10 NOTE — Assessment & Plan Note (Signed)
Sliding scale insulin coverage 

## 2021-11-10 NOTE — Assessment & Plan Note (Signed)
First troponin negative and EKG nonacute Supplement potassium and magnesium to keep potassium over 4 and magnesium over 2 Continuous cardiac monitoring We will give a one-time IV dose of metoprolol

## 2021-11-10 NOTE — Assessment & Plan Note (Signed)
Continue home pain meds and muscle relaxants

## 2021-11-11 ENCOUNTER — Observation Stay (HOSPITAL_COMMUNITY)
Admit: 2021-11-11 | Discharge: 2021-11-11 | Disposition: A | Discharge status: Home / Self Care | Payer: Medicare Other | Attending: Internal Medicine | Admitting: Internal Medicine

## 2021-11-11 DIAGNOSIS — I5189 Other ill-defined heart diseases: Secondary | ICD-10-CM

## 2021-11-11 DIAGNOSIS — R42 Dizziness and giddiness: Secondary | ICD-10-CM | POA: Diagnosis not present

## 2021-11-11 DIAGNOSIS — Z806 Family history of leukemia: Secondary | ICD-10-CM | POA: Diagnosis not present

## 2021-11-11 DIAGNOSIS — Z800 Family history of malignant neoplasm of digestive organs: Secondary | ICD-10-CM | POA: Diagnosis not present

## 2021-11-11 DIAGNOSIS — Z6836 Body mass index (BMI) 36.0-36.9, adult: Secondary | ICD-10-CM | POA: Diagnosis not present

## 2021-11-11 DIAGNOSIS — Z91013 Allergy to seafood: Secondary | ICD-10-CM | POA: Diagnosis not present

## 2021-11-11 DIAGNOSIS — R0602 Shortness of breath: Secondary | ICD-10-CM | POA: Diagnosis present

## 2021-11-11 DIAGNOSIS — R0609 Other forms of dyspnea: Secondary | ICD-10-CM

## 2021-11-11 DIAGNOSIS — I10 Essential (primary) hypertension: Secondary | ICD-10-CM | POA: Diagnosis present

## 2021-11-11 DIAGNOSIS — E669 Obesity, unspecified: Secondary | ICD-10-CM | POA: Diagnosis present

## 2021-11-11 DIAGNOSIS — I493 Ventricular premature depolarization: Secondary | ICD-10-CM | POA: Diagnosis present

## 2021-11-11 DIAGNOSIS — Z888 Allergy status to other drugs, medicaments and biological substances status: Secondary | ICD-10-CM | POA: Diagnosis not present

## 2021-11-11 DIAGNOSIS — E119 Type 2 diabetes mellitus without complications: Secondary | ICD-10-CM | POA: Diagnosis present

## 2021-11-11 DIAGNOSIS — Z8249 Family history of ischemic heart disease and other diseases of the circulatory system: Secondary | ICD-10-CM | POA: Diagnosis not present

## 2021-11-11 DIAGNOSIS — Z981 Arthrodesis status: Secondary | ICD-10-CM | POA: Diagnosis not present

## 2021-11-11 DIAGNOSIS — I160 Hypertensive urgency: Secondary | ICD-10-CM | POA: Diagnosis present

## 2021-11-11 DIAGNOSIS — E785 Hyperlipidemia, unspecified: Secondary | ICD-10-CM | POA: Diagnosis present

## 2021-11-11 DIAGNOSIS — G4733 Obstructive sleep apnea (adult) (pediatric): Secondary | ICD-10-CM | POA: Diagnosis present

## 2021-11-11 DIAGNOSIS — I472 Ventricular tachycardia, unspecified: Secondary | ICD-10-CM | POA: Diagnosis present

## 2021-11-11 DIAGNOSIS — Z7982 Long term (current) use of aspirin: Secondary | ICD-10-CM | POA: Diagnosis not present

## 2021-11-11 DIAGNOSIS — Z7984 Long term (current) use of oral hypoglycemic drugs: Secondary | ICD-10-CM | POA: Diagnosis not present

## 2021-11-11 DIAGNOSIS — N400 Enlarged prostate without lower urinary tract symptoms: Secondary | ICD-10-CM | POA: Diagnosis present

## 2021-11-11 DIAGNOSIS — J439 Emphysema, unspecified: Secondary | ICD-10-CM | POA: Diagnosis present

## 2021-11-11 DIAGNOSIS — M1990 Unspecified osteoarthritis, unspecified site: Secondary | ICD-10-CM | POA: Diagnosis present

## 2021-11-11 DIAGNOSIS — Z79899 Other long term (current) drug therapy: Secondary | ICD-10-CM | POA: Diagnosis not present

## 2021-11-11 HISTORY — DX: Other ill-defined heart diseases: I51.89

## 2021-11-11 LAB — BASIC METABOLIC PANEL
Anion gap: 10 (ref 5–15)
BUN: 17 mg/dL (ref 8–23)
CO2: 26 mmol/L (ref 22–32)
Calcium: 9.4 mg/dL (ref 8.9–10.3)
Chloride: 101 mmol/L (ref 98–111)
Creatinine, Ser: 0.66 mg/dL (ref 0.61–1.24)
GFR, Estimated: >60 mL/min (ref >60)
Glucose, Bld: 149 mg/dL — ABNORMAL HIGH (ref 70–99)
Potassium: 3.9 mmol/L (ref 3.5–5.1)
Sodium: 137 mmol/L (ref 135–145)

## 2021-11-11 LAB — ECHOCARDIOGRAM COMPLETE
AR max vel: 1.45 cm2
AV Area VTI: 1.65 cm2
AV Area mean vel: 1.36 cm2
AV Mean grad: 11 mmHg
AV Peak grad: 21.2 mmHg
Ao pk vel: 2.3 m/s
Area-P 1/2: 2.95 cm2
Height: 67 in
S' Lateral: 2.84 cm
Single Plane A4C EF: 66.4 %
Weight: 3760 oz

## 2021-11-11 LAB — CBC
HCT: 35.2 % — ABNORMAL LOW (ref 39.0–52.0)
Hemoglobin: 11.2 g/dL — ABNORMAL LOW (ref 13.0–17.0)
MCH: 28 pg (ref 26.0–34.0)
MCHC: 31.8 g/dL (ref 30.0–36.0)
MCV: 88 fL (ref 80.0–100.0)
Platelets: 544 10*3/uL — ABNORMAL HIGH (ref 150–400)
RBC: 4 MIL/uL — ABNORMAL LOW (ref 4.22–5.81)
RDW: 12.9 % (ref 11.5–15.5)
WBC: 9.5 10*3/uL (ref 4.0–10.5)
nRBC: 0 % (ref 0.0–0.2)

## 2021-11-11 LAB — CBG MONITORING, ED
Glucose-Capillary: 126 mg/dL — ABNORMAL HIGH (ref 70–99)
Glucose-Capillary: 167 mg/dL — ABNORMAL HIGH (ref 70–99)
Glucose-Capillary: 178 mg/dL — ABNORMAL HIGH (ref 70–99)
Glucose-Capillary: 180 mg/dL — ABNORMAL HIGH (ref 70–99)

## 2021-11-11 LAB — MAGNESIUM: Magnesium: 2.2 mg/dL (ref 1.7–2.4)

## 2021-11-11 LAB — HEMOGLOBIN A1C
Hgb A1c MFr Bld: 6.8 % — ABNORMAL HIGH (ref 4.8–5.6)
Mean Plasma Glucose: 148.46 mg/dL

## 2021-11-11 LAB — HIV ANTIBODY (ROUTINE TESTING W REFLEX): HIV Screen 4th Generation wRfx: NONREACTIVE

## 2021-11-11 MED ORDER — GABAPENTIN 300 MG PO CAPS
300.0000 mg | ORAL_CAPSULE | Freq: Every day | ORAL | Status: DC
  Administered 2021-11-11 (×2): 300 mg via ORAL
  Filled 2021-11-11 (×2): qty 1

## 2021-11-11 MED ORDER — VALSARTAN-HYDROCHLOROTHIAZIDE 320-25 MG PO TABS: 1.0000 | ORAL_TABLET | ORAL | Status: DC

## 2021-11-11 MED ORDER — IRBESARTAN 150 MG PO TABS
300.0000 mg | ORAL_TABLET | Freq: Every day | ORAL | Status: DC
  Administered 2021-11-11 – 2021-11-12 (×2): 300 mg via ORAL
  Filled 2021-11-11 (×3): qty 2

## 2021-11-11 MED ORDER — ENOXAPARIN SODIUM 60 MG/0.6ML IJ SOSY
0.5000 mg/kg | PREFILLED_SYRINGE | INTRAMUSCULAR | Status: DC
  Administered 2021-11-11 – 2021-11-12 (×2): 52.5 mg via SUBCUTANEOUS
  Filled 2021-11-11 (×2): qty 0.6

## 2021-11-11 MED ORDER — IPRATROPIUM BROMIDE 0.02 % IN SOLN
0.5000 mg | RESPIRATORY_TRACT | Status: DC
  Administered 2021-11-11 – 2021-11-12 (×3): 0.5 mg via RESPIRATORY_TRACT
  Filled 2021-11-11 (×4): qty 2.5

## 2021-11-11 MED ORDER — ONDANSETRON HCL 4 MG/2ML IJ SOLN: 4.0000 mg | Freq: Four times a day (QID) | INTRAMUSCULAR | Status: DC | PRN

## 2021-11-11 MED ORDER — SODIUM CHLORIDE 0.9% FLUSH
3.0000 mL | Freq: Two times a day (BID) | INTRAVENOUS | Status: DC
Start: 2021-11-11 — End: 2021-11-12
  Administered 2021-11-11 (×3): 3 mL via INTRAVENOUS

## 2021-11-11 MED ORDER — PERFLUTREN LIPID MICROSPHERE
1.0000 mL | INTRAVENOUS | Status: AC | PRN
  Administered 2021-11-11: 3 mL via INTRAVENOUS

## 2021-11-11 MED ORDER — BACLOFEN 10 MG PO TABS: 10.0000 mg | ORAL_TABLET | Freq: Three times a day (TID) | ORAL | Status: DC | PRN

## 2021-11-11 MED ORDER — DOXAZOSIN MESYLATE 4 MG PO TABS
4.0000 mg | ORAL_TABLET | Freq: Every day | ORAL | Status: DC
  Administered 2021-11-11 – 2021-11-12 (×2): 4 mg via ORAL
  Filled 2021-11-11 (×2): qty 1

## 2021-11-11 MED ORDER — METOPROLOL TARTRATE 5 MG/5ML IV SOLN
2.5000 mg | Freq: Once | INTRAVENOUS | Status: AC
  Administered 2021-11-11: 2.5 mg via INTRAVENOUS
  Filled 2021-11-11: qty 5

## 2021-11-11 MED ORDER — METOPROLOL SUCCINATE ER 50 MG PO TB24
100.0000 mg | ORAL_TABLET | Freq: Every day | ORAL | Status: DC
  Administered 2021-11-11 – 2021-11-12 (×2): 100 mg via ORAL
  Filled 2021-11-11 (×2): qty 2

## 2021-11-11 MED ORDER — ASPIRIN 81 MG PO TBEC
81.0000 mg | DELAYED_RELEASE_TABLET | Freq: Every day | ORAL | Status: DC
  Administered 2021-11-11 – 2021-11-12 (×2): 81 mg via ORAL
  Filled 2021-11-11 (×2): qty 1

## 2021-11-11 MED ORDER — MOMETASONE FURO-FORMOTEROL FUM 200-5 MCG/ACT IN AERO: 2.0000 | INHALATION_SPRAY | Freq: Two times a day (BID) | RESPIRATORY_TRACT | Status: DC

## 2021-11-11 MED ORDER — HYDROCHLOROTHIAZIDE 25 MG PO TABS
25.0000 mg | ORAL_TABLET | Freq: Every day | ORAL | Status: DC
  Administered 2021-11-11 – 2021-11-12 (×2): 25 mg via ORAL
  Filled 2021-11-11 (×2): qty 1

## 2021-11-11 MED ORDER — ACETAMINOPHEN 325 MG PO TABS: 650.0000 mg | ORAL_TABLET | Freq: Four times a day (QID) | ORAL | Status: DC | PRN

## 2021-11-11 MED ORDER — ONDANSETRON HCL 4 MG PO TABS
4.0000 mg | ORAL_TABLET | Freq: Four times a day (QID) | ORAL | Status: DC | PRN
  Filled 2021-11-11: qty 1

## 2021-11-11 MED ORDER — ACETAMINOPHEN 650 MG RE SUPP: 650.0000 mg | Freq: Four times a day (QID) | RECTAL | Status: DC | PRN

## 2021-11-11 MED ORDER — ATORVASTATIN CALCIUM 20 MG PO TABS
40.0000 mg | ORAL_TABLET | Freq: Every day | ORAL | Status: DC
  Administered 2021-11-11 – 2021-11-12 (×2): 40 mg via ORAL
  Filled 2021-11-11 (×2): qty 2

## 2021-11-11 MED ORDER — AMLODIPINE BESYLATE 10 MG PO TABS
10.0000 mg | ORAL_TABLET | Freq: Every day | ORAL | Status: DC
  Administered 2021-11-11 – 2021-11-12 (×2): 10 mg via ORAL
  Filled 2021-11-11: qty 1
  Filled 2021-11-11: qty 2

## 2021-11-11 MED ORDER — ALBUTEROL SULFATE (2.5 MG/3ML) 0.083% IN NEBU
2.5000 mg | INHALATION_SOLUTION | RESPIRATORY_TRACT | Status: DC
  Administered 2021-11-11 – 2021-11-12 (×3): 2.5 mg via RESPIRATORY_TRACT
  Filled 2021-11-11 (×10): qty 3

## 2021-11-11 MED ORDER — ROFLUMILAST 500 MCG PO TABS
250.0000 ug | ORAL_TABLET | Freq: Every day | ORAL | Status: DC
Start: 2021-11-11 — End: 2021-11-12
  Administered 2021-11-11 – 2021-11-12 (×2): 250 ug via ORAL
  Filled 2021-11-11 (×2): qty 1

## 2021-11-11 MED ORDER — SODIUM CHLORIDE 0.9 % IV SOLN: INTRAVENOUS | Status: DC

## 2021-11-11 MED ORDER — INSULIN ASPART 100 UNIT/ML IJ SOLN
0.0000 [IU] | INTRAMUSCULAR | Status: DC
  Administered 2021-11-11 (×3): 3 [IU] via SUBCUTANEOUS
  Administered 2021-11-12: 5 [IU] via SUBCUTANEOUS
  Administered 2021-11-12: 2 [IU] via SUBCUTANEOUS
  Filled 2021-11-11 (×6): qty 1

## 2021-11-11 MED ORDER — OXYCODONE HCL 5 MG PO TABS: 5.0000 mg | ORAL_TABLET | Freq: Three times a day (TID) | ORAL | Status: DC | PRN

## 2021-11-11 NOTE — ED Notes (Signed)
Pt asked to be taken off CPAP at this time.  States "its too much air.  Its not like my one at home and I don't like it."  Pt placed on 2L via [place] for sleep at this time.

## 2021-11-11 NOTE — Progress Notes (Signed)
*  PRELIMINARY RESULTS* Echocardiogram 2D Echocardiogram has been performed.  Dean Simpson 11/11/2021, 9:58 AM

## 2021-11-11 NOTE — Progress Notes (Signed)
PHARMACIST - PHYSICIAN COMMUNICATION  CONCERNING:  Enoxaparin (Lovenox) for DVT Prophylaxis    RECOMMENDATION: Patient was prescribed enoxaprin '40mg'$  q24 hours for VTE prophylaxis.   Filed Weights   11/10/21 2011  Weight: 106.6 kg (235 lb)    Body mass index is 36.81 kg/m.  Estimated Creatinine Clearance: 98 mL/min (by C-G formula based on SCr of 0.84 mg/dL).   Based on [place] patient is candidate for enoxaparin 0.'5mg'$ /kg TBW SQ every 24 hours based on BMI being >30.  DESCRIPTION: Pharmacy has adjusted enoxaparin dose per [facility] policy.  Patient is now receiving enoxaparin 0.5 mg.kg every 24 hours   Renda Rolls, PharmD, [facility] 11/11/2021 12:13 AM

## 2021-11-11 NOTE — Progress Notes (Signed)
PROGRESS NOTE    Dean Simpson  HUT:[id number] DOB: 1951/04/16 DOA: 11/10/2021 PCP: Adin Hector, MD    Brief Narrative:  Dean Simpson is a 71 y.o. male with medical history significant for DM, HTN, BPH, obesity, COPD, OSA on CPAP with O2 bleed-in and as needed oxygen, s/p lumbar fusion on 10/26/2021 with uneventful postoperative course who presents to the ED for evaluation of dizziness and shortness of breath.  He also noted that his blood pressure has been running high in the 170s all day.  Patient denies chest pain or palpitations.  Denies fever or chills.  His back pain is controlled with oxycodone and a muscle relaxant.  EMS reports seeing bigeminy and trigeminy and multiple PVCs. ED course and data review: BP 163/92 with otherwise normal vitals Labs: WBC 10,700 and hemoglobin 11.5, platelets 5 55,000.  Glucose 170.  Potassium 3.6 and magnesium 1.9.  Troponin 11.  D-dimer 1.9.  7/26 +orthostatic  Consultants:  cardiology  Procedures:   Antimicrobials:      Subjective: No dizziness while lying in bed. Denies cp, syncope, sob.  Objective: Vitals:   11/11/21 0107 11/11/21 0423 11/11/21 0638 11/11/21 0700  BP:  (!) 157/65 (!) 143/61 (!) 142/68  Pulse:  76 80 80  Resp:  '14 19 20  '$ Temp: 97.9 F (36.6 C)  97.7 F (36.5 C)   TempSrc: Oral  Oral   SpO2:  98% 97% 97%  Weight:      Height:       No intake or output data in the 24 hours ending 11/11/21 0848 Filed Weights   11/10/21 2011  Weight: 106.6 kg    Examination: Calm, NAD Cta no w/r Reg s1/s2 no gallop Soft benign +bs No edema Aaoxox3  Mood and affect appropriate in current setting     Data Reviewed: I have personally reviewed following labs and imaging studies  CBC: Recent Labs  Lab 11/10/21 2016 11/11/21 0341  WBC 10.7* 9.5  NEUTROABS 8.3*  --   HGB 11.5* 11.2*  HCT 36.3* 35.2*  MCV 88.5 88.0  PLT 555* 656*   Basic Metabolic Panel: Recent Labs  Lab  11/10/21 2016 11/11/21 0341  NA 134* 137  K 3.6 3.9  CL 99 101  CO2 23 26  GLUCOSE 170* 149*  BUN 22 17  CREATININE 0.84 0.66  CALCIUM 9.2 9.4  MG 1.9 2.2   GFR: Estimated Creatinine Clearance: 102.9 mL/min (by C-G formula based on SCr of 0.66 mg/dL). Liver Function Tests: Recent Labs  Lab 11/10/21 2016  AST 18  ALT 28  ALKPHOS 89  BILITOT 0.8  PROT 7.3  ALBUMIN 3.6   No results for input(s): "LIPASE", "AMYLASE" in the last 168 hours. No results for input(s): "AMMONIA" in the last 168 hours. Coagulation Profile: No results for input(s): "INR", "PROTIME" in the last 168 hours. Cardiac Enzymes: No results for input(s): "CKTOTAL", "CKMB", "CKMBINDEX", "TROPONINI" in the last 168 hours. BNP (last 3 results) No results for input(s): "PROBNP" in the last 8760 hours. HbA1C: No results for input(s): "HGBA1C" in the last 72 hours. CBG: No results for input(s): "GLUCAP" in the last 168 hours. Lipid Profile: No results for input(s): "CHOL", "HDL", "LDLCALC", "TRIG", "CHOLHDL", "LDLDIRECT" in the last 72 hours. Thyroid Function Tests: No results for input(s): "TSH", "T4TOTAL", "FREET4", "T3FREE", "THYROIDAB" in the last 72 hours. Anemia Panel: No results for input(s): "VITAMINB12", "FOLATE", "FERRITIN", "TIBC", "IRON", "RETICCTPCT" in the last 72 hours. Sepsis Labs: No results for input(s): "  PROCALCITON", "LATICACIDVEN" in the last 168 hours.  No results found for this or any previous visit (from the past 240 hour(s)).       Radiology Studies: CT Angio Chest PE W and/or Wo Contrast  Result Date: 11/10/2021 CLINICAL DATA:  Shortness of breath, positive D-dimer, recent lumbar fusion surgery EXAM: CT ANGIOGRAPHY CHEST WITH CONTRAST TECHNIQUE: Multidetector CT imaging of the chest was performed using the standard protocol during bolus administration of intravenous contrast. Multiplanar CT image reconstructions and MIPs were obtained to evaluate the vascular anatomy. RADIATION  DOSE REDUCTION: This exam was performed according to the departmental dose-optimization program which includes automated exposure control, adjustment of the mA and/or kV according to patient size and/or use of iterative reconstruction technique. CONTRAST:  152m OMNIPAQUE IOHEXOL 350 MG/ML SOLN COMPARISON:  Chest radiograph dated 11/10/2021. CT chest dated 09/18/2018. FINDINGS: Cardiovascular: Satisfactory opacification the bilateral pulmonary arteries to the segmental level. No evidence of pulmonary embolism. Although not tailored for evaluation of the thoracic aorta, there is no evidence thoracic aortic aneurysm or dissection. Heart is normal in size.  No pericardial effusion. Three-vessel coronary atherosclerosis. Mediastinum/Nodes: Small mediastinal lymph nodes which do not meet pathologic CT size criteria. Visualized thyroid is unremarkable. Lungs/Pleura: Moderate centrilobular and paraseptal emphysematous changes, upper lung predominant. No suspicious pulmonary nodules. Mild linear scarring/atelectasis in the left lower lobe. No focal consolidation. No pleural effusion or pneumothorax. Upper Abdomen: Visualized upper abdomen is notable for a 15 mm hyperdense lesion in the right upper kidney (series 4/image 137), previously 12 mm, indeterminate. Additional bilateral renal cysts. Vascular calcifications. Musculoskeletal: Visualized osseous structures are within normal limits. Review of the MIP images confirms the above findings. IMPRESSION: No evidence of pulmonary embolism. 15 mm hyperdense lesion in the right upper kidney, previously 12 mm, indeterminate. Given the small size, consider follow-up MRI abdomen with/without contrast in 3-6 months. Aortic Atherosclerosis (ICD10-I70.0) and Emphysema (ICD10-J43.9). Electronically Signed   By: SJulian HyM.D.   On: 11/10/2021 23:01   DG Chest 1 View  Result Date: 11/10/2021 CLINICAL DATA:  Shortness of breath.  Recent lumbar fusion. EXAM: CHEST  1 VIEW  COMPARISON:  Radiographs 06/11/2008.  CT 09/18/2018. FINDINGS: 2035 hours. Two views submitted. Incomplete inclusion of the right costophrenic angle. The heart size and mediastinal contours are stable with aortic atherosclerosis. The lungs appear clear. No pneumothorax or significant pleural effusion. No acute osseous findings are evident. Telemetry leads overlie the chest. IMPRESSION: No evidence of active cardiopulmonary process. Electronically Signed   By: WRichardean SaleM.D.   On: 11/10/2021 20:41        Scheduled Meds:  ipratropium  0.5 mg Nebulization Q4H   And   albuterol  2.5 mg Nebulization Q4H   amLODipine  10 mg Oral Daily   aspirin EC  81 mg Oral Daily   atorvastatin  40 mg Oral Daily   doxazosin  4 mg Oral Daily   enoxaparin (LOVENOX) injection  0.5 mg/kg Subcutaneous Q24H   gabapentin  300 mg Oral QHS   irbesartan  300 mg Oral Q breakfast   And   hydrochlorothiazide  25 mg Oral Q breakfast   metoprolol succinate  100 mg Oral Q breakfast   mometasone-formoterol  2 puff Inhalation BID   roflumilast  250 mcg Oral Q breakfast   sodium chloride flush  3 mL Intravenous Q12H   Continuous Infusions:  Assessment & Plan:   Principal Problem:   Dizziness Active Problems:   Frequent PVCs   Hypertensive urgency  COPD (chronic obstructive pulmonary disease) (HCC)   OSA on CPAP with O2 bleed in   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   BPH (benign prostatic hyperplasia)   Diabetes mellitus without complication (HCC)   Obesity (BMI 30-39.9)   Dizziness Could be multifactorial and related to her PVCs, elevated blood pressure,  7/26 +orthostatics Echo pending Will give gentle ivf for hydration Compression stocking for standing   Frequent PVCs First troponin negative and EKG nonacute Supplement potassium and magnesium to keep potassium over 4 and magnesium over 2 7/29 echo pending On beta blk   Hypertensive urgency Cards consulted May need to hold hctz since orthostatics  until improves   OSA on CPAP with O2 bleed in Cpap at night   COPD (chronic obstructive pulmonary disease) ([place]) Not acutely exacerbated Continue Daliresp and Advair DuoNebs as needed   Obesity (BMI 30-39.9) Complicating factor to overall prognosis and care   Diabetes mellitus without complication (HCC) Sliding scale insulin coverage   BPH (benign prostatic hyperplasia) Continue Cardura   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        LOS: 0 days   Time spent: 79mn    SNolberto Hanlon MD [facility] Pager [phone]  If 7PM-7AM, please contact night-coverage 11/11/2021, 8:48 AM

## 2021-11-12 ENCOUNTER — Other Ambulatory Visit: Payer: Self-pay

## 2021-11-12 LAB — GLUCOSE, CAPILLARY: Glucose-Capillary: 212 mg/dL — ABNORMAL HIGH (ref 70–99)

## 2021-11-12 LAB — CBG MONITORING, ED: Glucose-Capillary: 147 mg/dL — ABNORMAL HIGH (ref 70–99)

## 2021-11-12 MED ORDER — DOXAZOSIN MESYLATE 4 MG PO TABS: 4.0000 mg | ORAL_TABLET | Freq: Every day | ORAL | Status: AC

## 2021-11-12 MED ORDER — ALBUTEROL SULFATE (2.5 MG/3ML) 0.083% IN NEBU: 2.5000 mg | INHALATION_SOLUTION | RESPIRATORY_TRACT | Status: DC | PRN

## 2021-11-12 MED ORDER — IPRATROPIUM BROMIDE 0.02 % IN SOLN: 0.5000 mg | RESPIRATORY_TRACT | Status: DC | PRN

## 2021-11-12 NOTE — Progress Notes (Signed)
Karion Georgios Pender to be D/C'd  per MD order.  Discussed with the patient and all questions fully answered.  VSS, Skin clean, dry and intact without evidence of skin break down, no evidence of skin tears noted.  IV catheter discontinued intact. Site without signs and symptoms of complications. Dressing and pressure applied.  An After Visit Summary was printed and given to the patient. Patient received prescription.  D/c education completed with patient/family including follow up instructions, medication list, d/c activities limitations if indicated, with other d/c instructions as indicated by MD - patient able to verbalize understanding, all questions fully answered.   Patient instructed to return to ED, call 911, or call MD for any changes in condition.   Patient to be escorted via [place], and D/C home via private auto.

## 2021-11-12 NOTE — Discharge Summary (Signed)
Tamotsu Georgios Dibbern TDV:[id number] DOB: 03/16/1951 DOA: 11/10/2021  PCP: Adin Hector, MD  Admit date: 11/10/2021 Discharge date: 11/12/2021  Admitted From: Home Disposition:  home  Recommendations for Outpatient Follow-up:  Follow up with PCP in 1 week Please obtain BMP/CBC in one week Please follow up with cardiology in 1 to 2 weeks      Discharge Condition:Stable CODE STATUS: Full Diet recommendation: Heart Healthy / Carb Modified   Brief/Interim Summary: Per GGY:IRSWNIOEVO Georgios Herrington is a 71 y.o. male with medical history significant for DM, HTN, BPH, obesity, COPD, OSA on CPAP with O2 bleed-in and as needed oxygen, s/p lumbar fusion on 10/26/2021 with uneventful postoperative course who presents to the ED for evaluation of dizziness and shortness of breath.  He also noted that his blood pressure has been running high in the 170s all day.  Patient denies chest pain or palpitations.  Denies fever or chills.  His back pain is controlled with oxycodone and a muscle relaxant.  EMS reports seeing bigeminy and trigeminy and multiple PVCs. ED course and data review: BP 163/92 with otherwise normal vitals Labs: WBC 10,700 and hemoglobin 11.5, platelets 5 55,000.  Glucose 170.  Potassium 3.6 and magnesium 1.9.  Troponin 11.  D-dimer 1.9. CTA negative for pulmonary embolism.  He was found with orthostatics.  He was gently hydrated and repeat orthostatics has improved.  He remains asymptomatic and would like to go home.  He will follow-up with cardiology as outpatient.  His echocardiogram revealed normal EF with no wall motion abnormalities.   Dizziness Could be multifactorial and related to her PVCs, elevated blood pressure,   +orthostatics Echo nml EF.  See full results below. Will f/u with cardiology Did receive gentle hydration Discussed with patient about hydration during the day Will give gentle ivf for hydration Compression stocking for standing     Frequent  PVCs negative and EKG nonacute Troponin negative Likely from hypertensive urgency On beta-blocker Echo normal EF Follow-up with cardiology as outpatient      Hypertensive urgency Improved   OSA on CPAP with O2 bleed in Cpap at night   COPD (chronic obstructive pulmonary disease) ([place]) Not acutely exacerbated Continue home meds   Obesity (BMI 30-39.9) Complicating factor to overall prognosis and care   Diabetes mellitus without complication ([place]) Continue home meds   BPH (benign prostatic hyperplasia) Continue Cardura   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 Continue home pain meds and muscle relaxants  Rt kidney lesion Found on CT scan. 15 mm hyperdense lesion in the right upper kidney, previously 12 mm, indeterminate. Given the small size, consider follow-up MRI abdomen with/without contrast in 3-6 months. F/u with pcp for further management.  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1 MG tablet Commonly known as: CATAPRES       TAKE these medications    Advair HFA 115-21 MCG/ACT inhaler Generic drug: fluticasone-salmeterol Inhale 2 puffs into the lungs in the morning and at  bedtime.   ALBUTEROL-IPRATROPIUM IN Inhale 3 mLs into the lungs 2 (two) times daily.   amLODipine 10 MG tablet Commonly known as: NORVASC Take 10 mg by mouth every morning.   aspirin EC 81 MG tablet Take 81 mg by mouth daily.   atorvastatin 80 MG tablet Commonly known as: LIPITOR Take 40 mg by mouth at bedtime.   baclofen 10 MG tablet Commonly known as: LIORESAL Take 1 tablet (10 mg total) by mouth 3 (three) times daily as needed for muscle spasms.   canagliflozin 300 MG Tabs tablet Commonly known as: INVOKANA Take 300 mg by mouth daily before breakfast.   Daliresp 250 MCG Tabs Generic drug: Roflumilast Take 1 tablet by mouth daily at 6 (six) AM.   doxazosin 4 MG tablet Commonly known as: CARDURA Take 1 tablet (4 mg total) by mouth daily. What changed: when to take this   fluocinonide-emollient 0.05 % cream Commonly known as: LIDEX-E Apply 1 application topically 2 (two) times daily.   gabapentin 300 MG capsule Commonly known as: NEURONTIN Take 300 mg by mouth at bedtime.   glimepiride 4 MG tablet Commonly known as: AMARYL Take 4 mg by mouth daily with breakfast.   hydrOXYzine 25 MG tablet Commonly known as: ATARAX Take 25 mg by mouth 3 (three) times daily as needed for itching.   metFORMIN 500 MG tablet Commonly known as: GLUCOPHAGE Take 1,000 mg by mouth 2 (two) times daily with a meal.   metoprolol succinate 100 MG 24 hr tablet Commonly known as: TOPROL-XL Take 100 mg by mouth every morning. Take with or immediately following a meal.   nystatin ointment Commonly known as: MYCOSTATIN Apply 1 application topically as needed.   ondansetron 4 MG tablet Commonly known as: ZOFRAN Take 1 tablet (4 mg total) by mouth every 6 (six) hours as needed for nausea or vomiting.   oxyCODONE 5 MG immediate release tablet Commonly known as: Roxicodone Take 1 tablet (5 mg total) by mouth 3 (three) times daily as needed for up to 5 days for severe pain.    prochlorperazine 5 MG tablet Commonly known as: COMPAZINE Take 1 tablet (5 mg total) by mouth every 6 (six) hours as needed for nausea or vomiting.   Semaglutide (2 MG/DOSE) 8 MG/3ML Sopn Inject 1 Dose into the skin once a week. On Sundays   senna 8.6 MG Tabs tablet Commonly known as: SENOKOT Take 1 tablet (8.6 mg total) by mouth 2 (two) times daily as needed for mild constipation.   triamcinolone cream 0.5 % Commonly known as: KENALOG Apply 1 Application topically as needed.   valsartan-hydrochlorothiazide 320-25 MG tablet Commonly known as: DIOVAN-HCT Take 1 tablet by mouth every morning.        Follow-up Information     Tama High III, MD Follow up in 1 week(s).   Specialty: Internal Medicine Contact information: [place] [address] [phone]         Minna Merritts, MD Follow up in 1 week(s).   Specialty: Cardiology Why: 1-2 weeks  Contact information: [address] [place] [address] [phone]                Allergies  Allergen Reactions   Iodine Swelling    shellfish shellfish   Shellfish Allergy Itching and Swelling    NOT AT EVERY EXPOSURE    Consultations:    Procedures/Studies: ECHOCARDIOGRAM COMPLETE  Result Date: 11/11/2021    ECHOCARDIOGRAM REPORT   Patient Name:   NILO FALLIN Date of Exam: 11/11/2021 Medical Rec #:  [id number]                     Height:       67.0 in Accession #:    [id number]                    Weight:       235.0 lb Date of Birth:  [birth year]                     BSA:          2.166 m Patient Age:    71 years                      BP:           142/68 mmHg Patient Gender: M                             HR:           74 bpm. Exam Location:  [facility] Procedure: 2D Echo, Color Doppler, Cardiac Doppler and Intracardiac            Opacification Agent Indications:     R06.00 Dyspnea  History:         Patient has no prior history of  Echocardiogram examinations.                  COPD; Risk Factors:Current Smoker, Hypertension, Dyslipidemia                  and Sleep Apnea.  Sonographer:     Charmayne Sheer Referring Phys:  [id number] Athena Masse Diagnosing Phys: Nelva Bush MD  Sonographer Comments: Technically difficult study due to poor echo windows and Technically challenging study due to limited acoustic windows. Image acquisition challenging due to patient body habitus and Image acquisition challenging due to COPD. IMPRESSIONS  1. Left ventricular ejection fraction, by estimation, is 65 to 70%. The left ventricle has normal function. The left ventricle has no regional wall motion abnormalities. Left ventricular diastolic parameters are consistent with Grade I diastolic dysfunction (impaired relaxation).  2. Right ventricular systolic function is normal. The right ventricular size is normal. Tricuspid regurgitation signal is inadequate for assessing PA pressure.  3. The mitral valve was not well visualized. No evidence of mitral valve regurgitation. No evidence of mitral stenosis.  4. The aortic valve was not well visualized. There is mild thickening of the aortic valve. Aortic valve regurgitation is not visualized. Mild aortic valve stenosis. Aortic valve mean gradient measures 11.0 mmHg.  5. The inferior vena cava is normal in size with greater than 50% respiratory variability, suggesting right atrial pressure of 3 mmHg. FINDINGS  Left Ventricle: Left ventricular ejection fraction, by estimation, is 65 to 70%. The left ventricle has normal function. The left ventricle has no regional wall motion abnormalities. Definity contrast agent was  given IV to delineate the left ventricular  endocardial borders. The left ventricular internal cavity size was normal in size. There is no left ventricular hypertrophy. Left ventricular diastolic parameters are consistent with Grade I diastolic dysfunction (impaired relaxation). Right Ventricle: The right  ventricular size is normal. No increase in right ventricular wall thickness. Right ventricular systolic function is normal. Tricuspid regurgitation signal is inadequate for assessing PA pressure. Left Atrium: Left atrial size was normal in size. Right Atrium: Right atrial size was normal in size. Pericardium: There is no evidence of pericardial effusion. Mitral Valve: The mitral valve was not well visualized. No evidence of mitral valve regurgitation. No evidence of mitral valve stenosis. Tricuspid Valve: The tricuspid valve is not well visualized. Tricuspid valve regurgitation is not demonstrated. Aortic Valve: The aortic valve was not well visualized. There is mild thickening of the aortic valve. Aortic valve regurgitation is not visualized. Mild aortic stenosis is present. Aortic valve mean gradient measures 11.0 mmHg. Aortic valve peak gradient  measures 21.2 mmHg. Aortic valve area, by VTI measures 1.65 cm. Pulmonic Valve: The pulmonic valve was not well visualized. Pulmonic valve regurgitation is not visualized. No evidence of pulmonic stenosis. Aorta: The aortic root is normal in size and structure. Venous: The inferior vena cava is normal in size with greater than 50% respiratory variability, suggesting right atrial pressure of 3 mmHg. IAS/Shunts: The interatrial septum was not well visualized.  LEFT VENTRICLE PLAX 2D LVIDd:         3.97 cm     Diastology LVIDs:         2.84 cm     LV e' medial:    6.53 cm/s LV PW:         0.91 cm     LV E/e' medial:  10.0 LV IVS:        0.85 cm     LV e' lateral:   6.96 cm/s LVOT diam:     2.10 cm     LV E/e' lateral: 9.4 LV SV:         64 LV SV Index:   29 LVOT Area:     3.46 cm  LV Volumes (MOD) LV vol d, MOD A4C: 89.8 ml LV vol s, MOD A4C: 30.2 ml LV SV MOD A4C:     89.8 ml RIGHT VENTRICLE RV Basal diam:  3.96 cm TAPSE (M-mode): 2.4 cm LEFT ATRIUM           Index        RIGHT ATRIUM           Index LA Vol (A4C): 58.4 ml 26.97 ml/m  RA Area:     15.20 cm                                     RA Volume:   40.00 ml  18.47 ml/m  AORTIC VALVE                     PULMONIC VALVE AV Area (Vmax):    1.45 cm      PV Vmax:       1.12 m/s AV Area (Vmean):   1.36 cm      PV Peak grad:  5.0 mmHg AV Area (VTI):     1.65 cm AV Vmax:           230.00 cm/s AV Vmean:  152.500 cm/s AV VTI:            0.386 m AV Peak Grad:      21.2 mmHg AV Mean Grad:      11.0 mmHg LVOT Vmax:         96.60 cm/s LVOT Vmean:        60.000 cm/s LVOT VTI:          0.184 m LVOT/AV VTI ratio: 0.48  AORTA Ao Root diam: 3.00 cm MITRAL VALVE MV Area (PHT): 2.95 cm    SHUNTS MV Decel Time: 257 msec    Systemic VTI:  0.18 m MV E velocity: 65.10 cm/s  Systemic Diam: 2.10 cm MV A velocity: 81.00 cm/s MV E/A ratio:  0.80 Harrell Gave End MD Electronically signed by Nelva Bush MD Signature Date/Time: 11/11/2021/3:32:21 PM    Final    CT Angio Chest PE W and/or Wo Contrast  Result Date: 11/10/2021 CLINICAL DATA:  Shortness of breath, positive D-dimer, recent lumbar fusion surgery EXAM: CT ANGIOGRAPHY CHEST WITH CONTRAST TECHNIQUE: Multidetector CT imaging of the chest was performed using the standard protocol during bolus administration of intravenous contrast. Multiplanar CT image reconstructions and MIPs were obtained to evaluate the vascular anatomy. RADIATION DOSE REDUCTION: This exam was performed according to the departmental dose-optimization program which includes automated exposure control, adjustment of the mA and/or kV according to patient size and/or use of iterative reconstruction technique. CONTRAST:  17m OMNIPAQUE IOHEXOL 350 MG/ML SOLN COMPARISON:  Chest radiograph dated 11/10/2021. CT chest dated 09/18/2018. FINDINGS: Cardiovascular: Satisfactory opacification the bilateral pulmonary arteries to the segmental level. No evidence of pulmonary embolism. Although not tailored for evaluation of the thoracic aorta, there is no evidence thoracic aortic aneurysm or dissection. Heart is normal in size.   No pericardial effusion. Three-vessel coronary atherosclerosis. Mediastinum/Nodes: Small mediastinal lymph nodes which do not meet pathologic CT size criteria. Visualized thyroid is unremarkable. Lungs/Pleura: Moderate centrilobular and paraseptal emphysematous changes, upper lung predominant. No suspicious pulmonary nodules. Mild linear scarring/atelectasis in the left lower lobe. No focal consolidation. No pleural effusion or pneumothorax. Upper Abdomen: Visualized upper abdomen is notable for a 15 mm hyperdense lesion in the right upper kidney (series 4/image 137), previously 12 mm, indeterminate. Additional bilateral renal cysts. Vascular calcifications. Musculoskeletal: Visualized osseous structures are within normal limits. Review of the MIP images confirms the above findings. IMPRESSION: No evidence of pulmonary embolism. 15 mm hyperdense lesion in the right upper kidney, previously 12 mm, indeterminate. Given the small size, consider follow-up MRI abdomen with/without contrast in 3-6 months. Aortic Atherosclerosis (ICD10-I70.0) and Emphysema (ICD10-J43.9). Electronically Signed   By: SJulian HyM.D.   On: 11/10/2021 23:01   DG Chest 1 View  Result Date: 11/10/2021 CLINICAL DATA:  Shortness of breath.  Recent lumbar fusion. EXAM: CHEST  1 VIEW COMPARISON:  Radiographs 06/11/2008.  CT 09/18/2018. FINDINGS: 2035 hours. Two views submitted. Incomplete inclusion of the right costophrenic angle. The heart size and mediastinal contours are stable with aortic atherosclerosis. The lungs appear clear. No pneumothorax or significant pleural effusion. No acute osseous findings are evident. Telemetry leads overlie the chest. IMPRESSION: No evidence of active cardiopulmonary process. Electronically Signed   By: WRichardean SaleM.D.   On: 11/10/2021 20:41   DG Lumbar Spine 2-3 Views  Result Date: 10/26/2021 CLINICAL DATA:  Portable imaging provided for lumbar spine fusion EXAM: EXAM LUMBAR SPINE - 2-3 VIEW;  DG C-ARM 1-60 MIN-NO REPORT COMPARISON:  None Available. FLUOROSCOPY: Exposure Index (as provided by the  fluoroscopic device): 150.84 mGy Kerma FINDINGS: Submitted images show placement of bilateral pedicle screws interconnecting rods at L4, L5 and S1 with intervertebral metal spacers at L4-L5 and L5-S1. The orthopedic hardware appears well seated and aligned. IMPRESSION: Operative imaging provided for L4 through S1 fusion. Electronically Signed   By: Lajean Manes M.D.   On: 10/26/2021 16:33   DG C-Arm 1-60 Min-No Report  Result Date: 10/26/2021 Fluoroscopy was utilized by the requesting physician.  No radiographic interpretation.   DG C-Arm 1-60 Min-No Report  Result Date: 10/26/2021 Fluoroscopy was utilized by the requesting physician.  No radiographic interpretation.   DG C-Arm 1-60 Min-No Report  Result Date: 10/26/2021 Fluoroscopy was utilized by the requesting physician.  No radiographic interpretation.      Subjective: No dizziness, chest pain or any other symptoms.  Feels better  Discharge Exam: Vitals:   11/12/21 0511 11/12/21 0718  BP: 135/74 (!) 141/66  Pulse: 74 75  Resp: 19 18  Temp: 98 F (36.7 C) 98 F (36.7 C)  SpO2: 96% 93%   Vitals:   11/12/21 0300 11/12/21 0400 11/12/21 0511 11/12/21 0718  BP: (!) 127/47 (!) 144/58 135/74 (!) 141/66  Pulse: 69 65 74 75  Resp: '18 18 19 18  '$ Temp:  97.8 F (36.6 C) 98 F (36.7 C) 98 F (36.7 C)  TempSrc:  Oral Oral   SpO2: 96% 95% 96% 93%  Weight:      Height:        General: Pt is alert, awake, not in acute distress Cardiovascular: RRR, S1/S2 +, no rubs, no gallops Respiratory: CTA bilaterally, no wheezing, no rhonchi Abdominal: Soft, NT, ND, bowel sounds + Extremities: no edema, no cyanosis    The results of significant diagnostics from this hospitalization (including imaging, microbiology, ancillary and laboratory) are listed below for reference.     Microbiology: No results found for this or any previous  visit (from the past 240 hour(s)).   Labs: BNP (last 3 results) No results for input(s): "BNP" in the last 8760 hours. Basic Metabolic Panel: Recent Labs  Lab 11/10/21 2016 11/11/21 0341  NA 134* 137  K 3.6 3.9  CL 99 101  CO2 23 26  GLUCOSE 170* 149*  BUN 22 17  CREATININE 0.84 0.66  CALCIUM 9.2 9.4  MG 1.9 2.2   Liver Function Tests: Recent Labs  Lab 11/10/21 2016  AST 18  ALT 28  ALKPHOS 89  BILITOT 0.8  PROT 7.3  ALBUMIN 3.6   No results for input(s): "LIPASE", "AMYLASE" in the last 168 hours. No results for input(s): "AMMONIA" in the last 168 hours. CBC: Recent Labs  Lab 11/10/21 2016 11/11/21 0341  WBC 10.7* 9.5  NEUTROABS 8.3*  --   HGB 11.5* 11.2*  HCT 36.3* 35.2*  MCV 88.5 88.0  PLT 555* 544*   Cardiac Enzymes: No results for input(s): "CKTOTAL", "CKMB", "CKMBINDEX", "TROPONINI" in the last 168 hours. BNP: Invalid input(s): "POCBNP" CBG: Recent Labs  Lab 11/11/21 1618 11/11/21 2145 11/11/21 2337 11/12/21 0427 11/12/21 0720  GLUCAP 180* 167* 126* 147* 212*   D-Dimer Recent Labs    11/10/21 2157  DDIMER 1.90*   Hgb A1c Recent Labs    11/11/21 1537  HGBA1C 6.8*   Lipid Profile No results for input(s): "CHOL", "HDL", "LDLCALC", "TRIG", "CHOLHDL", "LDLDIRECT" in the last 72 hours. Thyroid function studies No results for input(s): "TSH", "T4TOTAL", "T3FREE", "THYROIDAB" in the last 72 hours.  Invalid input(s): "FREET3" Anemia work up No results for  input(s): "VITAMINB12", "FOLATE", "FERRITIN", "TIBC", "IRON", "RETICCTPCT" in the last 72 hours. Urinalysis    Component Value Date/Time   COLORURINE YELLOW (A) 10/15/2021 1104   APPEARANCEUR CLEAR (A) 10/15/2021 1104   LABSPEC 1.021 10/15/2021 1104   PHURINE 5.0 10/15/2021 1104   GLUCOSEU >=500 (A) 10/15/2021 1104   HGBUR NEGATIVE 10/15/2021 1104   BILIRUBINUR NEGATIVE 10/15/2021 1104   KETONESUR NEGATIVE 10/15/2021 1104   PROTEINUR NEGATIVE 10/15/2021 1104   NITRITE NEGATIVE  10/15/2021 1104   LEUKOCYTESUR NEGATIVE 10/15/2021 1104   Sepsis Labs Recent Labs  Lab 11/10/21 2016 11/11/21 0341  WBC 10.7* 9.5   Microbiology No results found for this or any previous visit (from the past 240 hour(s)).   Time coordinating discharge: Over 30 minutes  SIGNED:   Nolberto Hanlon, MD  [facility] 11/12/2021, 10:39 AM Pager   If 7PM-7AM, please contact night-coverage [email or web address] Password [email or web address]

## 2021-11-13 ENCOUNTER — Telehealth: Payer: Self-pay

## 2021-11-13 NOTE — Telephone Encounter (Signed)
Left detailed message on Dean Simpson's secure voice mail.

## 2021-11-13 NOTE — Telephone Encounter (Signed)
-----   Message from Peggyann Shoals sent at 11/13/2021  9:56 AM EDT ----- Regarding: Ratt[place] verbal order Contact: [phone] L4-S1 TLIF on 10/26/2021 xrays today Raquel Sarna from [facility]  Verbal order for PT 2 x 2

## 2021-11-13 NOTE — Telephone Encounter (Signed)
This is fine. He was already set up with them prior to being in the hospital the other day. Thanks

## 2021-11-17 DIAGNOSIS — I4729 Other ventricular tachycardia: Secondary | ICD-10-CM

## 2021-11-17 HISTORY — DX: Other ventricular tachycardia: I47.29

## 2021-11-26 ENCOUNTER — Telehealth: Payer: Self-pay

## 2021-11-26 NOTE — Telephone Encounter (Signed)
Dean Simpson was notified of verbal PT order.

## 2021-11-26 NOTE — Telephone Encounter (Signed)
Can you let Raquel Sarna know that it is ok to extend PT? Thanks

## 2021-11-26 NOTE — Telephone Encounter (Signed)
-----   Message from Peggyann Shoals sent at 11/26/2021  1:36 PM EDT ----- Regarding: verbal HH order Contact: [phone] L4-S1 TLIF on 10/26/2021  Raquel Sarna from [facility] Requesting PT extension for 2 x 1. This will get him through to his next postop appt and maybe he can get a referral for outpatient PT.

## 2021-12-04 ENCOUNTER — Encounter: Payer: Self-pay | Admitting: Neurosurgery

## 2021-12-07 ENCOUNTER — Other Ambulatory Visit: Payer: Self-pay

## 2021-12-07 DIAGNOSIS — Z981 Arthrodesis status: Secondary | ICD-10-CM

## 2021-12-08 ENCOUNTER — Ambulatory Visit
Admission: RE | Admit: 2021-12-08 | Discharge: 2021-12-08 | Disposition: A | Discharge status: Home / Self Care | Payer: Medicare Other | Source: Ambulatory Visit | Attending: Neurosurgery | Admitting: Neurosurgery

## 2021-12-08 ENCOUNTER — Encounter: Payer: Self-pay | Admitting: Neurosurgery

## 2021-12-08 ENCOUNTER — Ambulatory Visit
Admission: RE | Admit: 2021-12-08 | Discharge: 2021-12-08 | Disposition: A | Discharge status: Home / Self Care | Payer: Medicare Other | Source: Ambulatory Visit | Attending: Internal Medicine | Admitting: Internal Medicine

## 2021-12-08 ENCOUNTER — Ambulatory Visit (INDEPENDENT_AMBULATORY_CARE_PROVIDER_SITE_OTHER): Payer: Medicare Other | Admitting: Neurosurgery

## 2021-12-08 VITALS — BP 114/60 | HR 88 | Temp 97.8°F | Wt 215.2 lb

## 2021-12-08 DIAGNOSIS — Z09 Encounter for follow-up examination after completed treatment for conditions other than malignant neoplasm: Secondary | ICD-10-CM

## 2021-12-08 DIAGNOSIS — Z981 Arthrodesis status: Secondary | ICD-10-CM

## 2021-12-08 DIAGNOSIS — M5416 Radiculopathy, lumbar region: Secondary | ICD-10-CM

## 2021-12-08 DIAGNOSIS — M4310 Spondylolisthesis, site unspecified: Secondary | ICD-10-CM

## 2021-12-08 NOTE — Progress Notes (Signed)
   REFERRING PHYSICIAN:  Meade Maw, [place] [address] [place],  [place] [address]  DOS: 10/26/21 open L4-S1 TLIF   HISTORY OF PRESENT ILLNESS: Dean Simpson is status post lumbar fusion.   Still has some pain in the back of his left leg, but it is improving.  His pain is much better than it was prior to surgery.  PHYSICAL EXAMINATION:  General: Patient is well developed, well nourished, calm, collected, and in no apparent distress.   NEUROLOGICAL:  General: In no acute distress.   Awake, alert, oriented to person, place, and time.  Pupils equal round and reactive to light.  Facial tone is symmetric.  Tongue protrusion is midline.  There is no pronator drift.   Strength:            Side Iliopsoas Quads Hamstring PF DF EHL  R '5 5 5 5 5 5  '$ L '5 5 5 5 5 5   '$ Incision c/d/i   ROS (Neurologic):  Negative except as noted above  IMAGING: No complications noted  ASSESSMENT/PLAN:  Dean Simpson is doing well  after lumbar fusion.  I think his pain will continue to improve.  I think he is ready for outpatient physical therapy.    See him back in 6 weeks with x-rays.    Meade Maw MD Department of neurosurgery

## 2021-12-08 NOTE — Addendum Note (Signed)
Addended by: Meade Maw on: 12/08/2021 03:06 PM   Modules accepted: Orders

## 2021-12-28 ENCOUNTER — Ambulatory Visit: Payer: Medicare Other | Attending: Neurosurgery | Admitting: Physical Therapy

## 2021-12-28 DIAGNOSIS — M5459 Other low back pain: Secondary | ICD-10-CM | POA: Insufficient documentation

## 2021-12-28 DIAGNOSIS — Z981 Arthrodesis status: Secondary | ICD-10-CM | POA: Diagnosis not present

## 2021-12-28 NOTE — Therapy (Signed)
OUTPATIENT PHYSICAL THERAPY THORACOLUMBAR EVALUATION   Patient Name: Dean Simpson MRN: [id number] DOB:[birth year], 71 y.o., male Today's Date: 12/30/2021    Past Medical History:  Diagnosis Date   Arthritis    BPH (benign prostatic hyperplasia)    COPD (chronic obstructive pulmonary disease) ([place])    Diabetes mellitus without complication (HCC)    Dyspnea    with exertion   Hemorrhoids    History of tobacco abuse    Hx of colonic polyps    Hyperlipidemia    Hypertension    Microscopic hematuria    Obstructive emphysema (HCC)    Proteinuria 02/25/2014   Psoriasis    Right ovarian cyst    Sleep apnea    uses Cpap   Past Surgical History:  Procedure Laterality Date   APPLICATION OF INTRAOPERATIVE CT SCAN N/A 10/26/2021   Procedure: APPLICATION OF INTRAOPERATIVE CT SCAN;  Surgeon: Meade Maw, MD;  Location: [facility] ORS;  Service: Neurosurgery;  Laterality: N/A;   COLONOSCOPY WITH PROPOFOL N/A 09/23/2014   Procedure: COLONOSCOPY WITH PROPOFOL;  Surgeon: Hulen Luster, MD;  Location: [facility] ENDOSCOPY;  Service: Gastroenterology;  Laterality: N/A;   COLONOSCOPY WITH PROPOFOL N/A 03/27/2018   Procedure: COLONOSCOPY WITH PROPOFOL;  Surgeon: Manya Silvas, MD;  Location: [facility] ENDOSCOPY;  Service: Endoscopy;  Laterality: N/A;   TOOTH EXTRACTION     TRANSFORAMINAL LUMBAR INTERBODY FUSION (TLIF) WITH PEDICLE SCREW FIXATION 2 LEVEL N/A 10/26/2021   Procedure: OPEN L4-S1 TRANSFORAMINAL LUMBAR INTERBODY FUSION (TLIF);  Surgeon: Meade Maw, MD;  Location: [facility] ORS;  Service: Neurosurgery;  Laterality: N/A;   Patient Active Problem List   Diagnosis Date Noted   Dizziness 11/10/2021   Hypertensive urgency 11/10/2021   COPD (chronic obstructive pulmonary disease) (HCC)    BPH (benign prostatic hyperplasia)    Diabetes mellitus without complication (HCC)    OSA on CPAP with O2 bleed in    Obesity (BMI 30-39.9)    Frequent PVCs    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  10/26/2021   Type 2 diabetes mellitus with microalbuminuria, without long-term current use of insulin ([place]) 09/17/2013    PCP: Ramonita Lab MD  REFERRING PROVIDER: Meade Maw MD  REFERRING DIAG: L4-S1 TLIF July 10th 2023  Rationale for Evaluation and Treatment Rehabilitation  THERAPY DIAG:  Other low back pain - Plan: PT plan of care cert/re-cert  ONSET DATE: July 10th 2023  SUBJECTIVE:                                                                                                                                                                                           SUBJECTIVE STATEMENT: L4-S1  TLIF July 10th 2023 PERTINENT HISTORY:  Pt is a 71 year old male s/p open L4-S1 TLIF July 10th 2023 for radicular LBP. Reports he is doing much better since. Has some remaining L sided posterior leg burning pain, no back pain currently. Has continued LLE pain with ambulating >35mns. Has a quad cane since surgery, would like to not have to use it. Reports no falls in the past 6 months, but reports he feels unsteady on his feet with walking on level surfaces, or navigating stairs. Has 4 steps to enter his  home without handrails. Pt reports per MD no bending, twisting or lifting, or lifting >40lbs. Pt has a back brace he does not have to use anymore. Pt denies N/V, B&B changes, unexplained weight fluctuation, saddle paresthesia, fever, night sweats, or unrelenting night pain at this time. No falls in past 6 months   PAIN:  Are you having pain? Yes: NPRS scale: 2/10 Pain location: Post LE all the way to the ankle Pain description: burning, sharp Aggravating factors: walking, forward bending Relieving factors: rest   PRECAUTIONS: Fall  WEIGHT BEARING RESTRICTIONS No  FALLS:  Has patient fallen in last 6 months? 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  PLOF: Independent  PATIENT GOALS walk without cane   OBJECTIVE:   DIAGNOSTIC FINDINGS:  Xray 12/09/21 Posterior fusion changes from L4 through S1 without acute osseous abnormality  PATIENT SURVEYS:  FOTO 47 goal 65  SCREENING FOR RED FLAGS: Bowel or bladder incontinence: No Spinal tumors: No Cauda equina syndrome: No Compression fracture: No Abdominal aneurysm: No  COGNITION:  Overall cognitive status: Within functional limits for tasks assessed     SENSATION: WFL  MUSCLE LENGTH: Hamstrings: Right 110 deg; Left 100 deg Thomas test: Shortened bilat Elys test shortened bilat  POSTURE: rounded shoulders, forward head, decreased lumbar lordosis, increased thoracic kyphosis, flexed trunk , and weight shift right  PALPATION: TTP at R lumbar paraspinals, QL and superior glute/glute med fibers  LUMBAR ROM:   Active  A/PROM  eval  Flexion 50% with increased thoracic kyphosis  Extension 25% limited  Right lateral flexion WNL  Left lateral flexion WNL  Right rotation 50% limited  Left rotation 50% limited  No overpressure d/t surgical precautions   (Blank rows = not tested)  LOWER EXTREMITY ROM:     Active  Right eval Left eval  Hip flexion 110 95  Hip extension ~10d 0d  Hip abduction 25% limited 50% limited  Hip adduction    Hip internal rotation ~10d 0d  Hip external rotation 50% limited 50% limited  Knee flexion wnl wnl  Knee extension wnl wnl  Ankle dorsiflexion WNL WNL  Ankle plantarflexion    Ankle inversion    Ankle eversion     (Blank rows = not tested)  Passive mobility = active EXCEPT hip ext is passively 10d bilat  LOWER EXTREMITY MMT:    MMT Right eval Left eval  Hip flexion 4 4-  Hip extension 3+ 3  Hip abduction 4 3+  Hip adduction    Hip internal rotation 4+ 4-  Hip external rotation 4 3+  Knee  flexion 5 5  Knee extension 4+ 4+  Ankle dorsiflexion 4+ 4+  Ankle plantarflexion    Ankle  inversion    Ankle eversion     (Blank rows = not tested) MMTs graded within available ROM 1RM leg press L: 15# R: 55#  LUMBAR SPECIAL TESTS:  Slump test: Negative, FABER test: Positive, and Thomas test: Positive  FUNCTIONAL TESTS:  5 times sit to stand: 13sec 30 seconds chair stand test 10  GAIT: Distance walked: 4mAssistive device utilized: Quad cane small base Level of assistance: Modified independence Comments: Decreased terminal stance bilat R> L d/t decreased hip ext, R trunk lean and wt shift with QC in R hand, slow speed: 0.581m    TODAY'S TREATMENT  PT reviewed the following HEP with patient with patient able to demonstrate a set of the following with min cuing for correction needed. PT educated patient on parameters of therex (how/when to inc/decrease intensity, frequency, rep/set range, stretch hold time, and purpose of therex) with verbalized understanding.     PATIENT EDUCATION:  Education details: Patient was educated on diagnosis, anatomy and pathology involved, prognosis, role of PT, and was given an HEP, demonstrating exercise with proper form following verbal and tactile cues, and was given a paper hand out to continue exercise at home. Pt was educated on and agreed to plan of care.  Person educated: Patient Education method: Explanation, Demonstration, and Handouts Education comprehension: verbalized understanding, returned demonstration, and tactile cues required   HOME EXERCISE PROGRAM: Access Code: [email or web address]: [email or web address] Date: 12/30/2021 Prepared by: ChDurwin RegesExercises - Sit to Stand Without Arm Support  - 2-3 x weekly - 3 sets - 6-12 reps - Seated Hamstring Stretch  - 1-3 x daily - 7 x weekly - 30-60sec hold  ASSESSMENT:  CLINICAL IMPRESSION: Patient is a [age].o. male who was seen today for physical therapy  evaluation and treatment s/p L4-S1 TLIF July 10th 2023. Impairments in increased muscle tone (esp hamstrings and iliopsoas inhiiting TKE and hip ext), bilat hip strength LLE  > RLE, decreased cire strength, decreased hip mobility bilat L>R, decreased static and dynamic balance, abnormal gait and posture, decreased gait speed, and pain. Activity limitations in community ambulation level, stair negotiation, lifting, carrying, and walking on uneven or changing surfaces; inhibiting participation in community and household ADLs. Would benefit from skilled PT to address above deficits and promote optimal return to PLOF.    OBJECTIVE IMPAIRMENTS Abnormal gait, decreased activity tolerance, decreased balance, decreased endurance, decreased knowledge of use of DME, decreased mobility, difficulty walking, decreased ROM, decreased strength, decreased safety awareness, increased fascial restrictions, impaired flexibility, impaired tone, improper body mechanics, postural dysfunction, and pain.   ACTIVITY LIMITATIONS carrying, lifting, bending, squatting, stairs, transfers, bathing, and locomotion level  PARTICIPATION LIMITATIONS: meal prep, cleaning, laundry, community activity, occupation, and yard work  PERSONAL FACTORS Age, Fitness, Past/current experiences, Time since onset of injury/illness/exacerbation, and 3+ comorbidities: HTN, HLD, COPD, DM2  are also affecting patient's functional outcome.   REHAB POTENTIAL: Good  CLINICAL DECISION MAKING: Evolving/moderate complexity  EVALUATION COMPLEXITY: Moderate   GOALS: Goals reviewed with patient? Yes  SHORT TERM GOALS: Target date: 01/27/2022  Pt will be independent with HEP in order to improve strength and balance in order to decrease fall risk and improve function at home and work. Baseline: HEP given  Goal status: INITIAL   LONG TERM GOALS: Target date: 02/24/2022  Pt will increase 10MWT to at least 1.0 m/s in order to demonstrate clinically  significant decreased fall risk in community ambulation.  Baseline: 0.5969mGoal status: INITIAL  2.  Pt will decrease 5TSTS  by at least 3 seconds in order to demonstrate clinically significant improvement in LE strength Baseline:  Goal status: INITIAL  3.  Pt will demonstrate 1 RM leg press of LLE of at least 50# in order to demonstrate 90% of RLE in order to complete household ADLs with symmetrical, efficient movement/load Baseline: R: 55# L: 15# Goal status: INITIAL  4.  Patient will increase FOTO score to 56  to demonstrate predicted increase in functional mobility to complete ADLs Baseline: 49 Goal status: INITIAL  5.  PT will demonstrate SLS of at least 10sec bilat to demonstrate decreased fall risk and increased unipedal stability for reactionary balance Baseline: R: 4sec L 2sec  Goal status:     PLAN: PT FREQUENCY: 1-2x/week  PT DURATION: 8 weeks  PLANNED INTERVENTIONS: Therapeutic exercises, Therapeutic activity, Neuromuscular re-education, Balance training, Gait training, Patient/Family education, Self Care, Joint mobilization, Joint manipulation, Stair training, DME instructions, Dry Needling, Electrical stimulation, Spinal manipulation, Spinal mobilization, Cryotherapy, Moist heat, Taping, Traction, Ultrasound, Manual therapy, and Re-evaluation.  PLAN FOR NEXT SESSION: HEP review/update   Durwin Reges DPT  Durwin Reges, PT 12/30/2021, 10:40 AM

## 2021-12-30 ENCOUNTER — Encounter: Payer: Self-pay | Admitting: Physical Therapy

## 2021-12-30 ENCOUNTER — Ambulatory Visit: Payer: Medicare Other | Admitting: Physical Therapy

## 2021-12-30 DIAGNOSIS — M5459 Other low back pain: Secondary | ICD-10-CM

## 2021-12-30 NOTE — Therapy (Signed)
OUTPATIENT PHYSICAL THERAPY THORACOLUMBAR EVALUATION   Patient Name: Dean Simpson MRN: [id number] DOB:1950/04/24, 71 y.o., male Today's Date: 12/30/2021   PT End of Session - 12/30/21 1109     Visit Number 2    Number of Visits 17    Date for PT Re-Evaluation 03/22/22    Authorization - Visit Number 2    Authorization - Number of Visits 10    PT Start Time [phone]    PT Stop Time 0915    PT Time Calculation (min) 38 min    Activity Tolerance Patient tolerated treatment well    Behavior During Therapy WFL for tasks assessed/performed             Past Medical History:  Diagnosis Date   Arthritis    BPH (benign prostatic hyperplasia)    COPD (chronic obstructive pulmonary disease) ([place])    Diabetes mellitus without complication ([place])    Dyspnea    with exertion   Hemorrhoids    History of tobacco abuse    Hx of colonic polyps    Hyperlipidemia    Hypertension    Microscopic hematuria    Obstructive emphysema (HCC)    Proteinuria 02/25/2014   Psoriasis    Right ovarian cyst    Sleep apnea    uses Cpap   Past Surgical History:  Procedure Laterality Date   APPLICATION OF INTRAOPERATIVE CT SCAN N/A 10/26/2021   Procedure: APPLICATION OF INTRAOPERATIVE CT SCAN;  Surgeon: Meade Maw, MD;  Location: [facility] ORS;  Service: Neurosurgery;  Laterality: N/A;   COLONOSCOPY WITH PROPOFOL N/A 09/23/2014   Procedure: COLONOSCOPY WITH PROPOFOL;  Surgeon: Hulen Luster, MD;  Location: [facility] ENDOSCOPY;  Service: Gastroenterology;  Laterality: N/A;   COLONOSCOPY WITH PROPOFOL N/A 03/27/2018   Procedure: COLONOSCOPY WITH PROPOFOL;  Surgeon: Manya Silvas, MD;  Location: [facility] ENDOSCOPY;  Service: Endoscopy;  Laterality: N/A;   TOOTH EXTRACTION     TRANSFORAMINAL LUMBAR INTERBODY FUSION (TLIF) WITH PEDICLE SCREW FIXATION 2 LEVEL N/A 10/26/2021   Procedure: OPEN L4-S1 TRANSFORAMINAL LUMBAR INTERBODY FUSION (TLIF);  Surgeon: Meade Maw, MD;  Location: [facility] ORS;   Service: Neurosurgery;  Laterality: N/A;   Patient Active Problem List   Diagnosis Date Noted   Dizziness 11/10/2021   Hypertensive urgency 11/10/2021   COPD (chronic obstructive pulmonary disease) (HCC)    BPH (benign prostatic hyperplasia)    Diabetes mellitus without complication (HCC)    OSA on CPAP with O2 bleed in    Obesity (BMI 30-39.9)    Frequent PVCs    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 10/26/2021   Type 2 diabetes mellitus with microalbuminuria, without long-term current use of insulin ([place]) 09/17/2013    PCP: Ramonita Lab MD  REFERRING PROVIDER: Meade Maw MD  REFERRING DIAG: L4-S1 TLIF July 10th 2023  Rationale for Evaluation and Treatment Rehabilitation  THERAPY DIAG:  Other low back pain  ONSET DATE: July 10th 2023  SUBJECTIVE:  SUBJECTIVE STATEMENT: Pt reports no pain this am. Reports compliance with HEP No falls since last visit  PERTINENT HISTORY:  Pt is a 71 year old male s/p open L4-S1 TLIF July 10th 2023 for radicular LBP. Reports he is doing much better since. Has some remaining L sided posterior leg burning pain, no back pain currently. Has continued LLE pain with ambulating >34mns. Has a quad cane since surgery, would like to not have to use it. Reports no falls in the past 6 months, but reports he feels unsteady on his feet with walking on level surfaces, or navigating stairs. Has 4 steps to enter his  home without handrails. Pt reports per MD no bending, twisting or lifting, or lifting >40lbs. Pt has a back brace he does not have to use anymore. Pt denies N/V, B&B changes, unexplained weight fluctuation, saddle paresthesia, fever, night sweats, or unrelenting night pain at this time. No falls in past 6 months   PAIN:  Are you having pain? Yes: NPRS scale: 2/10 Pain  location: Post LE all the way to the ankle Pain description: burning, sharp Aggravating factors: walking, forward bending Relieving factors: rest   PRECAUTIONS: Fall  WEIGHT BEARING RESTRICTIONS No  FALLS:  Has patient fallen in last 6 months? 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  PLOF: Independent  PATIENT GOALS walk without cane   OBJECTIVE:   DIAGNOSTIC FINDINGS:  Xray 12/09/21 Posterior fusion changes from L4 through S1 without acute osseous abnormality  PATIENT SURVEYS:  FOTO 47 goal 65  SCREENING FOR RED FLAGS: Bowel or bladder incontinence: No Spinal tumors: No Cauda equina syndrome: No Compression fracture: No Abdominal aneurysm: No  COGNITION:  Overall cognitive status: Within functional limits for tasks assessed     SENSATION: WFL  MUSCLE LENGTH: Hamstrings: Right 110 deg; Left 100 deg Thomas test: Shortened bilat Elys test shortened bilat  POSTURE: rounded shoulders, forward head, decreased lumbar lordosis, increased thoracic kyphosis, flexed trunk , and weight shift right  PALPATION: TTP at R lumbar paraspinals, QL and superior glute/glute med fibers  LUMBAR ROM:   Active  A/PROM  eval  Flexion 50% with increased thoracic kyphosis  Extension 25% limited  Right lateral flexion WNL  Left lateral flexion WNL  Right rotation 50% limited  Left rotation 50% limited  No overpressure d/t surgical precautions   (Blank rows = not tested)  LOWER EXTREMITY ROM:     Active  Right eval Left eval  Hip flexion 110 95  Hip extension ~10d 0d  Hip abduction 25% limited 50% limited  Hip adduction    Hip internal rotation ~10d 0d  Hip external rotation 50% limited 50% limited  Knee  flexion wnl wnl  Knee extension wnl wnl  Ankle dorsiflexion WNL WNL  Ankle plantarflexion    Ankle inversion    Ankle eversion     (Blank rows = not tested)  Passive mobility = active EXCEPT hip ext is passively 10d bilat  LOWER EXTREMITY MMT:    MMT Right eval Left eval  Hip flexion 4 4-  Hip extension 3+ 3  Hip abduction 4 3+  Hip adduction    Hip internal rotation 4+ 4-  Hip external rotation 4 3+  Knee flexion 5 5  Knee extension  4+ 4+  Ankle dorsiflexion 4+ 4+  Ankle plantarflexion    Ankle inversion    Ankle eversion     (Blank rows = not tested) MMTs graded within available ROM 1RM leg press L: 15# R: 55#  LUMBAR SPECIAL TESTS:  Slump test: Negative, FABER test: Positive, and Thomas test: Positive  FUNCTIONAL TESTS:  5 times sit to stand: 13sec 30 seconds chair stand test 10  GAIT: Distance walked: 259mAssistive device utilized: Quad cane small base Level of assistance: Modified independence Comments: Decreased terminal stance bilat R> L d/t decreased hip ext, R trunk lean and wt shift with QC in R hand, slow speed: 0.570m    TODAY'S TREATMENT  Ther-Ex Nustep seat UE 11 L2 59m71m for gentle motion and strengthening  STS 3x 6 with cuing needed for trunk upright with decent carry over; pt with heavy R trunk lean + wt shift, able to be cued out of 50%   Bridge 2x 10 with max cuing needed for hip ext without excessive lumbar ext compensation   SLS: R 4sec L 2sec  Attempted standard lunge with unilateral HHA with sequencing difficult for patient  OMEGA leg press LLE only 15# 2x 8 attempted heavier wt, unable; difficulty with TKE  Hamstring stretch 30sec   PT reviewed the following HEP with patient with patient able to demonstrate a set of the following with min cuing for correction needed. PT educated patient on parameters of therex (how/when to inc/decrease intensity, frequency, rep/set range, stretch hold time, and purpose of therex) with verbalized  understanding.  Access Code: [email or web address] to Stand Without Arm Support  - 2-3 x weekly - 3 sets - 6-12 reps - Supine Bridge  - 1 x daily - 2-3 x weekly - 3 sets - 6-12 reps - Standing Single Leg Stance with Counter Support  - 1-3 x daily - 7 x weekly - 10sec hold - Seated Hamstring Stretch  - 1-3 x daily - 7 x weekly - 30-60sec hold    PATIENT EDUCATION:  Education details: Patient was educated on diagnosis, anatomy and pathology involved, prognosis, role of PT, and was given an HEP, demonstrating exercise with proper form following verbal and tactile cues, and was given a paper hand out to continue exercise at home. Pt was educated on and agreed to plan of care.  Person educated: Patient Education method: Explanation, Demonstration, and Handouts Education comprehension: verbalized understanding, returned demonstration, and tactile cues required   HOME EXERCISE PROGRAM: [email or web address]:  CLINICAL IMPRESSION: PT initiated therex progression for increased hip ext mobility and strength with LLE focus, and balance with success. Patient is able to comply with all cuing for proper technique of therex with good effort throughout session. Patient with evident decreased LLE strength/activation. PT will continue progression as able.     OBJECTIVE IMPAIRMENTS Abnormal gait, decreased activity tolerance, decreased balance, decreased endurance, decreased knowledge of use of DME, decreased mobility, difficulty walking, decreased ROM, decreased strength, decreased safety awareness, increased fascial restrictions, impaired flexibility, impaired tone, improper body mechanics, postural dysfunction, and pain.   ACTIVITY LIMITATIONS carrying, lifting, bending, squatting, stairs, transfers, bathing, and locomotion level  PARTICIPATION LIMITATIONS: meal prep, cleaning, laundry, community activity, occupation, and yard work  PERSONAL FACTORS Age, Fitness, Past/current experiences, Time since onset  of injury/illness/exacerbation, and 3+ comorbidities: HTN, HLD, COPD, DM2  are also affecting patient's functional outcome.   REHAB POTENTIAL: Good  CLINICAL DECISION MAKING: Evolving/moderate complexity  EVALUATION COMPLEXITY: Moderate   GOALS: Goals  reviewed with patient? Yes  SHORT TERM GOALS: Target date: 01/27/2022  Pt will be independent with HEP in order to improve strength and balance in order to decrease fall risk and improve function at home and work. Baseline: HEP given  Goal status: INITIAL   LONG TERM GOALS: Target date: 02/24/2022  Pt will increase 10MWT to at least 1.0 m/s in order to demonstrate clinically significant decreased fall risk in community ambulation.  Baseline: 0.44ms Goal status: INITIAL  2.  Pt will decrease 5TSTS by at least 3 seconds in order to demonstrate clinically significant improvement in LE strength Baseline:  Goal status: INITIAL  3.  Pt will demonstrate 1 RM leg press of LLE of at least 50# in order to demonstrate 90% of RLE in order to complete household ADLs with symmetrical, efficient movement/load Baseline: R: 55# L: 15# Goal status: INITIAL  4.  Patient will increase FOTO score to 56  to demonstrate predicted increase in functional mobility to complete ADLs Baseline: 49 Goal status: INITIAL  5.  PT will demonstrate SLS of at least 10sec bilat to demonstrate decreased fall risk and increased unipedal stability for reactionary balance Baseline: R: 4sec L 2sec  Goal status:     PLAN: PT FREQUENCY: 1-2x/week  PT DURATION: 8 weeks  PLANNED INTERVENTIONS: Therapeutic exercises, Therapeutic activity, Neuromuscular re-education, Balance training, Gait training, Patient/Family education, Self Care, Joint mobilization, Joint manipulation, Stair training, DME instructions, Dry Needling, Electrical stimulation, Spinal manipulation, Spinal mobilization, Cryotherapy, Moist heat, Taping, Traction, Ultrasound, Manual therapy, and  Re-evaluation.  PLAN FOR NEXT SESSION: HEP review/update   CDurwin RegesDPT  CDurwin Reges PT 12/30/2021, 11:22 AM

## 2022-01-04 ENCOUNTER — Encounter: Payer: Self-pay | Admitting: Physical Therapy

## 2022-01-04 ENCOUNTER — Ambulatory Visit: Payer: Medicare Other | Admitting: Physical Therapy

## 2022-01-04 DIAGNOSIS — M5459 Other low back pain: Secondary | ICD-10-CM | POA: Diagnosis not present

## 2022-01-04 NOTE — Therapy (Signed)
OUTPATIENT PHYSICAL THERAPY THORACOLUMBAR EVALUATION   Patient Name: Dean Simpson MRN: [id number] DOB:05-31-1950, 71 y.o., male Today's Date: 01/04/2022   PT End of Session - 01/04/22 0856     Visit Number 3    Number of Visits 17    Date for PT Re-Evaluation 03/22/22    Authorization - Visit Number 3    Authorization - Number of Visits 10    PT Start Time [phone]    PT Stop Time 0913    PT Time Calculation (min) 39 min    Activity Tolerance Patient tolerated treatment well    Behavior During Therapy WFL for tasks assessed/performed              Past Medical History:  Diagnosis Date   Arthritis    BPH (benign prostatic hyperplasia)    COPD (chronic obstructive pulmonary disease) ([place])    Diabetes mellitus without complication ([place])    Dyspnea    with exertion   Hemorrhoids    History of tobacco abuse    Hx of colonic polyps    Hyperlipidemia    Hypertension    Microscopic hematuria    Obstructive emphysema (H[facility])    Proteinuria 02/25/2014   Psoriasis    Right ovarian cyst    Sleep apnea    uses Cpap   Past Surgical History:  Procedure Laterality Date   APPLICATION OF INTRAOPERATIVE CT SCAN N/A 10/26/2021   Procedure: APPLICATION OF INTRAOPERATIVE CT SCAN;  Surgeon: Meade Maw, MD;  Location: [facility] ORS;  Service: Neurosurgery;  Laterality: N/A;   COLONOSCOPY WITH PROPOFOL N/A 09/23/2014   Procedure: COLONOSCOPY WITH PROPOFOL;  Surgeon: Hulen Luster, MD;  Location: [facility] ENDOSCOPY;  Service: Gastroenterology;  Laterality: N/A;   COLONOSCOPY WITH PROPOFOL N/A 03/27/2018   Procedure: COLONOSCOPY WITH PROPOFOL;  Surgeon: Manya Silvas, MD;  Location: [facility] ENDOSCOPY;  Service: Endoscopy;  Laterality: N/A;   TOOTH EXTRACTION     TRANSFORAMINAL LUMBAR INTERBODY FUSION (TLIF) WITH PEDICLE SCREW FIXATION 2 LEVEL N/A 10/26/2021   Procedure: OPEN L4-S1 TRANSFORAMINAL LUMBAR INTERBODY FUSION (TLIF);  Surgeon: Meade Maw, MD;  Location: [facility] ORS;   Service: Neurosurgery;  Laterality: N/A;   Patient Active Problem List   Diagnosis Date Noted   Dizziness 11/10/2021   Hypertensive urgency 11/10/2021   COPD (chronic obstructive pulmonary disease) (HCC)    BPH (benign prostatic hyperplasia)    Diabetes mellitus without complication (HCC)    OSA on CPAP with O2 bleed in    Obesity (BMI 30-39.9)    Frequent PVCs    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 10/26/2021   Type 2 diabetes mellitus with microalbuminuria, without long-term current use of insulin ([place]) 09/17/2013    PCP: Ramonita Lab MD  REFERRING PROVIDER: Meade Maw MD  REFERRING DIAG: L4-S1 TLIF July 10th 2023  Rationale for Evaluation and Treatment Rehabilitation  THERAPY DIAG:  Other low back pain  ONSET DATE: July 10th 2023  SUBJECTIVE:  SUBJECTIVE STATEMENT: Pt reports no pain this am. Pt reports some muscle soreness following last visit. No falls since last visit. Reports compliance with HEP  PERTINENT HISTORY:  Pt is a 71 year old male s/p open L4-S1 TLIF July 10th 2023 for radicular LBP. Reports he is doing much better since. Has some remaining L sided posterior leg burning pain, no back pain currently. Has continued LLE pain with ambulating >34mns. Has a quad cane since surgery, would like to not have to use it. Reports no falls in the past 6 months, but reports he feels unsteady on his feet with walking on level surfaces, or navigating stairs. Has 4 steps to enter his  home without handrails. Pt reports per MD no bending, twisting or lifting, or lifting >40lbs. Pt has a back brace he does not have to use anymore. Pt denies N/V, B&B changes, unexplained weight fluctuation, saddle paresthesia, fever, night sweats, or unrelenting night pain at this time. No falls in past 6 months   PAIN:   Are you having pain? Yes: NPRS scale: 2/10 Pain location: Post LE all the way to the ankle Pain description: burning, sharp Aggravating factors: walking, forward bending Relieving factors: rest   PRECAUTIONS: Fall  WEIGHT BEARING RESTRICTIONS No  FALLS:  Has patient fallen in last 6 months? 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  PLOF: Independent  PATIENT GOALS walk without cane   OBJECTIVE:   DIAGNOSTIC FINDINGS:  Xray 12/09/21 Posterior fusion changes from L4 through S1 without acute osseous abnormality  PATIENT SURVEYS:  FOTO 47 goal 65  SCREENING FOR RED FLAGS: Bowel or bladder incontinence: No Spinal tumors: No Cauda equina syndrome: No Compression fracture: No Abdominal aneurysm: No  COGNITION:  Overall cognitive status: Within functional limits for tasks assessed     SENSATION: WFL  MUSCLE LENGTH: Hamstrings: Right 110 deg; Left 100 deg Thomas test: Shortened bilat Elys test shortened bilat  POSTURE: rounded shoulders, forward head, decreased lumbar lordosis, increased thoracic kyphosis, flexed trunk , and weight shift right  PALPATION: TTP at R lumbar paraspinals, QL and superior glute/glute med fibers  LUMBAR ROM:   Active  A/PROM  eval  Flexion 50% with increased thoracic kyphosis  Extension 25% limited  Right lateral flexion WNL  Left lateral flexion WNL  Right rotation 50% limited  Left rotation 50% limited  No overpressure d/t surgical precautions   (Blank rows = not tested)  LOWER EXTREMITY ROM:     Active  Right eval Left eval  Hip flexion 110 95  Hip extension ~10d 0d  Hip abduction 25% limited 50% limited  Hip adduction    Hip internal rotation ~10d 0d  Hip  external rotation 50% limited 50% limited  Knee flexion wnl wnl  Knee extension wnl wnl  Ankle dorsiflexion WNL WNL  Ankle plantarflexion    Ankle inversion    Ankle eversion     (Blank rows = not tested)  Passive mobility = active EXCEPT hip ext is passively 10d bilat  LOWER EXTREMITY MMT:    MMT Right eval Left eval  Hip flexion 4 4-  Hip extension 3+ 3  Hip abduction 4 3+  Hip adduction    Hip internal rotation 4+ 4-  Hip external rotation 4 3+  Knee flexion 5 5  Knee extension 4+ 4+  Ankle dorsiflexion 4+ 4+  Ankle plantarflexion    Ankle inversion    Ankle eversion     (Blank rows = not tested) MMTs graded within available ROM 1RM leg press L: 15# R: 55#  LUMBAR SPECIAL TESTS:  Slump test: Negative, FABER test: Positive, and Thomas test: Positive  FUNCTIONAL TESTS:  5 times sit to stand: 13sec 30 seconds chair stand test 10  GAIT: Distance walked: 52mAssistive device utilized: Quad cane small base Level of assistance: Modified independence Comments: Decreased terminal stance bilat R> L d/t decreased hip ext, R trunk lean and wt shift with QC in R hand, slow speed: 0.46m    TODAY'S TREATMENT  Ther-Ex Nustep seat UE 11 L2 73m10m for gentle motion and strengthening  STS x10; with RLE on foam pad 2x 8 to increase LLE demand with success, cuing for trunk upright  L step up onto 3in step wit unilateral handrail support 2x 10 cuing for eccentric control with decent carry over  Standing hip abd 2x 8 bilat with "light touch" finger support for balance; difficulty maintaining positioning with LLE stance RLE abd  OMEGA leg press LLE only 15# x10; 20# x10 cuing for eccentric control with good carry over   Hamstring stretch 30sec bilat   PATIENT EDUCATION:  Education details: Patient was educated on diagnosis, anatomy and pathology involved, prognosis, role of PT, and was given an HEP, demonstrating exercise with proper form following verbal and tactile cues,  and was given a paper hand out to continue exercise at home. Pt was educated on and agreed to plan of care.  Person educated: Patient Education method: Explanation, Demonstration, and Handouts Education comprehension: verbalized understanding, returned demonstration, and tactile cues required   HOME EXERCISE PROGRAM: [id number]:  CLINICAL IMPRESSION: PT continued therex progression for increased LLE stability and strength with success. Pt with difficulty in LLE stability with RLE movement, evident in gait nd therex. Patient is able to comply with all cuing for proper technique of therex with good effort throughout session. No increased pain throughout session. PT will continue progression as able.     OBJECTIVE IMPAIRMENTS Abnormal gait, decreased activity tolerance, decreased balance, decreased endurance, decreased knowledge of use of DME, decreased mobility, difficulty walking, decreased ROM, decreased strength, decreased safety awareness, increased fascial restrictions, impaired flexibility, impaired tone, improper body mechanics, postural dysfunction, and pain.   ACTIVITY LIMITATIONS carrying, lifting, bending, squatting, stairs, transfers, bathing, and locomotion level  PARTICIPATION LIMITATIONS: meal prep, cleaning, laundry, community activity, occupation, and yard work  PERSONAL FACTORS Age, Fitness, Past/current experiences, Time since onset of injury/illness/exacerbation, and 3+ comorbidities: HTN, HLD, COPD, DM2  are also affecting patient's functional outcome.   REHAB POTENTIAL: Good  CLINICAL DECISION MAKING: Evolving/moderate complexity  EVALUATION COMPLEXITY: Moderate   GOALS: Goals reviewed with patient? Yes  SHORT TERM GOALS: Target date: 02/01/2022  Pt will be independent with HEP in order to improve strength and balance in order to decrease fall risk and improve function at home and work. Baseline: HEP given  Goal status: INITIAL   LONG TERM  GOALS: Target date: 03/01/2022  Pt will increase 10MWT to at least 1.0 m/s in order to demonstrate clinically significant decreased fall risk in community ambulation.  Baseline: 0.46m44moal status: INITIAL  2.  Pt will decrease 5TSTS by at least 3 seconds in order to demonstrate clinically significant improvement in LE strength Baseline:  Goal status: INITIAL  3.  Pt will demonstrate 1 RM leg press of LLE of at least 50# in order to demonstrate 90% of RLE in order to complete household ADLs with symmetrical, efficient movement/load Baseline: R: 55# L: 15# Goal status: INITIAL  4.  Patient will increase FOTO score to 56  to demonstrate predicted increase in functional mobility to complete ADLs Baseline: 49 Goal status: INITIAL  5.  PT will demonstrate SLS of at least 10sec bilat to demonstrate decreased fall risk and increased unipedal stability for reactionary balance Baseline: R: 4sec L 2sec  Goal status:     PLAN: PT FREQUENCY: 1-2x/week  PT DURATION: 8 weeks  PLANNED INTERVENTIONS: Therapeutic exercises, Therapeutic activity, Neuromuscular re-education, Balance training, Gait training, Patient/Family education, Self Care, Joint mobilization, Joint manipulation, Stair training, DME instructions, Dry Needling, Electrical stimulation, Spinal manipulation, Spinal mobilization, Cryotherapy, Moist heat, Taping, Traction, Ultrasound, Manual therapy, and Re-evaluation.  PLAN FOR NEXT SESSION: HEP review/update   Durwin Reges DPT  Durwin Reges, PT 01/04/2022, 9:40 AM

## 2022-01-06 ENCOUNTER — Encounter: Payer: Medicare Other | Admitting: Physical Therapy

## 2022-01-08 ENCOUNTER — Ambulatory Visit: Payer: Medicare Other | Admitting: Physical Therapy

## 2022-01-08 ENCOUNTER — Encounter: Payer: Self-pay | Admitting: Physical Therapy

## 2022-01-08 DIAGNOSIS — M5459 Other low back pain: Secondary | ICD-10-CM

## 2022-01-08 NOTE — Therapy (Signed)
OUTPATIENT PHYSICAL THERAPY THORACOLUMBAR EVALUATION   Patient Name: Dean Simpson MRN: [id number] DOB:Aug 04, 1950, 71 y.o., male Today's Date: 01/08/2022   PT End of Session - 01/08/22 0921     Visit Number 4    Number of Visits 17    Date for PT Re-Evaluation 03/22/22    Authorization - Visit Number 4    Authorization - Number of Visits 10    PT Start Time 0917    PT Stop Time 0955    PT Time Calculation (min) 38 min    Activity Tolerance Patient tolerated treatment well    Behavior During Therapy WFL for tasks assessed/performed               Past Medical History:  Diagnosis Date   Arthritis    BPH (benign prostatic hyperplasia)    COPD (chronic obstructive pulmonary disease) ([place])    Diabetes mellitus without complication ([place])    Dyspnea    with exertion   Hemorrhoids    History of tobacco abuse    Hx of colonic polyps    Hyperlipidemia    Hypertension    Microscopic hematuria    Obstructive emphysema (H[facility])    Proteinuria 02/25/2014   Psoriasis    Right ovarian cyst    Sleep apnea    uses Cpap   Past Surgical History:  Procedure Laterality Date   APPLICATION OF INTRAOPERATIVE CT SCAN N/A 10/26/2021   Procedure: APPLICATION OF INTRAOPERATIVE CT SCAN;  Surgeon: Meade Maw, MD;  Location: [facility] ORS;  Service: Neurosurgery;  Laterality: N/A;   COLONOSCOPY WITH PROPOFOL N/A 09/23/2014   Procedure: COLONOSCOPY WITH PROPOFOL;  Surgeon: Hulen Luster, MD;  Location: [facility] ENDOSCOPY;  Service: Gastroenterology;  Laterality: N/A;   COLONOSCOPY WITH PROPOFOL N/A 03/27/2018   Procedure: COLONOSCOPY WITH PROPOFOL;  Surgeon: Manya Silvas, MD;  Location: [facility] ENDOSCOPY;  Service: Endoscopy;  Laterality: N/A;   TOOTH EXTRACTION     TRANSFORAMINAL LUMBAR INTERBODY FUSION (TLIF) WITH PEDICLE SCREW FIXATION 2 LEVEL N/A 10/26/2021   Procedure: OPEN L4-S1 TRANSFORAMINAL LUMBAR INTERBODY FUSION (TLIF);  Surgeon: Meade Maw, MD;  Location: [facility] ORS;   Service: Neurosurgery;  Laterality: N/A;   Patient Active Problem List   Diagnosis Date Noted   Dizziness 11/10/2021   Hypertensive urgency 11/10/2021   COPD (chronic obstructive pulmonary disease) (HCC)    BPH (benign prostatic hyperplasia)    Diabetes mellitus without complication (H[facility])    OSA on CPAP with O2 bleed in    Obesity (BMI 30-39.9)    Frequent PVCs    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 10/26/2021   Type 2 diabetes mellitus with microalbuminuria, without long-term current use of insulin ([place]) 09/17/2013    PCP: Ramonita Lab MD  REFERRING PROVIDER: Meade Maw MD  REFERRING DIAG: L4-S1 TLIF July 10th 2023  Rationale for Evaluation and Treatment Rehabilitation  THERAPY DIAG:  Other low back pain  ONSET DATE: July 10th 2023  SUBJECTIVE:  SUBJECTIVE STATEMENT: Pt reports no pain this am. Pt reports compliance with HEP. No falls since last visit. He tried walking without his cane yesterday and reports he did well, just a little slow.   PERTINENT HISTORY:  Pt is a 71 year old male s/p open L4-S1 TLIF July 10th 2023 for radicular LBP. Reports he is doing much better since. Has some remaining L sided posterior leg burning pain, no back pain currently. Has continued LLE pain with ambulating >80mns. Has a quad cane since surgery, would like to not have to use it. Reports no falls in the past 6 months, but reports he feels unsteady on his feet with walking on level surfaces, or navigating stairs. Has 4 steps to enter his  home without handrails. Pt reports per MD no bending, twisting or lifting, or lifting >40lbs. Pt has a back brace he does not have to use anymore. Pt denies N/V, B&B changes, unexplained weight fluctuation, saddle paresthesia, fever, night sweats, or unrelenting night pain at this  time. No falls in past 6 months   PAIN:  Are you having pain? Yes: NPRS scale: 2/10 Pain location: Post LE all the way to the ankle Pain description: burning, sharp Aggravating factors: walking, forward bending Relieving factors: rest   PRECAUTIONS: Fall  WEIGHT BEARING RESTRICTIONS No  FALLS:  Has patient fallen in last 6 months? 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  PLOF: Independent  PATIENT GOALS walk without cane   OBJECTIVE:   DIAGNOSTIC FINDINGS:  Xray 12/09/21 Posterior fusion changes from L4 through S1 without acute osseous abnormality  PATIENT SURVEYS:  FOTO 47 goal 65  SCREENING FOR RED FLAGS: Bowel or bladder incontinence: No Spinal tumors: No Cauda equina syndrome: No Compression fracture: No Abdominal aneurysm: No  COGNITION:  Overall cognitive status: Within functional limits for tasks assessed     SENSATION: WFL  MUSCLE LENGTH: Hamstrings: Right 110 deg; Left 100 deg Thomas test: Shortened bilat Elys test shortened bilat  POSTURE: rounded shoulders, forward head, decreased lumbar lordosis, increased thoracic kyphosis, flexed trunk , and weight shift right  PALPATION: TTP at R lumbar paraspinals, QL and superior glute/glute med fibers  LUMBAR ROM:   Active  A/PROM  eval  Flexion 50% with increased thoracic kyphosis  Extension 25% limited  Right lateral flexion WNL  Left lateral flexion WNL  Right rotation 50% limited  Left rotation 50% limited  No overpressure d/t surgical precautions   (Blank rows = not tested)  LOWER EXTREMITY ROM:     Active  Right eval Left eval  Hip flexion 110 95  Hip extension ~10d 0d  Hip abduction 25% limited 50% limited  Hip  adduction    Hip internal rotation ~10d 0d  Hip external rotation 50% limited 50% limited  Knee flexion wnl wnl  Knee extension wnl wnl  Ankle dorsiflexion WNL WNL  Ankle plantarflexion    Ankle inversion    Ankle eversion     (Blank rows = not tested)  Passive mobility = active EXCEPT hip ext is passively 10d bilat  LOWER EXTREMITY MMT:    MMT Right eval Left eval  Hip flexion 4 4-  Hip extension 3+ 3  Hip abduction 4 3+  Hip adduction    Hip  internal rotation 4+ 4-  Hip external rotation 4 3+  Knee flexion 5 5  Knee extension 4+ 4+  Ankle dorsiflexion 4+ 4+  Ankle plantarflexion    Ankle inversion    Ankle eversion     (Blank rows = not tested) MMTs graded within available ROM 1RM leg press L: 15# R: 55#  LUMBAR SPECIAL TESTS:  Slump test: Negative, FABER test: Positive, and Thomas test: Positive  FUNCTIONAL TESTS:  5 times sit to stand: 13sec 30 seconds chair stand test 10  GAIT: Distance walked: 89mAssistive device utilized: Quad cane small base Level of assistance: Modified independence Comments: Decreased terminal stance bilat R> L d/t decreased hip ext, R trunk lean and wt shift with QC in R hand, slow speed: 0.547m    TODAY'S TREATMENT  Ther-Ex Nustep seat UE 11 L2 20m72m for gentle motion and strengthening  STS with RLE on balance stone 3x 10 to increase LLE demand with success, cuing throughout for tecnique  Standing hip abd 2x 10  bilat with "light touch" finger support for balance; difficulty maintaining positioning with LLE stance RLE abd    Dynamic balance with 3# AW on RLE amb with and without cane ([id number] with and without cane) to promote increased RLE stance time/stability with success  OMEGA leg press LLE only 20# x10; 25# 2x 10 cuing for eccentric control with good carry over   Hamstring stretch 30sec bilat   PATIENT EDUCATION:  Education details: Patient was educated on diagnosis, anatomy and pathology involved, prognosis, role of  PT, and was given an HEP, demonstrating exercise with proper form following verbal and tactile cues, and was given a paper hand out to continue exercise at home. Pt was educated on and agreed to plan of care.  Person educated: Patient Education method: Explanation, Demonstration, and Handouts Education comprehension: verbalized understanding, returned demonstration, and tactile cues required   HOME EXERCISE PROGRAM: [id number]:  CLINICAL IMPRESSION: PT continued therex progression for increased LLE stability and strength with success. Pt with difficulty in LLE stability with RLE movement, evident in gait nd therex. PT carried over therex for LLE stability in gait with attempt to increase LLE stance and RLE swing with some carry over. Patient is able to comply with all cuing for proper technique of therex with good effort throughout session. No increased pain throughout session. PT will continue progression as able.     OBJECTIVE IMPAIRMENTS Abnormal gait, decreased activity tolerance, decreased balance, decreased endurance, decreased knowledge of use of DME, decreased mobility, difficulty walking, decreased ROM, decreased strength, decreased safety awareness, increased fascial restrictions, impaired flexibility, impaired tone, improper body mechanics, postural dysfunction, and pain.   ACTIVITY LIMITATIONS carrying, lifting, bending, squatting, stairs, transfers, bathing, and locomotion level  PARTICIPATION LIMITATIONS: meal prep, cleaning, laundry, community activity, occupation, and yard work  PERSONAL FACTORS Age, Fitness, Past/current experiences, Time since onset of injury/illness/exacerbation, and 3+ comorbidities: HTN, HLD, COPD, DM2  are also affecting patient's functional outcome.   REHAB POTENTIAL: Good  CLINICAL DECISION MAKING: Evolving/moderate complexity  EVALUATION COMPLEXITY: Moderate   GOALS: Goals reviewed with patient? Yes  SHORT TERM GOALS: Target date:  02/05/2022  Pt will be independent with HEP in order to improve strength and balance in order to decrease fall risk and improve function at home and work. Baseline: HEP given  Goal status: INITIAL   LONG TERM GOALS: Target date: 03/05/2022  Pt will increase 10MWT to at least 1.0 m/s in order to demonstrate clinically significant  decreased fall risk in community ambulation.  Baseline: 0.72ms Goal status: INITIAL  2.  Pt will decrease 5TSTS by at least 3 seconds in order to demonstrate clinically significant improvement in LE strength Baseline:  Goal status: INITIAL  3.  Pt will demonstrate 1 RM leg press of LLE of at least 50# in order to demonstrate 90% of RLE in order to complete household ADLs with symmetrical, efficient movement/load Baseline: R: 55# L: 15# Goal status: INITIAL  4.  Patient will increase FOTO score to 56  to demonstrate predicted increase in functional mobility to complete ADLs Baseline: 49 Goal status: INITIAL  5.  PT will demonstrate SLS of at least 10sec bilat to demonstrate decreased fall risk and increased unipedal stability for reactionary balance Baseline: R: 4sec L 2sec  Goal status:     PLAN: PT FREQUENCY: 1-2x/week  PT DURATION: 8 weeks  PLANNED INTERVENTIONS: Therapeutic exercises, Therapeutic activity, Neuromuscular re-education, Balance training, Gait training, Patient/Family education, Self Care, Joint mobilization, Joint manipulation, Stair training, DME instructions, Dry Needling, Electrical stimulation, Spinal manipulation, Spinal mobilization, Cryotherapy, Moist heat, Taping, Traction, Ultrasound, Manual therapy, and Re-evaluation.  PLAN FOR NEXT SESSION: HEP review/update   CDurwin RegesDPT  CDurwin Reges PT 01/08/2022, 9:51 AM

## 2022-01-12 ENCOUNTER — Ambulatory Visit: Payer: Medicare Other | Admitting: Physical Therapy

## 2022-01-12 DIAGNOSIS — M5459 Other low back pain: Secondary | ICD-10-CM

## 2022-01-12 NOTE — Therapy (Signed)
OUTPATIENT PHYSICAL THERAPY THORACOLUMBAR EVALUATION   Patient Name: Dean Simpson MRN: [id number] DOB:1950/10/23, 71 y.o., male Today's Date: 01/12/2022   PT End of Session - 01/12/22 0835     Visit Number 5    Number of Visits 17    Date for PT Re-Evaluation 03/22/22    Authorization - Visit Number 5    Authorization - Number of Visits 10    PT Start Time [phone]    PT Stop Time 0911    PT Time Calculation (min) 38 min    Activity Tolerance Patient tolerated treatment well    Behavior During Therapy WFL for tasks assessed/performed                Past Medical History:  Diagnosis Date   Arthritis    BPH (benign prostatic hyperplasia)    COPD (chronic obstructive pulmonary disease) ([place])    Diabetes mellitus without complication ([place])    Dyspnea    with exertion   Hemorrhoids    History of tobacco abuse    Hx of colonic polyps    Hyperlipidemia    Hypertension    Microscopic hematuria    Obstructive emphysema (HCC)    Proteinuria 02/25/2014   Psoriasis    Right ovarian cyst    Sleep apnea    uses Cpap   Past Surgical History:  Procedure Laterality Date   APPLICATION OF INTRAOPERATIVE CT SCAN N/A 10/26/2021   Procedure: APPLICATION OF INTRAOPERATIVE CT SCAN;  Surgeon: Meade Maw, MD;  Location: [facility] ORS;  Service: Neurosurgery;  Laterality: N/A;   COLONOSCOPY WITH PROPOFOL N/A 09/23/2014   Procedure: COLONOSCOPY WITH PROPOFOL;  Surgeon: Hulen Luster, MD;  Location: [facility] ENDOSCOPY;  Service: Gastroenterology;  Laterality: N/A;   COLONOSCOPY WITH PROPOFOL N/A 03/27/2018   Procedure: COLONOSCOPY WITH PROPOFOL;  Surgeon: Manya Silvas, MD;  Location: [facility] ENDOSCOPY;  Service: Endoscopy;  Laterality: N/A;   TOOTH EXTRACTION     TRANSFORAMINAL LUMBAR INTERBODY FUSION (TLIF) WITH PEDICLE SCREW FIXATION 2 LEVEL N/A 10/26/2021   Procedure: OPEN L4-S1 TRANSFORAMINAL LUMBAR INTERBODY FUSION (TLIF);  Surgeon: Meade Maw, MD;  Location: [facility] ORS;   Service: Neurosurgery;  Laterality: N/A;   Patient Active Problem List   Diagnosis Date Noted   Dizziness 11/10/2021   Hypertensive urgency 11/10/2021   COPD (chronic obstructive pulmonary disease) (HCC)    BPH (benign prostatic hyperplasia)    Diabetes mellitus without complication (HCC)    OSA on CPAP with O2 bleed in    Obesity (BMI 30-39.9)    Frequent PVCs    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 10/26/2021   Type 2 diabetes mellitus with microalbuminuria, without long-term current use of insulin ([place]) 09/17/2013    PCP: Ramonita Lab MD  REFERRING PROVIDER: Meade Maw MD  REFERRING DIAG: L4-S1 TLIF July 10th 2023  Rationale for Evaluation and Treatment Rehabilitation  THERAPY DIAG:  Other low back pain  ONSET DATE: July 10th 2023  SUBJECTIVE:  SUBJECTIVE STATEMENT: Pt reports no pain currently. Was walking without his cane a little this weekend and his L hip got sore with this.   PERTINENT HISTORY:  Pt is a 71 year old male s/p open L4-S1 TLIF July 10th 2023 for radicular LBP. Reports he is doing much better since. Has some remaining L sided posterior leg burning pain, no back pain currently. Has continued LLE pain with ambulating >42mns. Has a quad cane since surgery, would like to not have to use it. Reports no falls in the past 6 months, but reports he feels unsteady on his feet with walking on level surfaces, or navigating stairs. Has 4 steps to enter his  home without handrails. Pt reports per MD no bending, twisting or lifting, or lifting >40lbs. Pt has a back brace he does not have to use anymore. Pt denies N/V, B&B changes, unexplained weight fluctuation, saddle paresthesia, fever, night sweats, or unrelenting night pain at this time. No falls in past 6 months   PAIN:  Are you having  pain? Yes: NPRS scale: 2/10 Pain location: Post LE all the way to the ankle Pain description: burning, sharp Aggravating factors: walking, forward bending Relieving factors: rest   PRECAUTIONS: Fall  WEIGHT BEARING RESTRICTIONS No  FALLS:  Has patient fallen in last 6 months? 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  PLOF: Independent  PATIENT GOALS walk without cane   OBJECTIVE:   DIAGNOSTIC FINDINGS:  Xray 12/09/21 Posterior fusion changes from L4 through S1 without acute osseous abnormality  PATIENT SURVEYS:  FOTO 47 goal 65  SCREENING FOR RED FLAGS: Bowel or bladder incontinence: No Spinal tumors: No Cauda equina syndrome: No Compression fracture: No Abdominal aneurysm: No  COGNITION:  Overall cognitive status: Within functional limits for tasks assessed     SENSATION: WFL  MUSCLE LENGTH: Hamstrings: Right 110 deg; Left 100 deg Thomas test: Shortened bilat Elys test shortened bilat  POSTURE: rounded shoulders, forward head, decreased lumbar lordosis, increased thoracic kyphosis, flexed trunk , and weight shift right  PALPATION: TTP at R lumbar paraspinals, QL and superior glute/glute med fibers  LUMBAR ROM:   Active  A/PROM  eval  Flexion 50% with increased thoracic kyphosis  Extension 25% limited  Right lateral flexion WNL  Left lateral flexion WNL  Right rotation 50% limited  Left rotation 50% limited  No overpressure d/t surgical precautions   (Blank rows = not tested)  LOWER EXTREMITY ROM:     Active  Right eval Left eval  Hip flexion 110 95  Hip extension ~10d 0d  Hip abduction 25% limited 50% limited  Hip adduction    Hip internal rotation ~10d 0d  Hip external rotation  50% limited 50% limited  Knee flexion wnl wnl  Knee extension wnl wnl  Ankle dorsiflexion WNL WNL  Ankle plantarflexion    Ankle inversion    Ankle eversion     (Blank rows = not tested)  Passive mobility = active EXCEPT hip ext is passively 10d bilat  LOWER EXTREMITY MMT:    MMT Right eval Left eval  Hip flexion 4 4-  Hip extension 3+ 3  Hip abduction 4 3+  Hip adduction    Hip internal rotation 4+ 4-  Hip external rotation 4 3+  Knee flexion 5 5  Knee extension 4+ 4+  Ankle dorsiflexion 4+ 4+  Ankle plantarflexion    Ankle inversion    Ankle eversion     (Blank rows = not tested) MMTs graded within available ROM 1RM leg press L: 15# R: 55#  LUMBAR SPECIAL TESTS:  Slump test: Negative, FABER test: Positive, and Thomas test: Positive  FUNCTIONAL TESTS:  5 times sit to stand: 13sec 30 seconds chair stand test 10  GAIT: Distance walked: 43mAssistive device utilized: Quad cane small base Level of assistance: Modified independence Comments: Decreased terminal stance bilat R> L d/t decreased hip ext, R trunk lean and wt shift with QC in R hand, slow speed: 0.58m    TODAY'S TREATMENT  Ther-Ex Nustep seat UE 11 L2 53m98m for gentle motion and strengthening  L single leg STS with R heel propped on floor (elevated mat table) 3x 10 to increase LLE demand with success, cuing throughout for tecnique  Step up 3x 8 with cuing and difficulty with lowering  Side stepping with cane x12 each direction; without cane with CGA with difficulty with eccentric control with L side stepping  OMEGA leg press LLE only 35# 3x8  cuing for eccentric control with good carry over   Hamstring stretch 30sec bilat   PATIENT EDUCATION:  Education details: Patient was educated on diagnosis, anatomy and pathology involved, prognosis, role of PT, and was given an HEP, demonstrating exercise with proper form following verbal and tactile cues, and was given a paper hand out to continue  exercise at home. Pt was educated on and agreed to plan of care.  Person educated: Patient Education method: Explanation, Demonstration, and Handouts Education comprehension: verbalized understanding, returned demonstration, and tactile cues required   HOME EXERCISE PROGRAM: [id number]:  CLINICAL IMPRESSION: PT continued therex progression for increased LLE stability and strength with success. Pt with difficulty in LLE stability with RLE movement, evident in gait nd therex.Patient is able to comply with all cuing for proper technique of therex with good effort throughout session. CGA for safety. No increased pain throughout session. PT will continue progression as able.     OBJECTIVE IMPAIRMENTS Abnormal gait, decreased activity tolerance, decreased balance, decreased endurance, decreased knowledge of use of DME, decreased mobility, difficulty walking, decreased ROM, decreased strength, decreased safety awareness, increased fascial restrictions, impaired flexibility, impaired tone, improper body mechanics, postural dysfunction, and pain.   ACTIVITY LIMITATIONS carrying, lifting, bending, squatting, stairs, transfers, bathing, and locomotion level  PARTICIPATION LIMITATIONS: meal prep, cleaning, laundry, community activity, occupation, and yard work  PERSONAL FACTORS Age, Fitness, Past/current experiences, Time since onset of injury/illness/exacerbation, and 3+ comorbidities: HTN, HLD, COPD, DM2  are also affecting patient's functional outcome.   REHAB POTENTIAL: Good  CLINICAL DECISION MAKING: Evolving/moderate complexity  EVALUATION COMPLEXITY: Moderate   GOALS: Goals reviewed with patient? Yes  SHORT TERM GOALS: Target date: 02/09/2022  Pt will be independent with HEP in order to improve strength and balance in order to decrease fall risk and improve function at home and work. Baseline: HEP given  Goal status: INITIAL   LONG TERM GOALS: Target date:  03/09/2022  Pt will increase 10MWT to at least 1.0 m/s in order to demonstrate clinically significant decreased fall risk in community ambulation.  Baseline: 0.57m53moal status: INITIAL  2.  Pt will decrease 5TSTS by at least 3 seconds in order to demonstrate clinically significant improvement in LE strength Baseline:  Goal status: INITIAL  3.  Pt will demonstrate 1  RM leg press of LLE of at least 50# in order to demonstrate 90% of RLE in order to complete household ADLs with symmetrical, efficient movement/load Baseline: R: 55# L: 15# Goal status: INITIAL  4.  Patient will increase FOTO score to 56  to demonstrate predicted increase in functional mobility to complete ADLs Baseline: 49 Goal status: INITIAL  5.  PT will demonstrate SLS of at least 10sec bilat to demonstrate decreased fall risk and increased unipedal stability for reactionary balance Baseline: R: 4sec L 2sec  Goal status:     PLAN: PT FREQUENCY: 1-2x/week  PT DURATION: 8 weeks  PLANNED INTERVENTIONS: Therapeutic exercises, Therapeutic activity, Neuromuscular re-education, Balance training, Gait training, Patient/Family education, Self Care, Joint mobilization, Joint manipulation, Stair training, DME instructions, Dry Needling, Electrical stimulation, Spinal manipulation, Spinal mobilization, Cryotherapy, Moist heat, Taping, Traction, Ultrasound, Manual therapy, and Re-evaluation.  PLAN FOR NEXT SESSION: HEP review/update   Durwin Reges DPT  Durwin Reges, PT 01/12/2022, 9:15 AM

## 2022-01-14 ENCOUNTER — Ambulatory Visit: Payer: Medicare Other

## 2022-01-14 DIAGNOSIS — M5459 Other low back pain: Secondary | ICD-10-CM

## 2022-01-14 NOTE — Therapy (Signed)
OUTPATIENT PHYSICAL THERAPY THORACOLUMBAR EVALUATION   Patient Name: Dean Simpson MRN: [id number] DOB:1951-04-10, 71 y.o., male Today's Date: 01/14/2022   PT End of Session - 01/14/22 0921     Visit Number 6    Number of Visits 17    Date for PT Re-Evaluation 03/22/22    Authorization Type Medicare primary, Generic commercial secondary    Progress Note Due on Visit 10    PT Start Time 0915    PT Stop Time 0955    PT Time Calculation (min) 40 min    Equipment Utilized During Treatment Gait belt    Activity Tolerance Patient tolerated treatment well;No increased pain    Behavior During Therapy WFL for tasks assessed/performed                Past Medical History:  Diagnosis Date   Arthritis    BPH (benign prostatic hyperplasia)    COPD (chronic obstructive pulmonary disease) (HCC)    Diabetes mellitus without complication (HCC)    Dyspnea    with exertion   Hemorrhoids    History of tobacco abuse    Hx of colonic polyps    Hyperlipidemia    Hypertension    Microscopic hematuria    Obstructive emphysema (HCC)    Proteinuria 02/25/2014   Psoriasis    Right ovarian cyst    Sleep apnea    uses Cpap   Past Surgical History:  Procedure Laterality Date   APPLICATION OF INTRAOPERATIVE CT SCAN N/A 10/26/2021   Procedure: APPLICATION OF INTRAOPERATIVE CT SCAN;  Surgeon: Meade Maw, MD;  Location: [facility] ORS;  Service: Neurosurgery;  Laterality: N/A;   COLONOSCOPY WITH PROPOFOL N/A 09/23/2014   Procedure: COLONOSCOPY WITH PROPOFOL;  Surgeon: Hulen Luster, MD;  Location: [facility] ENDOSCOPY;  Service: Gastroenterology;  Laterality: N/A;   COLONOSCOPY WITH PROPOFOL N/A 03/27/2018   Procedure: COLONOSCOPY WITH PROPOFOL;  Surgeon: Manya Silvas, MD;  Location: [facility] ENDOSCOPY;  Service: Endoscopy;  Laterality: N/A;   TOOTH EXTRACTION     TRANSFORAMINAL LUMBAR INTERBODY FUSION (TLIF) WITH PEDICLE SCREW FIXATION 2 LEVEL N/A 10/26/2021   Procedure: OPEN L4-S1  TRANSFORAMINAL LUMBAR INTERBODY FUSION (TLIF);  Surgeon: Meade Maw, MD;  Location: [facility] ORS;  Service: Neurosurgery;  Laterality: N/A;   Patient Active Problem List   Diagnosis Date Noted   Dizziness 11/10/2021   Hypertensive urgency 11/10/2021   COPD (chronic obstructive pulmonary disease) (HCC)    BPH (benign prostatic hyperplasia)    Diabetes mellitus without complication (HCC)    OSA on CPAP with O2 bleed in    Obesity (BMI 30-39.9)    Frequent PVCs    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 10/26/2021   Type 2 diabetes mellitus with microalbuminuria, without long-term current use of insulin ([place]) 09/17/2013    PCP: Ramonita Lab MD  REFERRING PROVIDER: Meade Maw MD  REFERRING DIAG: L4-S1 TLIF July 10th 2023  Rationale for Evaluation and Treatment Rehabilitation  THERAPY DIAG:  Other low back pain  ONSET DATE: July 10th 2023  SUBJECTIVE:  SUBJECTIVE STATEMENT: Pt reports no pain currently. Was walking without his cane a little this weekend and his L hip got sore with this.   PERTINENT HISTORY:  Pt is a 71 year old male s/p open L4-S1 TLIF July 10th 2023 for radicular LBP. Reports he is doing much better since. Has some remaining L sided posterior leg burning pain, no back pain currently. Has continued LLE pain with ambulating >13mns. Has a quad cane since surgery, would like to not have to use it. Reports no falls in the past 6 months, but reports he feels unsteady on his feet with walking on level surfaces, or navigating stairs. Has 4 steps to enter his  home without handrails. Pt reports per MD no bending, twisting or lifting, or lifting >40lbs. Pt has a back brace he does not have to use anymore. Pt denies N/V, B&B changes, unexplained weight fluctuation, saddle paresthesia, fever, night  sweats, or unrelenting night pain at this time. No falls in past 6 months   PAIN:  Are you having pain? Yes: NPRS scale: 2/10 Pain location: Post LE all the way to the ankle Pain description: burning, sharp Aggravating factors: walking, forward bending Relieving factors: rest   PRECAUTIONS: Fall  WEIGHT BEARING RESTRICTIONS No  FALLS:  Has patient fallen in last 6 months? 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  PLOF: Independent  PATIENT GOALS walk without cane   OBJECTIVE:   DIAGNOSTIC FINDINGS:  Xray 12/09/21 Posterior fusion changes from L4 through S1 without acute osseous abnormality  PATIENT SURVEYS:  FOTO 47 goal 65  SCREENING FOR RED FLAGS: Bowel or bladder incontinence: No Spinal tumors: No Cauda equina syndrome: No Compression fracture: No Abdominal aneurysm: No  COGNITION:  Overall cognitive status: Within functional limits for tasks assessed     SENSATION: WFL  MUSCLE LENGTH: Hamstrings: Right 110 deg; Left 100 deg Thomas test: Shortened bilat Elys test shortened bilat  POSTURE: rounded shoulders, forward head, decreased lumbar lordosis, increased thoracic kyphosis, flexed trunk , and weight shift right  PALPATION: TTP at R lumbar paraspinals, QL and superior glute/glute med fibers  LUMBAR ROM:   Active  A/PROM  eval  Flexion 50% with increased thoracic kyphosis  Extension 25% limited  Right lateral flexion WNL  Left lateral flexion WNL  Right rotation 50% limited  Left rotation 50% limited  No overpressure d/t surgical precautions   (Blank rows = not tested)  LOWER EXTREMITY ROM:     Active  Right eval Left eval  Hip flexion 110 95  Hip extension ~10d 0d  Hip  abduction 25% limited 50% limited  Hip internal rotation ~10d 0d  Hip external rotation 50% limited 50% limited  Knee flexion wnl wnl  Knee extension wnl wnl  Ankle dorsiflexion WNL WNL   (Blank rows = not tested)  Passive mobility = active EXCEPT hip ext is passively 10d bilat  LOWER EXTREMITY MMT:    MMT Right eval Left eval  Hip flexion 4 4-  Hip extension 3+ 3  Hip abduction 4 3+  Hip internal rotation 4+ 4-  Hip external rotation 4 3+  Knee flexion 5 5  Knee extension 4+ 4+  Ankle dorsiflexion 4+ 4+   (Blank rows = not tested) MMTs graded within  available ROM 1RM leg press L: 15# R: 55#  LUMBAR SPECIAL TESTS:  Slump test: Negative, FABER test: Positive, and Thomas test: Positive  FUNCTIONAL TESTS:  5 times sit to stand: 13sec 30 seconds chair stand test 10  GAIT: Distance walked: 27mAssistive device utilized: Quad cane small base Level of assistance: Modified independence Comments: Decreased terminal stance bilat R> L d/t decreased hip ext, R trunk lean and wt shift with QC in R hand, slow speed: 0.553m    TODAY'S TREATMENT  Ther-Ex -AA/ROM and cardiovascular training on Nustep seat UE 11 L2 3m28m  -Hamstring stretch, seated 2x30sec bilat, cues to attend to back precautions  SUPER SET 1 -STS from chair + airex pad 1x10 hands free, slight pain in Left anterior thigh -Steated Hip flexion 1x10 bilat, 3lb AW (seated on airex pad)  -seated Ankle DF c 3lb AW on dorsum 1x15 bilat  -seated greenTB clams, greenball spacer at feet 1x15 -modified tandem stance balance, difficulty executing due to language barrier  SUPER SET 2 -STS from chair + airex pad 1x10 hands free, slight pain in Left anterior thigh -Steated Hip flexion 1x10 bilat, 3lb AW (seated on airex pad; harder on Left)  -seated Ankle DF c 5lb AW on dorsum 1x15 bilat  (left side feels weaker)  -seated greenTB clams, greenball spacer at feet 1x15 (less Left sided excursion)  -modified tandem stance  balance with Google translate to GreMayottestructions  Motor control training:  -lateral side stepping with 2lb AW at treadmill bar (alternate step over SPC) x20 d -forward 6" step taps for balance training with CANE            -anterior step taps c SPC alteranting, used google translate to clarify   Pt easily fatigued and requires sit breaks between standing interventions    PATIENT EDUCATION:  Education details: Patient was educated on diagnosis, anatomy and pathology involved, prognosis, role of PT, and was given an HEP, demonstrating exercise with proper form following verbal and tactile cues, and was given a paper hand out to continue exercise at home. Pt was educated on and agreed to plan of care.  Person educated: Patient Education method: Explanation, Demonstration, and Handouts Education comprehension: verbalized understanding, returned demonstration, and tactile cues required   HOME EXERCISE PROGRAM: [id number]  ASSESSMENT:  CLINICAL IMPRESSION: Strength interventions targeted at areas of defiict seen at eval, LLE still with notable asymmetry/weakness. Pt tolerates session well in general, only slight increases in pain in left lateral proximal thigh, some fatigue at times as well. Able to advance Nustpe time to 6 minutes this session. Patient is able to comply with all cuing for proper technique of therex with good effort throughout session. CGA for safety. No increased pain throughout session. PT will continue progression as able.     OBJECTIVE IMPAIRMENTS Abnormal gait, decreased activity tolerance, decreased balance, decreased endurance, decreased knowledge of use of DME, decreased mobility, difficulty walking, decreased ROM, decreased strength, decreased safety awareness, increased fascial restrictions, impaired flexibility, impaired tone, improper body mechanics, postural dysfunction, and pain.   ACTIVITY LIMITATIONS carrying, lifting, bending, squatting, stairs, transfers,  bathing, and locomotion level  PARTICIPATION LIMITATIONS: meal prep, cleaning, laundry, community activity, occupation, and yard work  PERSONAL FACTORS Age, Fitness, Past/current experiences, Time since onset of injury/illness/exacerbation, and 3+ comorbidities: HTN, HLD, COPD, DM2  are also affecting patient's functional outcome.   REHAB POTENTIAL: Good  CLINICAL DECISION MAKING: Evolving/moderate complexity  EVALUATION COMPLEXITY: Moderate   GOALS: Goals reviewed with patient? Yes  SHORT TERM GOALS: Target date: 02/11/2022  Pt will be independent with HEP in order to improve strength and balance in order to decrease fall risk and improve function at home and work. Baseline: HEP given  Goal status: INITIAL   LONG TERM GOALS: Target date: 03/11/2022  Pt will increase 10MWT to at least 1.0 m/s in order to demonstrate clinically significant decreased fall risk in community ambulation.  Baseline: 0.26ms Goal status: INITIAL  2.  Pt will decrease 5TSTS by at least 3 seconds in order to demonstrate clinically significant improvement in LE strength Baseline:  Goal status: INITIAL  3.  Pt will demonstrate 1 RM leg press of LLE of at least 50# in order to demonstrate 90% of RLE in order to complete household ADLs with symmetrical, efficient movement/load Baseline: R: 55# L: 15# Goal status: INITIAL  4.  Patient will increase FOTO score to 56  to demonstrate predicted increase in functional mobility to complete ADLs Baseline: 49 Goal status: INITIAL  5.  PT will demonstrate SLS of at least 10sec bilat to demonstrate decreased fall risk and increased unipedal stability for reactionary balance Baseline: R: 4sec L 2sec  Goal status:     PLAN: PT FREQUENCY: 1-2x/week  PT DURATION: 8 weeks  PLANNED INTERVENTIONS: Therapeutic exercises, Therapeutic activity, Neuromuscular re-education, Balance training, Gait training, Patient/Family education, Self Care, Joint mobilization,  Joint manipulation, Stair training, DME instructions, Dry Needling, Electrical stimulation, Spinal manipulation, Spinal mobilization, Cryotherapy, Moist heat, Taping, Traction, Ultrasound, Manual therapy, and Re-evaluation.  PLAN FOR NEXT SESSION: HEP review/update    Jai Bear C, PT 01/14/2022, 9:24 AM  9:24 AM, 01/14/22 AEtta Grandchild PT, DPT Physical Therapist - [phone](Office)

## 2022-01-18 ENCOUNTER — Encounter: Payer: Self-pay | Admitting: Physical Therapy

## 2022-01-18 ENCOUNTER — Other Ambulatory Visit: Payer: Self-pay

## 2022-01-18 ENCOUNTER — Ambulatory Visit: Payer: Medicare Other | Attending: Neurosurgery | Admitting: Physical Therapy

## 2022-01-18 DIAGNOSIS — M5459 Other low back pain: Secondary | ICD-10-CM | POA: Insufficient documentation

## 2022-01-18 DIAGNOSIS — Z981 Arthrodesis status: Secondary | ICD-10-CM

## 2022-01-18 NOTE — Therapy (Signed)
OUTPATIENT PHYSICAL THERAPY THORACOLUMBAR EVALUATION   Patient Name: Dean Simpson MRN: [id number] DOB:1951/03/14, 71 y.o., male Today's Date: 01/19/2022   PT End of Session - 01/18/22 1019     Visit Number 7    Number of Visits 17    Date for PT Re-Evaluation 03/22/22    Authorization Type Medicare primary, Generic commercial secondary    Authorization - Visit Number 7    Authorization - Number of Visits 10    Progress Note Due on Visit 10    PT Start Time 1003    PT Stop Time 1042    PT Time Calculation (min) 39 min    Equipment Utilized During Treatment Gait belt    Activity Tolerance Patient tolerated treatment well;No increased pain    Behavior During Therapy WFL for tasks assessed/performed                 Past Medical History:  Diagnosis Date   Arthritis    BPH (benign prostatic hyperplasia)    COPD (chronic obstructive pulmonary disease) (HCC)    Diabetes mellitus without complication (HCC)    Dyspnea    with exertion   Hemorrhoids    History of tobacco abuse    Hx of colonic polyps    Hyperlipidemia    Hypertension    Microscopic hematuria    Obstructive emphysema (HCC)    Proteinuria 02/25/2014   Psoriasis    Right ovarian cyst    Sleep apnea    uses Cpap   Past Surgical History:  Procedure Laterality Date   APPLICATION OF INTRAOPERATIVE CT SCAN N/A 10/26/2021   Procedure: APPLICATION OF INTRAOPERATIVE CT SCAN;  Surgeon: Meade Maw, MD;  Location: [facility] ORS;  Service: Neurosurgery;  Laterality: N/A;   COLONOSCOPY WITH PROPOFOL N/A 09/23/2014   Procedure: COLONOSCOPY WITH PROPOFOL;  Surgeon: Hulen Luster, MD;  Location: [facility] ENDOSCOPY;  Service: Gastroenterology;  Laterality: N/A;   COLONOSCOPY WITH PROPOFOL N/A 03/27/2018   Procedure: COLONOSCOPY WITH PROPOFOL;  Surgeon: Manya Silvas, MD;  Location: [facility] ENDOSCOPY;  Service: Endoscopy;  Laterality: N/A;   TOOTH EXTRACTION     TRANSFORAMINAL LUMBAR INTERBODY FUSION (TLIF)  WITH PEDICLE SCREW FIXATION 2 LEVEL N/A 10/26/2021   Procedure: OPEN L4-S1 TRANSFORAMINAL LUMBAR INTERBODY FUSION (TLIF);  Surgeon: Meade Maw, MD;  Location: [facility] ORS;  Service: Neurosurgery;  Laterality: N/A;   Patient Active Problem List   Diagnosis Date Noted   Dizziness 11/10/2021   Hypertensive urgency 11/10/2021   COPD (chronic obstructive pulmonary disease) (HCC)    BPH (benign prostatic hyperplasia)    Diabetes mellitus without complication (HCC)    OSA on CPAP with O2 bleed in    Obesity (BMI 30-39.9)    Frequent PVCs    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 10/26/2021   Type 2 diabetes mellitus with microalbuminuria, without long-term current use of insulin ([place]) 09/17/2013    PCP: Ramonita Lab MD  REFERRING PROVIDER: Meade Maw MD  REFERRING DIAG: L4-S1 TLIF July 10th 2023  Rationale for Evaluation and Treatment Rehabilitation  THERAPY DIAG:  Other low back pain  ONSET DATE: July 10th 2023  SUBJECTIVE:  SUBJECTIVE STATEMENT: Pt reports he is having burning sensation in his bottom only. Feels like this pain is no better since surgery. No falls since last visit. Feeling stronger, better balance, but is concerned that he is still having burning sensation in L buttock  PERTINENT HISTORY:  Pt is a 71 year old male s/p open L4-S1 TLIF July 10th 2023 for radicular LBP. Reports he is doing much better since. Has some remaining L sided posterior leg burning pain, no back pain currently. Has continued LLE pain with ambulating >85mns. Has a quad cane since surgery, would like to not have to use it. Reports no falls in the past 6 months, but reports he feels unsteady on his feet with walking on level surfaces, or navigating stairs. Has 4 steps to enter his  home without handrails. Pt reports per MD  no bending, twisting or lifting, or lifting >40lbs. Pt has a back brace he does not have to use anymore. Pt denies N/V, B&B changes, unexplained weight fluctuation, saddle paresthesia, fever, night sweats, or unrelenting night pain at this time. No falls in past 6 months   PAIN:  Are you having pain? Yes: NPRS scale: 2/10 Pain location: Post LE all the way to the ankle Pain description: burning, sharp Aggravating factors: walking, forward bending Relieving factors: rest   PRECAUTIONS: Fall  WEIGHT BEARING RESTRICTIONS No  FALLS:  Has patient fallen in last 6 months? 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  PLOF: Independent  PATIENT GOALS walk without cane   OBJECTIVE:   DIAGNOSTIC FINDINGS:  Xray 12/09/21 Posterior fusion changes from L4 through S1 without acute osseous abnormality  PATIENT SURVEYS:  FOTO 47 goal 65  SCREENING FOR RED FLAGS: Bowel or bladder incontinence: No Spinal tumors: No Cauda equina syndrome: No Compression fracture: No Abdominal aneurysm: No  COGNITION:  Overall cognitive status: Within functional limits for tasks assessed     SENSATION: WFL  MUSCLE LENGTH: Hamstrings: Right 110 deg; Left 100 deg Thomas test: Shortened bilat Elys test shortened bilat  POSTURE: rounded shoulders, forward head, decreased lumbar lordosis, increased thoracic kyphosis, flexed trunk , and weight shift right  PALPATION: TTP at R lumbar paraspinals, QL and superior glute/glute med fibers  LUMBAR ROM:   Active  A/PROM  eval  Flexion 50% with increased thoracic kyphosis  Extension 25% limited  Right lateral flexion WNL  Left lateral flexion WNL  Right rotation 50% limited  Left rotation 50%  limited  No overpressure d/t surgical precautions   (Blank rows = not tested)  LOWER EXTREMITY ROM:     Active  Right eval Left eval  Hip flexion 110 95  Hip extension ~10d 0d  Hip abduction 25% limited 50% limited  Hip internal rotation ~10d 0d  Hip external rotation 50% limited 50% limited  Knee flexion wnl wnl  Knee extension wnl wnl  Ankle dorsiflexion WNL WNL   (Blank rows = not tested)  Passive mobility = active EXCEPT hip ext is passively 10d bilat  LOWER EXTREMITY MMT:    MMT Right eval Left eval  Hip flexion 4 4-  Hip extension 3+ 3  Hip abduction 4 3+  Hip internal rotation 4+ 4-  Hip external rotation 4 3+  Knee flexion 5 5  Knee extension 4+ 4+  Ankle dorsiflexion 4+ 4+   (Blank rows = not tested) MMTs graded within available ROM 1RM leg press L: 15# R: 55#  LUMBAR SPECIAL TESTS:  Slump test: Negative, FABER test: Positive, and Thomas test: Positive  FUNCTIONAL TESTS:  5 times sit to stand: 13sec 30 seconds chair stand test 10  GAIT: Distance walked: 575mAssistive device utilized: Quad cane small base Level of assistance: Modified independence Comments: Decreased terminal stance bilat R> L d/t decreased hip ext, R trunk lean and wt shift with QC in R hand, slow speed: 0.573m    TODAY'S TREATMENT  Ther-Ex -AA/ROM and cardiovascular training on Nustep seat UE 11 L2 75m40m  Sciatic nerve glide in supine x1mi45mcompletely alleviates symptoms Bridge 2x 10 with cuing for hip ext > excessive lumbar ext with good carry over Alt supermans 2x 12 with difficulty with L hip ext  L single leg STS from elevated mat table (R heel touch for balance) 2x 6 with cuing for foot placement with good carry over Piriformis stretch (attempted in sitting- unable to obtain position) in supine   Education with spine model on nature of his surgery vs. Periphreal nerve entrapment issues with understanding.  HEP printout given of sciatic nerve glide and piriformis  stretch    PATIENT EDUCATION:  Education details: Patient was educated on diagnosis, anatomy and pathology involved, prognosis, role of PT, and was given an HEP, demonstrating exercise with proper form following verbal and tactile cues, and was given a paper hand out to continue exercise at home. Pt was educated on and agreed to plan of care.  Person educated: Patient Education method: Explanation, Demonstration, and Handouts Education comprehension: verbalized understanding, returned demonstration, and tactile cues required   HOME EXERCISE PROGRAM: [id number]  ASSESSMENT:  CLINICAL IMPRESSION: PT initiated therex for peripheral nerve entrapment with complete resolution of symptoms allowing for increased strengthening progression with success. Patient is able to comply with all cuing for proper technique of all therex with good effort. No pain at end of session. PT will continue progression as able.     OBJECTIVE IMPAIRMENTS Abnormal gait, decreased activity tolerance, decreased balance, decreased endurance, decreased knowledge of use of DME, decreased mobility, difficulty walking, decreased ROM, decreased strength, decreased safety awareness, increased fascial restrictions, impaired flexibility, impaired tone, improper body mechanics, postural dysfunction, and pain.   ACTIVITY LIMITATIONS carrying, lifting, bending, squatting, stairs, transfers, bathing, and locomotion level  PARTICIPATION LIMITATIONS: meal prep, cleaning, laundry, community activity, occupation, and yard work  PERSONAL FACTORS Age, Fitness, Past/current experiences, Time since onset of injury/illness/exacerbation, and 3+ comorbidities: HTN, HLD, COPD, DM2  are also affecting patient's functional outcome.   REHAB POTENTIAL: Good  CLINICAL DECISION MAKING: Evolving/moderate complexity  EVALUATION COMPLEXITY: Moderate   GOALS: Goals reviewed with patient? Yes  SHORT TERM GOALS: Target date: 02/16/2022  Pt will  be independent with HEP in order to improve strength and balance in order to decrease fall risk and improve function at home and work. Baseline: HEP given  Goal status: INITIAL   LONG TERM GOALS: Target date: 03/16/2022  Pt will increase 10MWT to at least 1.0 m/s in order to demonstrate clinically significant decreased fall risk in community ambulation.  Baseline: 0.64m/57mal status: INITIAL  2.  Pt will decrease 5TSTS by at least 3 seconds in order to demonstrate clinically significant improvement in LE strength Baseline:  Goal status: INITIAL  3.  Pt will demonstrate 1 RM leg press of LLE of at least  50# in order to demonstrate 90% of RLE in order to complete household ADLs with symmetrical, efficient movement/load Baseline: R: 55# L: 15# Goal status: INITIAL  4.  Patient will increase FOTO score to 56  to demonstrate predicted increase in functional mobility to complete ADLs Baseline: 49 Goal status: INITIAL  5.  PT will demonstrate SLS of at least 10sec bilat to demonstrate decreased fall risk and increased unipedal stability for reactionary balance Baseline: R: 4sec L 2sec  Goal status:     PLAN: PT FREQUENCY: 1-2x/week  PT DURATION: 8 weeks  PLANNED INTERVENTIONS: Therapeutic exercises, Therapeutic activity, Neuromuscular re-education, Balance training, Gait training, Patient/Family education, Self Care, Joint mobilization, Joint manipulation, Stair training, DME instructions, Dry Needling, Electrical stimulation, Spinal manipulation, Spinal mobilization, Cryotherapy, Moist heat, Taping, Traction, Ultrasound, Manual therapy, and Re-evaluation.  PLAN FOR NEXT SESSION: HEP review/update   Durwin Reges DPT  Durwin Reges, PT 01/19/2022, 11:43 AM  11:43 AM, 01/19/22

## 2022-01-19 ENCOUNTER — Ambulatory Visit (INDEPENDENT_AMBULATORY_CARE_PROVIDER_SITE_OTHER): Payer: Medicare Other | Admitting: Neurosurgery

## 2022-01-19 ENCOUNTER — Ambulatory Visit
Admission: RE | Admit: 2022-01-19 | Discharge: 2022-01-19 | Disposition: A | Discharge status: Home / Self Care | Payer: Medicare Other | Source: Ambulatory Visit | Attending: Neurosurgery | Admitting: Neurosurgery

## 2022-01-19 ENCOUNTER — Ambulatory Visit
Admission: RE | Admit: 2022-01-19 | Discharge: 2022-01-19 | Disposition: A | Discharge status: Home / Self Care | Payer: Medicare Other | Attending: Neurosurgery | Admitting: Neurosurgery

## 2022-01-19 VITALS — BP 136/74 | HR 78 | Temp 97.6°F | Ht 67.0 in | Wt 213.0 lb

## 2022-01-19 DIAGNOSIS — M25552 Pain in left hip: Secondary | ICD-10-CM

## 2022-01-19 DIAGNOSIS — Z981 Arthrodesis status: Secondary | ICD-10-CM

## 2022-01-19 DIAGNOSIS — Z09 Encounter for follow-up examination after completed treatment for conditions other than malignant neoplasm: Secondary | ICD-10-CM

## 2022-01-19 NOTE — Progress Notes (Signed)
   REFERRING PHYSICIAN:  Meade Maw, [address] [place],  [place] [id number]  DOS: 10/26/21 open L4-S1 TLIF   HISTORY OF PRESENT ILLNESS: [facility] Reish is status post lumbar fusion.  He is having some pain with walking around his left hip.  PHYSICAL EXAMINATION:  General: Patient is well developed, well nourished, calm, collected, and in no apparent distress.   NEUROLOGICAL:  General: In no acute distress.   Awake, alert, oriented to person, place, and time.  Pupils equal round and reactive to light.  Facial tone is symmetric.  Tongue protrusion is midline.  There is no pronator drift.   Strength:            Side Iliopsoas Quads Hamstring PF DF EHL  R '5 5 5 5 5 5  '$ L '5 5 5 5 5 5   '$ Incision c/d/I  He has tenderness to palpation around his left greater trochanter of his hip   ROS (Neurologic):  Negative except as noted above  IMAGING: No complications noted  ASSESSMENT/PLAN:  Dean Simpson is doing fair after lumbar fusion.  He has some stiffness in his left hip and has tenderness to palpation around his trochanter.  I am concerned he may have primary hip pathology.  I will send him for orthopedic evaluation.  He is also cleared to start using NSAIDs.  This may help with his pain.  He will continue physical therapy.  I will see him back in 6 months or sooner if he is felt not to have any hip issues.     Meade Maw MD Department of neurosurgery

## 2022-01-21 ENCOUNTER — Encounter: Payer: Medicare Other | Admitting: Physical Therapy

## 2022-01-25 ENCOUNTER — Encounter: Payer: Self-pay | Admitting: Neurosurgery

## 2022-01-26 ENCOUNTER — Encounter: Payer: Self-pay | Admitting: Physical Therapy

## 2022-01-26 ENCOUNTER — Ambulatory Visit: Payer: Medicare Other | Admitting: Physical Therapy

## 2022-01-26 DIAGNOSIS — M5459 Other low back pain: Secondary | ICD-10-CM | POA: Diagnosis not present

## 2022-01-26 NOTE — Therapy (Signed)
OUTPATIENT PHYSICAL THERAPY THORACOLUMBAR EVALUATION   Patient Name: Dean Simpson MRN: [id number] DOB:22-Nov-1950, 71 y.o., male Today's Date: 01/26/2022   PT End of Session - 01/26/22 1005     Visit Number 8    Number of Visits 17    Date for PT Re-Evaluation 03/22/22    Authorization Type Medicare primary, Generic commercial secondary    Authorization - Visit Number 8    Authorization - Number of Visits 10    Progress Note Due on Visit 10    PT Start Time 6440    PT Stop Time 1040    PT Time Calculation (min) 38 min    Activity Tolerance Patient tolerated treatment well;No increased pain    Behavior During Therapy WFL for tasks assessed/performed                  Past Medical History:  Diagnosis Date   Arthritis    BPH (benign prostatic hyperplasia)    COPD (chronic obstructive pulmonary disease) (HCC)    Diabetes mellitus without complication (HCC)    Dyspnea    with exertion   Hemorrhoids    History of tobacco abuse    Hx of colonic polyps    Hyperlipidemia    Hypertension    Microscopic hematuria    Obstructive emphysema (HCC)    Proteinuria 02/25/2014   Psoriasis    Right ovarian cyst    Sleep apnea    uses Cpap   Past Surgical History:  Procedure Laterality Date   APPLICATION OF INTRAOPERATIVE CT SCAN N/A 10/26/2021   Procedure: APPLICATION OF INTRAOPERATIVE CT SCAN;  Surgeon: Meade Maw, MD;  Location: [facility] ORS;  Service: Neurosurgery;  Laterality: N/A;   COLONOSCOPY WITH PROPOFOL N/A 09/23/2014   Procedure: COLONOSCOPY WITH PROPOFOL;  Surgeon: Hulen Luster, MD;  Location: [facility] ENDOSCOPY;  Service: Gastroenterology;  Laterality: N/A;   COLONOSCOPY WITH PROPOFOL N/A 03/27/2018   Procedure: COLONOSCOPY WITH PROPOFOL;  Surgeon: Manya Silvas, MD;  Location: [facility] ENDOSCOPY;  Service: Endoscopy;  Laterality: N/A;   TOOTH EXTRACTION     TRANSFORAMINAL LUMBAR INTERBODY FUSION (TLIF) WITH PEDICLE SCREW FIXATION 2 LEVEL N/A 10/26/2021    Procedure: OPEN L4-S1 TRANSFORAMINAL LUMBAR INTERBODY FUSION (TLIF);  Surgeon: Meade Maw, MD;  Location: [facility] ORS;  Service: Neurosurgery;  Laterality: N/A;   Patient Active Problem List   Diagnosis Date Noted   Dizziness 11/10/2021   Hypertensive urgency 11/10/2021   COPD (chronic obstructive pulmonary disease) (HCC)    BPH (benign prostatic hyperplasia)    Diabetes mellitus without complication (HCC)    OSA on CPAP with O2 bleed in    Obesity (BMI 30-39.9)    Frequent PVCs    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 10/26/2021   Type 2 diabetes mellitus with microalbuminuria, without long-term current use of insulin ([place]) 09/17/2013    PCP: Ramonita Lab MD  REFERRING PROVIDER: Meade Maw MD  REFERRING DIAG: L4-S1 TLIF July 10th 2023  Rationale for Evaluation and Treatment Rehabilitation  THERAPY DIAG:  Other low back pain  ONSET DATE: July 10th 2023  SUBJECTIVE:  SUBJECTIVE STATEMENT: Pt reports he is having burning sensation in his bottom only, feeling better overall, but still present. Nerve glide is helping this pain. Has not been walking too much since last visit.   PERTINENT HISTORY:  Pt is a 71 year old male s/p open L4-S1 TLIF July 10th 2023 for radicular LBP. Reports he is doing much better since. Has some remaining L sided posterior leg burning pain, no back pain currently. Has continued LLE pain with ambulating >36mns. Has a quad cane since surgery, would like to not have to use it. Reports no falls in the past 6 months, but reports he feels unsteady on his feet with walking on level surfaces, or navigating stairs. Has 4 steps to enter his  home without handrails. Pt reports per MD no bending, twisting or lifting, or lifting >40lbs. Pt has a back brace he does not have to use anymore.  Pt denies N/V, B&B changes, unexplained weight fluctuation, saddle paresthesia, fever, night sweats, or unrelenting night pain at this time. No falls in past 6 months   PAIN:  Are you having pain? Yes: NPRS scale: 2/10 Pain location: Post LE all the way to the ankle Pain description: burning, sharp Aggravating factors: walking, forward bending Relieving factors: rest   PRECAUTIONS: Fall  WEIGHT BEARING RESTRICTIONS No  FALLS:  Has patient fallen in last 6 months? 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  PLOF: Independent  PATIENT GOALS walk without cane   OBJECTIVE:   DIAGNOSTIC FINDINGS:  Xray 12/09/21 Posterior fusion changes from L4 through S1 without acute osseous abnormality  PATIENT SURVEYS:  FOTO 47 goal 65  SCREENING FOR RED FLAGS: Bowel or bladder incontinence: No Spinal tumors: No Cauda equina syndrome: No Compression fracture: No Abdominal aneurysm: No  COGNITION:  Overall cognitive status: Within functional limits for tasks assessed     SENSATION: WFL  MUSCLE LENGTH: Hamstrings: Right 110 deg; Left 100 deg Thomas test: Shortened bilat Elys test shortened bilat  POSTURE: rounded shoulders, forward head, decreased lumbar lordosis, increased thoracic kyphosis, flexed trunk , and weight shift right  PALPATION: TTP at R lumbar paraspinals, QL and superior glute/glute med fibers  LUMBAR ROM:   Active  A/PROM  eval  Flexion 50% with increased thoracic kyphosis  Extension 25% limited  Right lateral flexion WNL  Left lateral flexion WNL  Right rotation 50% limited  Left rotation 50% limited  No overpressure d/t surgical precautions   (Blank rows = not tested)  LOWER EXTREMITY ROM:      Active  Right eval Left eval  Hip flexion 110 95  Hip extension ~10d 0d  Hip abduction 25% limited 50% limited  Hip internal rotation ~10d 0d  Hip external rotation 50% limited 50% limited  Knee flexion wnl wnl  Knee extension wnl wnl  Ankle dorsiflexion WNL WNL   (Blank rows = not tested)  Passive mobility = active EXCEPT hip ext is passively 10d bilat  LOWER EXTREMITY MMT:    MMT Right eval Left eval  Hip flexion 4 4-  Hip extension 3+ 3  Hip abduction 4 3+  Hip internal rotation 4+ 4-  Hip external rotation 4 3+  Knee flexion 5 5  Knee extension 4+ 4+  Ankle dorsiflexion 4+ 4+   (  Blank rows = not tested) MMTs graded within available ROM 1RM leg press L: 15# R: 55#  LUMBAR SPECIAL TESTS:  Slump test: Negative, FABER test: Positive, and Thomas test: Positive  FUNCTIONAL TESTS:  5 times sit to stand: 13sec 30 seconds chair stand test 10  GAIT: Distance walked: 33mAssistive device utilized: Quad cane small base Level of assistance: Modified independence Comments: Decreased terminal stance bilat R> L d/t decreased hip ext, R trunk lean and wt shift with QC in R hand, slow speed: 0.580m    TODAY'S TREATMENT  Ther-Ex -AA/ROM and cardiovascular training on Nustep seat UE 11 L2 22m4m  Alt side lunge 2x 10 with fatigue in last few reps, especially with R lunge and return to start position  Standing on LLE only [id number] support 2x 10sec hold Attempted hip hike from 3in step x6 reps pt unable to complete with correct mechanics/understanding  Attempted hip hike from floor - unable  Hip hike with wall push x8 OMEGA leg press 35# 2x 10 with min cuing for foot position Piriformis stretch in supine 30sec Thomas stretch 30sec   PATIENT EDUCATION:  Education details: Patient was educated on diagnosis, anatomy and pathology involved, prognosis, role of PT, and was given an HEP, demonstrating exercise with proper form following verbal and tactile cues, and was given a  paper hand out to continue exercise at home. Pt was educated on and agreed to plan of care.  Person educated: Patient Education method: Explanation, Demonstration, and Handouts Education comprehension: verbalized understanding, returned demonstration, and tactile cues required   HOME EXERCISE PROGRAM: [id number]  ASSESSMENT:  CLINICAL IMPRESSION: PT continued therex progression for LLE stability and strengthening with success. Patient with significant difficulty with lateral stability evident in attempted hip hike sequence. Patient with good effort throughout session. Reports no pain at conclusion of session. PT will continue progression as able.     OBJECTIVE IMPAIRMENTS Abnormal gait, decreased activity tolerance, decreased balance, decreased endurance, decreased knowledge of use of DME, decreased mobility, difficulty walking, decreased ROM, decreased strength, decreased safety awareness, increased fascial restrictions, impaired flexibility, impaired tone, improper body mechanics, postural dysfunction, and pain.   ACTIVITY LIMITATIONS carrying, lifting, bending, squatting, stairs, transfers, bathing, and locomotion level  PARTICIPATION LIMITATIONS: meal prep, cleaning, laundry, community activity, occupation, and yard work  PERSONAL FACTORS Age, Fitness, Past/current experiences, Time since onset of injury/illness/exacerbation, and 3+ comorbidities: HTN, HLD, COPD, DM2  are also affecting patient's functional outcome.   REHAB POTENTIAL: Good  CLINICAL DECISION MAKING: Evolving/moderate complexity  EVALUATION COMPLEXITY: Moderate   GOALS: Goals reviewed with patient? Yes  SHORT TERM GOALS: Target date: 02/23/2022  Pt will be independent with HEP in order to improve strength and balance in order to decrease fall risk and improve function at home and work. Baseline: HEP given  Goal status: INITIAL   LONG TERM GOALS: Target date: 03/23/2022  Pt will increase 10MWT to at least 1.0  m/s in order to demonstrate clinically significant decreased fall risk in community ambulation.  Baseline: 0.41m/19mal status: INITIAL  2.  Pt will decrease 5TSTS by at least 3 seconds in order to demonstrate clinically significant improvement in LE strength Baseline:  Goal status: INITIAL  3.  Pt will demonstrate 1 RM leg press of LLE of at least 50# in order to demonstrate 90% of RLE in order to complete household ADLs with symmetrical, efficient movement/load Baseline: R: 55# L: 15# Goal status: INITIAL  4.  Patient will increase FOTO score to 56  to demonstrate predicted increase in functional mobility to complete ADLs Baseline: 49 Goal status: INITIAL  5.  PT will demonstrate SLS of at least 10sec bilat to demonstrate decreased fall risk and increased unipedal stability for reactionary balance Baseline: R: 4sec L 2sec  Goal status:     PLAN: PT FREQUENCY: 1-2x/week  PT DURATION: 8 weeks  PLANNED INTERVENTIONS: Therapeutic exercises, Therapeutic activity, Neuromuscular re-education, Balance training, Gait training, Patient/Family education, Self Care, Joint mobilization, Joint manipulation, Stair training, DME instructions, Dry Needling, Electrical stimulation, Spinal manipulation, Spinal mobilization, Cryotherapy, Moist heat, Taping, Traction, Ultrasound, Manual therapy, and Re-evaluation.  PLAN FOR NEXT SESSION: HEP review/update   Durwin Reges DPT  Durwin Reges, PT 01/26/2022, 10:39 AM  10:39 AM, 01/26/22

## 2022-01-28 ENCOUNTER — Encounter: Payer: Self-pay | Admitting: Physical Therapy

## 2022-01-28 ENCOUNTER — Ambulatory Visit: Payer: Medicare Other | Admitting: Physical Therapy

## 2022-01-28 DIAGNOSIS — M5459 Other low back pain: Secondary | ICD-10-CM | POA: Diagnosis not present

## 2022-01-28 NOTE — Therapy (Signed)
OUTPATIENT PHYSICAL THERAPY THORACOLUMBAR EVALUATION   Patient Name: Dean Simpson MRN: [id number] DOB:Apr 21, 1950, 71 y.o., male Today's Date: 01/28/2022   PT End of Session - 01/28/22 0931     Visit Number 9    Number of Visits 17    Date for PT Re-Evaluation 03/22/22    Authorization Type Medicare primary, Generic commercial secondary    Authorization - Visit Number 9    Authorization - Number of Visits 10    Progress Note Due on Visit 10    PT Start Time 0919    PT Stop Time 0958    PT Time Calculation (min) 39 min    Activity Tolerance Patient tolerated treatment well;No increased pain    Behavior During Therapy WFL for tasks assessed/performed                   Past Medical History:  Diagnosis Date   Arthritis    BPH (benign prostatic hyperplasia)    COPD (chronic obstructive pulmonary disease) (HCC)    Diabetes mellitus without complication (HCC)    Dyspnea    with exertion   Hemorrhoids    History of tobacco abuse    Hx of colonic polyps    Hyperlipidemia    Hypertension    Microscopic hematuria    Obstructive emphysema (HCC)    Proteinuria 02/25/2014   Psoriasis    Right ovarian cyst    Sleep apnea    uses Cpap   Past Surgical History:  Procedure Laterality Date   APPLICATION OF INTRAOPERATIVE CT SCAN N/A 10/26/2021   Procedure: APPLICATION OF INTRAOPERATIVE CT SCAN;  Surgeon: Meade Maw, MD;  Location: [facility] ORS;  Service: Neurosurgery;  Laterality: N/A;   COLONOSCOPY WITH PROPOFOL N/A 09/23/2014   Procedure: COLONOSCOPY WITH PROPOFOL;  Surgeon: Hulen Luster, MD;  Location: [facility] ENDOSCOPY;  Service: Gastroenterology;  Laterality: N/A;   COLONOSCOPY WITH PROPOFOL N/A 03/27/2018   Procedure: COLONOSCOPY WITH PROPOFOL;  Surgeon: Manya Silvas, MD;  Location: [facility] ENDOSCOPY;  Service: Endoscopy;  Laterality: N/A;   TOOTH EXTRACTION     TRANSFORAMINAL LUMBAR INTERBODY FUSION (TLIF) WITH PEDICLE SCREW FIXATION 2 LEVEL N/A  10/26/2021   Procedure: OPEN L4-S1 TRANSFORAMINAL LUMBAR INTERBODY FUSION (TLIF);  Surgeon: Meade Maw, MD;  Location: [facility] ORS;  Service: Neurosurgery;  Laterality: N/A;   Patient Active Problem List   Diagnosis Date Noted   Dizziness 11/10/2021   Hypertensive urgency 11/10/2021   COPD (chronic obstructive pulmonary disease) (HCC)    BPH (benign prostatic hyperplasia)    Diabetes mellitus without complication (HCC)    OSA on CPAP with O2 bleed in    Obesity (BMI 30-39.9)    Frequent PVCs    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 10/26/2021   Type 2 diabetes mellitus with microalbuminuria, without long-term current use of insulin ([place]) 09/17/2013    PCP: Ramonita Lab MD  REFERRING PROVIDER: Meade Maw MD  REFERRING DIAG: L4-S1 TLIF July 10th 2023  Rationale for Evaluation and Treatment Rehabilitation  THERAPY DIAG:  Other low back pain  ONSET DATE: July 10th 2023  SUBJECTIVE:  SUBJECTIVE STATEMENT: Pt reports his doctor prescribed him celecoxib which is greatly helping his pain. No pain this am. Is completing HEP  PERTINENT HISTORY:  Pt is a 71 year old male s/p open L4-S1 TLIF July 10th 2023 for radicular LBP. Reports he is doing much better since. Has some remaining L sided posterior leg burning pain, no back pain currently. Has continued LLE pain with ambulating >26mns. Has a quad cane since surgery, would like to not have to use it. Reports no falls in the past 6 months, but reports he feels unsteady on his feet with walking on level surfaces, or navigating stairs. Has 4 steps to enter his  home without handrails. Pt reports per MD no bending, twisting or lifting, or lifting >40lbs. Pt has a back brace he does not have to use anymore. Pt denies N/V, B&B changes, unexplained weight fluctuation,  saddle paresthesia, fever, night sweats, or unrelenting night pain at this time. No falls in past 6 months   PAIN:  Are you having pain? Yes: NPRS scale: 2/10 Pain location: Post LE all the way to the ankle Pain description: burning, sharp Aggravating factors: walking, forward bending Relieving factors: rest   PRECAUTIONS: Fall  WEIGHT BEARING RESTRICTIONS No  FALLS:  Has patient fallen in last 6 months? 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  PLOF: Independent  PATIENT GOALS walk without cane   OBJECTIVE:   DIAGNOSTIC FINDINGS:  Xray 12/09/21 Posterior fusion changes from L4 through S1 without acute osseous abnormality  PATIENT SURVEYS:  FOTO 47 goal 65  SCREENING FOR RED FLAGS: Bowel or bladder incontinence: No Spinal tumors: No Cauda equina syndrome: No Compression fracture: No Abdominal aneurysm: No  COGNITION:  Overall cognitive status: Within functional limits for tasks assessed     SENSATION: WFL  MUSCLE LENGTH: Hamstrings: Right 110 deg; Left 100 deg Thomas test: Shortened bilat Elys test shortened bilat  POSTURE: rounded shoulders, forward head, decreased lumbar lordosis, increased thoracic kyphosis, flexed trunk , and weight shift right  PALPATION: TTP at R lumbar paraspinals, QL and superior glute/glute med fibers  LUMBAR ROM:   Active  A/PROM  eval  Flexion 50% with increased thoracic kyphosis  Extension 25% limited  Right lateral flexion WNL  Left lateral flexion WNL  Right rotation 50% limited  Left rotation 50% limited  No overpressure d/t surgical precautions   (Blank rows = not tested)  LOWER EXTREMITY ROM:     Active  Right eval Left eval  Hip flexion 110 95   Hip extension ~10d 0d  Hip abduction 25% limited 50% limited  Hip internal rotation ~10d 0d  Hip external rotation 50% limited 50% limited  Knee flexion wnl wnl  Knee extension wnl wnl  Ankle dorsiflexion WNL WNL   (Blank rows = not tested)  Passive mobility = active EXCEPT hip ext is passively 10d bilat  LOWER EXTREMITY MMT:    MMT Right eval Left eval  Hip flexion 4 4-  Hip extension 3+ 3  Hip abduction 4 3+  Hip internal rotation 4+ 4-  Hip external rotation 4 3+  Knee flexion 5 5  Knee extension 4+ 4+  Ankle dorsiflexion 4+ 4+   (Blank rows = not tested) MMTs graded within available ROM 1RM  leg press L: 15# R: 55#  LUMBAR SPECIAL TESTS:  Slump test: Negative, FABER test: Positive, and Thomas test: Positive  FUNCTIONAL TESTS:  5 times sit to stand: 13sec 30 seconds chair stand test 10  GAIT: Distance walked: 253mAssistive device utilized: Quad cane small base Level of assistance: Modified independence Comments: Decreased terminal stance bilat R> L d/t decreased hip ext, R trunk lean and wt shift with QC in R hand, slow speed: 0.538m    TODAY'S TREATMENT  Ther-Ex -AA/ROM and cardiovascular training on Nustep seat UE 11 L2 53m48m  Alt side lunge 2x 10 with min cuing for foot lift on LLE > slide back to start position Side stepping 2x 10 steps each direction with CGA for safety, pt with hip drop stepping to left Standing on LLE only [id number] support 2x 10sec hold  STS with RLE on foam w/o UE support 2x 10/7 with cuing for eccentric control  OMEGA leg press 45# 2x 10 with min cuing for foot position Piriformis stretch in supine 30sec    PATIENT EDUCATION:  Education details: Patient was educated on diagnosis, anatomy and pathology involved, prognosis, role of PT, and was given an HEP, demonstrating exercise with proper form following verbal and tactile cues, and was given a paper hand out to continue exercise at home. Pt was educated on and agreed to plan of  care.  Person educated: Patient Education method: Explanation, Demonstration, and Handouts Education comprehension: verbalized understanding, returned demonstration, and tactile cues required   HOME EXERCISE PROGRAM: [id number]  ASSESSMENT:  CLINICAL IMPRESSION: PT continued therex progression for LLE stability and strengthening with success. Pt is able to comply with all cuing for technique of therex with expected fatigue/difficulty. Patient continues with evident decreased L (esp lateral) stability/activation. PT will continue progression as able.     OBJECTIVE IMPAIRMENTS Abnormal gait, decreased activity tolerance, decreased balance, decreased endurance, decreased knowledge of use of DME, decreased mobility, difficulty walking, decreased ROM, decreased strength, decreased safety awareness, increased fascial restrictions, impaired flexibility, impaired tone, improper body mechanics, postural dysfunction, and pain.   ACTIVITY LIMITATIONS carrying, lifting, bending, squatting, stairs, transfers, bathing, and locomotion level  PARTICIPATION LIMITATIONS: meal prep, cleaning, laundry, community activity, occupation, and yard work  PERSONAL FACTORS Age, Fitness, Past/current experiences, Time since onset of injury/illness/exacerbation, and 3+ comorbidities: HTN, HLD, COPD, DM2  are also affecting patient's functional outcome.   REHAB POTENTIAL: Good  CLINICAL DECISION MAKING: Evolving/moderate complexity  EVALUATION COMPLEXITY: Moderate   GOALS: Goals reviewed with patient? Yes  SHORT TERM GOALS: Target date: 02/25/2022  Pt will be independent with HEP in order to improve strength and balance in order to decrease fall risk and improve function at home and work. Baseline: HEP given  Goal status: INITIAL   LONG TERM GOALS: Target date: 03/25/2022  Pt will increase 10MWT to at least 1.0 m/s in order to demonstrate clinically significant decreased fall risk in community ambulation.   Baseline: 0.54m/253mal status: INITIAL  2.  Pt will decrease 5TSTS by at least 3 seconds in order to demonstrate clinically significant improvement in LE strength Baseline:  Goal status: INITIAL  3.  Pt will demonstrate 1 RM leg press of LLE of at least 50# in order to demonstrate 90% of RLE in order to complete household ADLs with symmetrical, efficient movement/load Baseline: R: 55# L: 15# Goal status: INITIAL  4.  Patient will increase FOTO score to 56  to demonstrate predicted increase in functional mobility to complete ADLs Baseline: 49  Goal status: INITIAL  5.  PT will demonstrate SLS of at least 10sec bilat to demonstrate decreased fall risk and increased unipedal stability for reactionary balance Baseline: R: 4sec L 2sec  Goal status:     PLAN: PT FREQUENCY: 1-2x/week  PT DURATION: 8 weeks  PLANNED INTERVENTIONS: Therapeutic exercises, Therapeutic activity, Neuromuscular re-education, Balance training, Gait training, Patient/Family education, Self Care, Joint mobilization, Joint manipulation, Stair training, DME instructions, Dry Needling, Electrical stimulation, Spinal manipulation, Spinal mobilization, Cryotherapy, Moist heat, Taping, Traction, Ultrasound, Manual therapy, and Re-evaluation.  PLAN FOR NEXT SESSION: HEP review/update   Durwin Reges DPT  Durwin Reges, PT 01/28/2022, 9:44 AM  9:44 AM, 01/28/22

## 2022-02-01 ENCOUNTER — Encounter: Payer: Self-pay | Admitting: Physical Therapy

## 2022-02-01 ENCOUNTER — Ambulatory Visit: Payer: Medicare Other | Admitting: Physical Therapy

## 2022-02-01 DIAGNOSIS — M5459 Other low back pain: Secondary | ICD-10-CM

## 2022-02-01 NOTE — Therapy (Signed)
OUTPATIENT PHYSICAL THERAPY THORACOLUMBAR Treatment Progress Note: Reporting Period 12/28/21 - 02/01/22    Patient Name: Dean Simpson MRN: [id number] DOB:[birth year], 71 y.o., male Today's Date: 02/01/2022   PT End of Session - 02/01/22 1119     Visit Number 10    Number of Visits 17    Date for PT Re-Evaluation 03/22/22    Authorization Type Medicare primary, Generic commercial secondary    Authorization - Visit Number 10    Authorization - Number of Visits 10    Progress Note Due on Visit 10    PT Start Time 4098    PT Stop Time 1125    PT Time Calculation (min) 40 min    Activity Tolerance Patient tolerated treatment well;No increased pain    Behavior During Therapy WFL for tasks assessed/performed               Past Medical History:  Diagnosis Date   Arthritis    BPH (benign prostatic hyperplasia)    COPD (chronic obstructive pulmonary disease) (HCC)    Diabetes mellitus without complication (HCC)    Dyspnea    with exertion   Hemorrhoids    History of tobacco abuse    Hx of colonic polyps    Hyperlipidemia    Hypertension    Microscopic hematuria    Obstructive emphysema (HCC)    Proteinuria 02/25/2014   Psoriasis    Right ovarian cyst    Sleep apnea    uses Cpap   Past Surgical History:  Procedure Laterality Date   APPLICATION OF INTRAOPERATIVE CT SCAN N/A 10/26/2021   Procedure: APPLICATION OF INTRAOPERATIVE CT SCAN;  Surgeon: Meade Maw, MD;  Location: [facility] ORS;  Service: Neurosurgery;  Laterality: N/A;   COLONOSCOPY WITH PROPOFOL N/A 09/23/2014   Procedure: COLONOSCOPY WITH PROPOFOL;  Surgeon: Hulen Luster, MD;  Location: [facility] ENDOSCOPY;  Service: Gastroenterology;  Laterality: N/A;   COLONOSCOPY WITH PROPOFOL N/A 03/27/2018   Procedure: COLONOSCOPY WITH PROPOFOL;  Surgeon: Manya Silvas, MD;  Location: [facility] ENDOSCOPY;  Service: Endoscopy;  Laterality: N/A;   TOOTH EXTRACTION     TRANSFORAMINAL LUMBAR INTERBODY FUSION (TLIF)  WITH PEDICLE SCREW FIXATION 2 LEVEL N/A 10/26/2021   Procedure: OPEN L4-S1 TRANSFORAMINAL LUMBAR INTERBODY FUSION (TLIF);  Surgeon: Meade Maw, MD;  Location: [facility] ORS;  Service: Neurosurgery;  Laterality: N/A;   Patient Active Problem List   Diagnosis Date Noted   Dizziness 11/10/2021   Hypertensive urgency 11/10/2021   COPD (chronic obstructive pulmonary disease) (HCC)    BPH (benign prostatic hyperplasia)    Diabetes mellitus without complication (HCC)    OSA on CPAP with O2 bleed in    Obesity (BMI 30-39.9)    Frequent PVCs    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 10/26/2021   Type 2 diabetes mellitus with microalbuminuria, without long-term current use of insulin ([place]) 09/17/2013    PCP: Ramonita Lab MD  REFERRING PROVIDER: Meade Maw MD  REFERRING DIAG: L4-S1 TLIF July 10th 2023  Rationale for Evaluation and Treatment Rehabilitation  THERAPY DIAG:  Other low back pain  ONSET DATE: July 10th 2023  SUBJECTIVE:  SUBJECTIVE STATEMENT: celecoxib is continuing to greatly help his pain. Reports he feels like he is getting some better with PT. He thinks his new medication is raising his blood pressure. Completing HEP  PERTINENT HISTORY:  Pt is a 71 year old male s/p open L4-S1 TLIF July 10th 2023 for radicular LBP. Reports he is doing much better since. Has some remaining L sided posterior leg burning pain, no back pain currently. Has continued LLE pain with ambulating >45mns. Has a quad cane since surgery, would like to not have to use it. Reports no falls in the past 6 months, but reports he feels unsteady on his feet with walking on level surfaces, or navigating Simpson. Has 4 steps to enter his  home without handrails. Pt reports per MD no bending, twisting or lifting, or lifting >40lbs. Pt has a  back brace he does not have to use anymore. Pt denies N/V, B&B changes, unexplained weight fluctuation, saddle paresthesia, fever, night sweats, or unrelenting night pain at this time. No falls in past 6 months   PAIN:  Are you having pain? Yes: NPRS scale: 2/10 Pain location: Post LE all the way to the ankle Pain description: burning, sharp Aggravating factors: walking, forward bending Relieving factors: rest   PRECAUTIONS: Fall  WEIGHT BEARING RESTRICTIONS No  FALLS:  Has patient fallen in last 6 months? 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  PLOF: Independent  PATIENT GOALS walk without cane   OBJECTIVE:   DIAGNOSTIC FINDINGS:  Xray 12/09/21 Posterior fusion changes from L4 through S1 without acute osseous abnormality  PATIENT SURVEYS:  FOTO 47 goal 65  SCREENING FOR RED FLAGS: Bowel or bladder incontinence: No Spinal tumors: No Cauda equina syndrome: No Compression fracture: No Abdominal aneurysm: No  COGNITION:  Overall cognitive status: Within functional limits for tasks assessed     SENSATION: WFL  MUSCLE LENGTH: Hamstrings: Right 110 deg; Left 100 deg Thomas test: Shortened bilat Elys test shortened bilat  POSTURE: rounded shoulders, forward head, decreased lumbar lordosis, increased thoracic kyphosis, flexed trunk , and weight shift right  PALPATION: TTP at R lumbar paraspinals, QL and superior glute/glute med fibers  LUMBAR ROM:   Active  A/PROM  eval  Flexion 50% with increased thoracic kyphosis  Extension 25% limited  Right lateral flexion WNL  Left lateral flexion WNL  Right rotation 50% limited  Left rotation 50% limited  No overpressure d/t surgical precautions   (Blank  rows = not tested)  LOWER EXTREMITY ROM:     Active  Right eval Left eval  Hip flexion 110 95  Hip extension ~10d 0d  Hip abduction 25% limited 50% limited  Hip internal rotation ~10d 0d  Hip external rotation 50% limited 50% limited  Knee flexion wnl wnl  Knee extension wnl wnl  Ankle dorsiflexion WNL WNL   (Blank rows = not tested)  Passive mobility = active EXCEPT hip ext is passively 10d bilat  LOWER EXTREMITY MMT:    MMT Right eval Left eval  Hip flexion 4 4-  Hip extension 3+ 3  Hip abduction 4 3+  Hip internal rotation 4+ 4-  Hip external rotation 4 3+  Knee flexion 5 5  Knee extension 4+ 4+  Ankle dorsiflexion 4+ 4+   (  Blank rows = not tested) MMTs graded within available ROM 1RM leg press L: 15# R: 55#  LUMBAR SPECIAL TESTS:  Slump test: Negative, FABER test: Positive, and Thomas test: Positive  FUNCTIONAL TESTS:  5 times sit to stand: 13sec 30 seconds chair stand test 10  GAIT: Distance walked: 713mAssistive device utilized: Quad cane small base Level of assistance: Modified independence Comments: Decreased terminal stance bilat R> L d/t decreased hip ext, R trunk lean and wt shift with QC in R hand, slow speed: 0.543m    TODAY'S TREATMENT  Ther-Ex -AA/ROM and cardiovascular training on Nustep seat UE 11 L2 13m41m  BP 136/72 5xSTS 2 trials; best time 11sec 1RM L leg press 35#; OMEGA leg press 35# 2x 8  Alt side lunge 2x 10/12 with min cuing for foot lift on LLE > slide back to start position L Lateral step up onto 3in step unilateral UE support 2x 10  PT reviewed the following HEP with patient with patient able to demonstrate a set of the following with min cuing for correction needed. PT educated patient on parameters of therex (how/when to inc/decrease intensity, frequency, rep/set range, stretch hold time, and purpose of therex) with verbalized understanding.  Access Code: [email or web address] to Stand Without Arm Support  - 1 x daily - 2 x weekly  - 3 sets - 10 reps - Lateral Lunge  - 1 x daily - 2 x weekly - 3 sets - 10 reps - Standing Single Leg Stance with Counter Support  - 1-2 x daily - 7 x weekly - 10sec hold     PATIENT EDUCATION:  Education details: Patient was educated on diagnosis, anatomy and pathology involved, prognosis, role of PT, and was given an HEP, demonstrating exercise with proper form following verbal and tactile cues, and was given a paper hand out to continue exercise at home. Pt was educated on and agreed to plan of care.  Person educated: Patient Education method: Explanation, Demonstration, and Handouts Education comprehension: verbalized understanding, returned demonstration, and tactile cues required   HOME EXERCISE PROGRAM: [id number]:  CLINICAL IMPRESSION: PT reassessed goals per 10th visit compliance. Pt is making great progress toward goals, meeting FOTO subjective function goal, and nearly meeting 5xSTS general strength goal. With 10MWT speed patient has moved from limited to independent household Ambulator, with progress still needing to be made to reduce fall risk and demonstrate community ambulation speed. Making progress toward, but has not met 1RM power goal for LLE. PT led patient in therex with good carry over of cuing and effort from patient throughout session. PT updated HEP to reflect need for further progress with patient demonstrating and verbalizing understanding. PT will continue progression as able.     OBJECTIVE IMPAIRMENTS Abnormal gait, decreased activity tolerance, decreased balance, decreased endurance, decreased knowledge of use of DME, decreased mobility, difficulty walking, decreased ROM, decreased strength, decreased safety awareness, increased fascial restrictions, impaired flexibility, impaired tone, improper body mechanics, postural dysfunction, and pain.   ACTIVITY LIMITATIONS carrying, lifting, bending, squatting, Simpson, transfers, bathing, and locomotion  level  PARTICIPATION LIMITATIONS: meal prep, cleaning, laundry, community activity, occupation, and yard work  PERSONAL FACTORS Age, Fitness, Past/current experiences, Time since onset of injury/illness/exacerbation, and 3+ comorbidities: HTN, HLD, COPD, DM2  are also affecting patient's functional outcome.   REHAB POTENTIAL: Good  CLINICAL DECISION MAKING: Evolving/moderate complexity  EVALUATION COMPLEXITY: Moderate   GOALS: Goals reviewed with patient? Yes  SHORT TERM GOALS: Target date: 03/01/2022  Pt will  be independent with HEP in order to improve strength and balance in order to decrease fall risk and improve function at home and work. Baseline: HEP given ; 02/01/22 completing HEP, updated this date Goal status: ongoing   LONG TERM GOALS: Target date: 03/29/2022  Pt will increase 10MWT to at least 1.0 m/s in order to demonstrate clinically significant decreased fall risk in community ambulation.  Baseline: 0.63ms; 02/01/22 0.827m Goal status: ongoing  2.  Pt will decrease 5TSTS by at least 3 seconds in order to demonstrate clinically significant improvement in LE strength Baseline: 13sec; 02/01/22 11sec Goal status: ongoing  3.  Pt will demonstrate 1 RM leg press of LLE of at least 50# in order to demonstrate 90% of RLE in order to complete household ADLs with symmetrical, efficient movement/load Baseline: R: 55# L: 15#; 02/01/22 35# Goal status: ongoing  4.  Patient will increase FOTO score to 56  to demonstrate predicted increase in functional mobility to complete ADLs Baseline: 49; 02/01/22 77 Goal Status: Achieved   5.  PT will demonstrate SLS of at least 10sec bilat to demonstrate decreased fall risk and increased unipedal stability for reactionary balance Baseline: R: 4sec L 2sec ; 02/01/22 R: 6sec L 4sec  Goal status: ongoing    PLAN: PT FREQUENCY: 1-2x/week  PT DURATION: 8 weeks  PLANNED INTERVENTIONS: Therapeutic exercises, Therapeutic activity,  Neuromuscular re-education, Balance training, Gait training, Patient/Family education, Self Care, Joint mobilization, Joint manipulation, Stair training, DME instructions, Dry Needling, Electrical stimulation, Spinal manipulation, Spinal mobilization, Cryotherapy, Moist heat, Taping, Traction, Ultrasound, Manual therapy, and Re-evaluation.  PLAN FOR NEXT SESSION: HEP review/update   ChDurwin RegesPT  ChDurwin RegesPT 02/01/2022, 11:52 AM  11:52 AM, 02/01/22

## 2022-02-04 ENCOUNTER — Encounter: Payer: Self-pay | Admitting: Physical Therapy

## 2022-02-04 ENCOUNTER — Ambulatory Visit: Payer: Medicare Other | Admitting: Physical Therapy

## 2022-02-04 DIAGNOSIS — M5459 Other low back pain: Secondary | ICD-10-CM | POA: Diagnosis not present

## 2022-02-04 NOTE — Therapy (Signed)
OUTPATIENT PHYSICAL THERAPY THORACOLUMBAR Treatment Progress Note: Reporting Period 12/28/21 - 02/01/22    Patient Name: Dean Simpson MRN: [id number] DOB:1951-01-25, 71 y.o., male Today's Date: 02/04/2022   PT End of Session - 02/04/22 1052     Visit Number 11    Number of Visits 17    Date for PT Re-Evaluation 03/22/22    Authorization Type Medicare primary, Generic commercial secondary    Authorization - Visit Number 11    Authorization - Number of Visits 17    Progress Note Due on Visit 20    PT Start Time 1050    PT Stop Time 1128    PT Time Calculation (min) 38 min    Activity Tolerance Patient tolerated treatment well;No increased pain    Behavior During Therapy WFL for tasks assessed/performed                Past Medical History:  Diagnosis Date   Arthritis    BPH (benign prostatic hyperplasia)    COPD (chronic obstructive pulmonary disease) (HCC)    Diabetes mellitus without complication (HCC)    Dyspnea    with exertion   Hemorrhoids    History of tobacco abuse    Hx of colonic polyps    Hyperlipidemia    Hypertension    Microscopic hematuria    Obstructive emphysema (HCC)    Proteinuria 02/25/2014   Psoriasis    Right ovarian cyst    Sleep apnea    uses Cpap   Past Surgical History:  Procedure Laterality Date   APPLICATION OF INTRAOPERATIVE CT SCAN N/A 10/26/2021   Procedure: APPLICATION OF INTRAOPERATIVE CT SCAN;  Surgeon: Meade Maw, MD;  Location: [facility] ORS;  Service: Neurosurgery;  Laterality: N/A;   COLONOSCOPY WITH PROPOFOL N/A 09/23/2014   Procedure: COLONOSCOPY WITH PROPOFOL;  Surgeon: Hulen Luster, MD;  Location: [facility] ENDOSCOPY;  Service: Gastroenterology;  Laterality: N/A;   COLONOSCOPY WITH PROPOFOL N/A 03/27/2018   Procedure: COLONOSCOPY WITH PROPOFOL;  Surgeon: Manya Silvas, MD;  Location: [facility]OSCOPY;  Service: Endoscopy;  Laterality: N/A;   TOOTH EXTRACTION     TRANSFORAMINAL LUMBAR INTERBODY FUSION  (TLIF) WITH PEDICLE SCREW FIXATION 2 LEVEL N/A 10/26/2021   Procedure: OPEN L4-S1 TRANSFORAMINAL LUMBAR INTERBODY FUSION (TLIF);  Surgeon: Meade Maw, MD;  Location: [facility] ORS;  Service: Neurosurgery;  Laterality: N/A;   Patient Active Problem List   Diagnosis Date Noted   Dizziness 11/10/2021   Hypertensive urgency 11/10/2021   COPD (chronic obstructive pulmonary disease) (HCC)    BPH (benign prostatic hyperplasia)    Diabetes mellitus without complication (HCC)    OSA on CPAP with O2 bleed in    Obesity (BMI 30-39.9)    Frequent PVCs    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 10/26/2021   Type 2 diabetes mellitus with microalbuminuria, without long-term current use of insulin ([place]) 09/17/2013    PCP: Ramonita Lab MD  REFERRING PROVIDER: Meade Maw MD  REFERRING DIAG: L4-S1 TLIF July 10th 2023  Rationale for Evaluation and Treatment Rehabilitation  THERAPY DIAG:  Other low back pain  ONSET DATE: July 10th 2023  SUBJECTIVE:  SUBJECTIVE STATEMENT: Pt reports doing well this am. 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 No pain this am. Completing HEP- reports lunges are "very hard"  PERTINENT HISTORY:  Pt is a 71 year old male s/p open L4-S1 TLIF July 10th 2023 for radicular LBP. Reports he is doing much better since. Has some remaining L sided posterior leg burning pain, no back pain currently. Has continued LLE pain with ambulating >68mns. Has a quad cane since surgery, would like to not have to use it. Reports no falls in the past 6 months, but reports he feels unsteady on his feet with walking on level surfaces, or navigating stairs. Has 4 steps to enter his  home without handrails. Pt reports per MD no bending, twisting or lifting, or lifting >40lbs. Pt has  a back brace he does not have to use anymore. Pt denies N/V, B&B changes, unexplained weight fluctuation, saddle paresthesia, fever, night sweats, or unrelenting night pain at this time. No falls in past 6 months   PAIN:  Are you having pain? Yes: NPRS scale: 2/10 Pain location: Post LE all the way to the ankle Pain description: burning, sharp Aggravating factors: walking, forward bending Relieving factors: rest   PRECAUTIONS: Fall  WEIGHT BEARING RESTRICTIONS No  FALLS:  Has patient fallen in last 6 months? 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  PLOF: Independent  PATIENT GOALS walk without cane   OBJECTIVE:   DIAGNOSTIC FINDINGS:  Xray 12/09/21 Posterior fusion changes from L4 through S1 without acute osseous abnormality  PATIENT SURVEYS:  FOTO 47 goal 65  SCREENING FOR RED FLAGS: Bowel or bladder incontinence: No Spinal tumors: No Cauda equina syndrome: No Compression fracture: No Abdominal aneurysm: No  COGNITION:  Overall cognitive status: Within functional limits for tasks assessed     SENSATION: WFL  MUSCLE LENGTH: Hamstrings: Right 110 deg; Left 100 deg Thomas test: Shortened bilat Elys test shortened bilat  POSTURE: rounded shoulders, forward head, decreased lumbar lordosis, increased thoracic kyphosis, flexed trunk , and weight shift right  PALPATION: TTP at R lumbar paraspinals, QL and superior glute/glute med fibers  LUMBAR ROM:   Active  A/PROM  eval  Flexion 50% with increased thoracic kyphosis  Extension 25% limited  Right lateral flexion WNL  Left lateral flexion WNL  Right rotation 50% limited  Left rotation 50% limited  No overpressure d/t surgical precautions    (Blank rows = not tested)  LOWER EXTREMITY ROM:     Active  Right eval Left eval  Hip flexion 110 95  Hip extension ~10d 0d  Hip abduction 25% limited 50% limited  Hip internal rotation ~10d 0d  Hip external rotation 50% limited 50% limited  Knee flexion wnl wnl  Knee extension wnl wnl  Ankle dorsiflexion WNL WNL   (Blank rows = not tested)  Passive mobility = active EXCEPT hip ext is passively 10d bilat  LOWER EXTREMITY MMT:    MMT Right eval Left eval  Hip flexion 4 4-  Hip extension 3+ 3  Hip abduction 4 3+  Hip internal rotation 4+ 4-  Hip external rotation 4 3+  Knee flexion 5 5  Knee extension 4+ 4+  Ankle dorsiflexion 4+  4+   (Blank rows = not tested) MMTs graded within available ROM 1RM leg press L: 15# R: 55#  LUMBAR SPECIAL TESTS:  Slump test: Negative, FABER test: Positive, and Thomas test: Positive  FUNCTIONAL TESTS:  5 times sit to stand: 13sec 30 seconds chair stand test 10  GAIT: Distance walked: 43mAssistive device utilized: Quad cane small base Level of assistance: Modified independence Comments: Decreased terminal stance bilat R> L d/t decreased hip ext, R trunk lean and wt shift with QC in R hand, slow speed: 0.558m    TODAY'S TREATMENT  Ther-Ex -AA/ROM and cardiovascular training on Nustep seat UE 11 L2 65m57m  - Alt forward lunges 2x 10 with definite need for unilateral UE support, education on carry over for HEP - amb with 3# AW on LLE [id number] "fastest" speed called out at random - pt reports some pain in L hip at end of walking trial - R standing abd for increased LLE balance demand 2x 12 with cuing for technique good carry over (bilat HHA at bar) OMEGA leg press 45# 3x 10 with min cuing for foot position Verbal review of HEP with understanding Modified thomas stretch 30sec Glute stretch in supine 30sec     PT reviewed the following HEP with patient with patient able to demonstrate a set of the following with min cuing for  correction needed. PT educated patient on parameters of therex (how/when to inc/decrease intensity, frequency, rep/set range, stretch hold time, and purpose of therex) with verbalized understanding.  Access Code: [email or web address] to Stand Without Arm Support  - 1 x daily - 2 x weekly - 3 sets - 10 reps - Lateral Lunge  - 1 x daily - 2 x weekly - 3 sets - 10 reps - Standing Single Leg Stance with Counter Support  - 1-2 x daily - 7 x weekly - 10sec hold     PATIENT EDUCATION:  Education details: Patient was educated on diagnosis, anatomy and pathology involved, prognosis, role of PT, and was given an HEP, demonstrating exercise with proper form following verbal and tactile cues, and was given a paper hand out to continue exercise at home. Pt was educated on and agreed to plan of care.  Person educated: Patient Education method: Explanation, Demonstration, and Handouts Education comprehension: verbalized understanding, returned demonstration, and tactile cues required   HOME EXERCISE PROGRAM: [id number]:  CLINICAL IMPRESSION: PT continued therex progression for increased L hip stability with success. Patient is able to  comply with all cuing for proper technique of therex with good effort throughout session. Patient with no increased pain throughout session. PT will continue progression as able.     OBJECTIVE IMPAIRMENTS Abnormal gait, decreased activity tolerance, decreased balance, decreased endurance, decreased knowledge of use of DME, decreased mobility, difficulty walking, decreased ROM, decreased strength, decreased safety awareness, increased fascial restrictions, impaired flexibility, impaired tone, improper body mechanics, postural dysfunction, and pain.   ACTIVITY LIMITATIONS carrying, lifting, bending, squatting, stairs, transfers, bathing, and locomotion level  PARTICIPATION LIMITATIONS: meal prep, cleaning, laundry, community activity, occupation, and yard  work  PERSONAL FACTORS Age, Fitness, Past/current experiences, Time since onset of injury/illness/exacerbation, and 3+ comorbidities: HTN, HLD, COPD, DM2  are also affecting patient's functional outcome.   REHAB POTENTIAL: Good  CLINICAL DECISION MAKING: Evolving/moderate complexity  EVALUATION COMPLEXITY: Moderate   GOALS: Goals reviewed with patient? Yes  SHORT TERM GOALS: Target date: 03/04/2022  Pt will be independent with HEP in order to improve strength and  balance in order to decrease fall risk and improve function at home and work. Baseline: HEP given ; 02/01/22 completing HEP, updated this date Goal status: ongoing   LONG TERM GOALS: Target date: 04/01/2022  Pt will increase 10MWT to at least 1.0 m/s in order to demonstrate clinically significant decreased fall risk in community ambulation.  Baseline: 0.7ms; 02/01/22 0.896m Goal status: ongoing  2.  Pt will decrease 5TSTS by at least 3 seconds in order to demonstrate clinically significant improvement in LE strength Baseline: 13sec; 02/01/22 11sec Goal status: ongoing  3.  Pt will demonstrate 1 RM leg press of LLE of at least 50# in order to demonstrate 90% of RLE in order to complete household ADLs with symmetrical, efficient movement/load Baseline: R: 55# L: 15#; 02/01/22 35# Goal status: ongoing  4.  Patient will increase FOTO score to 56  to demonstrate predicted increase in functional mobility to complete ADLs Baseline: 49; 02/01/22 77 Goal Status: Achieved   5.  PT will demonstrate SLS of at least 10sec bilat to demonstrate decreased fall risk and increased unipedal stability for reactionary balance Baseline: R: 4sec L 2sec ; 02/01/22 R: 6sec L 4sec  Goal status: ongoing    PLAN: PT FREQUENCY: 1-2x/week  PT DURATION: 8 weeks  PLANNED INTERVENTIONS: Therapeutic exercises, Therapeutic activity, Neuromuscular re-education, Balance training, Gait training, Patient/Family education, Self Care, Joint  mobilization, Joint manipulation, Stair training, DME instructions, Dry Needling, Electrical stimulation, Spinal manipulation, Spinal mobilization, Cryotherapy, Moist heat, Taping, Traction, Ultrasound, Manual therapy, and Re-evaluation.  PLAN FOR NEXT SESSION: HEP review/update   ChDurwin RegesPT  ChDurwin RegesPT 02/04/2022, 1:43 PM  1:43 PM, 02/04/22

## 2022-02-09 ENCOUNTER — Encounter: Payer: Self-pay | Admitting: Physical Therapy

## 2022-02-09 ENCOUNTER — Ambulatory Visit: Payer: Medicare Other | Admitting: Physical Therapy

## 2022-02-09 DIAGNOSIS — M5459 Other low back pain: Secondary | ICD-10-CM

## 2022-02-09 NOTE — Therapy (Signed)
OUTPATIENT PHYSICAL THERAPY THORACOLUMBAR Treatment Progress Note: Reporting Period 12/28/21 - 02/01/22    Patient Name: Dean Simpson MRN: [id number] DOB:08/26/1950, 71 y.o., male Today's Date: 02/09/2022   PT End of Session - 02/09/22 1053     Visit Number 12    Number of Visits 17    Date for PT Re-Evaluation 03/22/22    Authorization Type Medicare primary, Generic commercial secondary    Authorization - Visit Number 12    Authorization - Number of Visits 17    Progress Note Due on Visit 20    PT Start Time 6599    PT Stop Time 1123    PT Time Calculation (min) 38 min    Activity Tolerance Patient tolerated treatment well;No increased pain    Behavior During Therapy WFL for tasks assessed/performed                 Past Medical History:  Diagnosis Date   Arthritis    BPH (benign prostatic hyperplasia)    COPD (chronic obstructive pulmonary disease) (HCC)    Diabetes mellitus without complication (HCC)    Dyspnea    with exertion   Hemorrhoids    History of tobacco abuse    Hx of colonic polyps    Hyperlipidemia    Hypertension    Microscopic hematuria    Obstructive emphysema (HCC)    Proteinuria 02/25/2014   Psoriasis    Right ovarian cyst    Sleep apnea    uses Cpap   Past Surgical History:  Procedure Laterality Date   APPLICATION OF INTRAOPERATIVE CT SCAN N/A 10/26/2021   Procedure: APPLICATION OF INTRAOPERATIVE CT SCAN;  Surgeon: Meade Maw, MD;  Location: [facility] ORS;  Service: Neurosurgery;  Laterality: N/A;   COLONOSCOPY WITH PROPOFOL N/A 09/23/2014   Procedure: COLONOSCOPY WITH PROPOFOL;  Surgeon: Hulen Luster, MD;  Location: [facility] ENDOSCOPY;  Service: Gastroenterology;  Laterality: N/A;   COLONOSCOPY WITH PROPOFOL N/A 03/27/2018   Procedure: COLONOSCOPY WITH PROPOFOL;  Surgeon: Manya Silvas, MD;  Location: [facility] ENDOSCOPY;  Service: Endoscopy;  Laterality: N/A;   TOOTH EXTRACTION     TRANSFORAMINAL LUMBAR INTERBODY FUSION  (TLIF) WITH PEDICLE SCREW FIXATION 2 LEVEL N/A 10/26/2021   Procedure: OPEN L4-S1 TRANSFORAMINAL LUMBAR INTERBODY FUSION (TLIF);  Surgeon: Meade Maw, MD;  Location: [facility] ORS;  Service: Neurosurgery;  Laterality: N/A;   Patient Active Problem List   Diagnosis Date Noted   Dizziness 11/10/2021   Hypertensive urgency 11/10/2021   COPD (chronic obstructive pulmonary disease) (HCC)    BPH (benign prostatic hyperplasia)    Diabetes mellitus without complication (HCC)    OSA on CPAP with O2 bleed in    Obesity (BMI 30-39.9)    Frequent PVCs    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 10/26/2021   Type 2 diabetes mellitus with microalbuminuria, without long-term current use of insulin ([place]) 09/17/2013    PCP: Ramonita Lab MD  REFERRING PROVIDER: Meade Maw MD  REFERRING DIAG: L4-S1 TLIF July 10th 2023  Rationale for Evaluation and Treatment Rehabilitation  THERAPY DIAG:  Other low back pain  ONSET DATE: July 10th 2023  SUBJECTIVE:  SUBJECTIVE STATEMENT: Pt reports he went walking yesterday for 19mns and had increased pain following that bothered him into the night. Feels better today. Completing HEP  PERTINENT HISTORY:  Pt is a 71year old male s/p open L4-S1 TLIF July 10th 2023 for radicular LBP. Reports he is doing much better since. Has some remaining L sided posterior leg burning pain, no back pain currently. Has continued LLE pain with ambulating >370ms. Has a quad cane since surgery, would like to not have to use it. Reports no falls in the past 6 months, but reports he feels unsteady on his feet with walking on level surfaces, or navigating stairs. Has 4 steps to enter his  home without handrails. Pt reports per MD no bending, twisting or lifting, or lifting >40lbs. Pt has a back brace he does not  have to use anymore. Pt denies N/V, B&B changes, unexplained weight fluctuation, saddle paresthesia, fever, night sweats, or unrelenting night pain at this time. No falls in past 6 months   PAIN:  Are you having pain? Yes: NPRS scale: 2/10 Pain location: Post LE all the way to the ankle Pain description: burning, sharp Aggravating factors: walking, forward bending Relieving factors: rest   PRECAUTIONS: Fall  WEIGHT BEARING RESTRICTIONS No  FALLS:  Has patient fallen in last 6 months? 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  PLOF: Independent  PATIENT GOALS walk without cane   OBJECTIVE:   DIAGNOSTIC FINDINGS:  Xray 12/09/21 Posterior fusion changes from L4 through S1 without acute osseous abnormality  PATIENT SURVEYS:  FOTO 47 goal 65  SCREENING FOR RED FLAGS: Bowel or bladder incontinence: No Spinal tumors: No Cauda equina syndrome: No Compression fracture: No Abdominal aneurysm: No  COGNITION:  Overall cognitive status: Within functional limits for tasks assessed     SENSATION: WFL  MUSCLE LENGTH: Hamstrings: Right 110 deg; Left 100 deg Thomas test: Shortened bilat Elys test shortened bilat  POSTURE: rounded shoulders, forward head, decreased lumbar lordosis, increased thoracic kyphosis, flexed trunk , and weight shift right  PALPATION: TTP at R lumbar paraspinals, QL and superior glute/glute med fibers  LUMBAR ROM:   Active  A/PROM  eval  Flexion 50% with increased thoracic kyphosis  Extension 25% limited  Right lateral flexion WNL  Left lateral flexion WNL  Right rotation 50% limited  Left rotation 50% limited  No overpressure d/t surgical precautions   (Blank rows = not  tested)  LOWER EXTREMITY ROM:     Active  Right eval Left eval  Hip flexion 110 95  Hip extension ~10d 0d  Hip abduction 25% limited 50% limited  Hip internal rotation ~10d 0d  Hip external rotation 50% limited 50% limited  Knee flexion wnl wnl  Knee extension wnl wnl  Ankle dorsiflexion WNL WNL   (Blank rows = not tested)  Passive mobility = active EXCEPT hip ext is passively 10d bilat  LOWER EXTREMITY MMT:    MMT Right eval Left eval  Hip flexion 4 4-  Hip extension 3+ 3  Hip abduction 4 3+  Hip internal rotation 4+ 4-  Hip external rotation 4 3+  Knee flexion 5 5  Knee extension 4+ 4+  Ankle dorsiflexion 4+ 4+   (Blank rows = not tested) MMTs graded  within available ROM 1RM leg press L: 15# R: 55#  LUMBAR SPECIAL TESTS:  Slump test: Negative, FABER test: Positive, and Thomas test: Positive  FUNCTIONAL TESTS:  5 times sit to stand: 13sec 30 seconds chair stand test 10  GAIT: Distance walked: 57mAssistive device utilized: Quad cane small base Level of assistance: Modified independence Comments: Decreased terminal stance bilat R> L d/t decreased hip ext, R trunk lean and wt shift with QC in R hand, slow speed: 0.578m    TODAY'S TREATMENT  Ther-Ex -AA/ROM and cardiovascular training on Nustep seat UE 11 L2 33m21m  - Alt lateral step over 6in hurdle 2x 12 1 finger support with cuing for eccentric control with L step  - Alt R heel strike to toe off over 6in hurdle staning on LLE with SPC to mimic ambulation without trendelenburg with understanding x15; carry over into ambulation 100f60fth good carry over Hip hike/wall push x12  OMEGA leg press 45# x6; 35# x 10 with min cuing for foot position Hamstring stretch seated 30sec Glute stretch in sitting with manual assistance 30sec     PT reviewed the following HEP with patient with patient able to demonstrate a set of the following with min cuing for correction needed. PT educated patient on parameters of  therex (how/when to inc/decrease intensity, frequency, rep/set range, stretch hold time, and purpose of therex) with verbalized understanding.  Access Code: [email or web address] to Stand Without Arm Support  - 1 x daily - 2 x weekly - 3 sets - 10 reps - Lateral Lunge  - 1 x daily - 2 x weekly - 3 sets - 10 reps - Standing Single Leg Stance with Counter Support  - 1-2 x daily - 7 x weekly - 10sec hold     PATIENT EDUCATION:  Education details: Patient was educated on diagnosis, anatomy and pathology involved, prognosis, role of PT, and was given an HEP, demonstrating exercise with proper form following verbal and tactile cues, and was given a paper hand out to continue exercise at home. Pt was educated on and agreed to plan of care.  Person educated: Patient Education method: Explanation, Demonstration, and Handouts Education comprehension: verbalized understanding, returned demonstration, and tactile cues required   HOME EXERCISE PROGRAM: [id number]:  CLINICAL IMPRESSION: PT continued therex progression for increased L hip stability with success. Patient is able to  comply with all cuing for proper technique of therex with good effort throughout session. Pt demonstrates good carry over of therex in functional gait with understanding of how to correct trendelenburg as much as possible from a motor control standpoint. Patient with no increased pain throughout session. PT will continue progression as able.     OBJECTIVE IMPAIRMENTS Abnormal gait, decreased activity tolerance, decreased balance, decreased endurance, decreased knowledge of use of DME, decreased mobility, difficulty walking, decreased ROM, decreased strength, decreased safety awareness, increased fascial restrictions, impaired flexibility, impaired tone, improper body mechanics, postural dysfunction, and pain.   ACTIVITY LIMITATIONS carrying, lifting, bending, squatting, stairs, transfers, bathing, and locomotion  level  PARTICIPATION LIMITATIONS: meal prep, cleaning, laundry, community activity, occupation, and yard work  PERSONAL FACTORS Age, Fitness, Past/current experiences, Time since onset of injury/illness/exacerbation, and 3+ comorbidities: HTN, HLD, COPD, DM2  are also affecting patient's functional outcome.   REHAB POTENTIAL: Good  CLINICAL DECISION MAKING: Evolving/moderate complexity  EVALUATION COMPLEXITY: Moderate   GOALS: Goals reviewed with patient? Yes  SHORT TERM GOALS: Target date: 03/09/2022  Pt will be independent with HEP in  order to improve strength and balance in order to decrease fall risk and improve function at home and work. Baseline: HEP given ; 02/01/22 completing HEP, updated this date Goal status: ongoing   LONG TERM GOALS: Target date: 04/06/2022  Pt will increase 10MWT to at least 1.0 m/s in order to demonstrate clinically significant decreased fall risk in community ambulation.  Baseline: 0.19ms; 02/01/22 0.846m Goal status: ongoing  2.  Pt will decrease 5TSTS by at least 3 seconds in order to demonstrate clinically significant improvement in LE strength Baseline: 13sec; 02/01/22 11sec Goal status: ongoing  3.  Pt will demonstrate 1 RM leg press of LLE of at least 50# in order to demonstrate 90% of RLE in order to complete household ADLs with symmetrical, efficient movement/load Baseline: R: 55# L: 15#; 02/01/22 35# Goal status: ongoing  4.  Patient will increase FOTO score to 56  to demonstrate predicted increase in functional mobility to complete ADLs Baseline: 49; 02/01/22 77 Goal Status: Achieved   5.  PT will demonstrate SLS of at least 10sec bilat to demonstrate decreased fall risk and increased unipedal stability for reactionary balance Baseline: R: 4sec L 2sec ; 02/01/22 R: 6sec L 4sec  Goal status: ongoing    PLAN: PT FREQUENCY: 1-2x/week  PT DURATION: 8 weeks  PLANNED INTERVENTIONS: Therapeutic exercises, Therapeutic activity,  Neuromuscular re-education, Balance training, Gait training, Patient/Family education, Self Care, Joint mobilization, Joint manipulation, Stair training, DME instructions, Dry Needling, Electrical stimulation, Spinal manipulation, Spinal mobilization, Cryotherapy, Moist heat, Taping, Traction, Ultrasound, Manual therapy, and Re-evaluation.  PLAN FOR NEXT SESSION: HEP review/update   ChDurwin RegesPT  ChDurwin RegesPT 02/09/2022, 12:05 PM  12:05 PM, 02/09/22

## 2022-02-11 ENCOUNTER — Encounter: Payer: Self-pay | Admitting: Physical Therapy

## 2022-02-11 ENCOUNTER — Ambulatory Visit: Payer: Medicare Other | Admitting: Physical Therapy

## 2022-02-11 DIAGNOSIS — M5459 Other low back pain: Secondary | ICD-10-CM

## 2022-02-11 NOTE — Therapy (Signed)
OUTPATIENT PHYSICAL THERAPY THORACOLUMBAR Treatment/DISCHARGE SUMMARY   Patient Name: Kourosh Kwan Shellhammer MRN: [id number] DOB:1950-07-26, 71 y.o., male Today's Date: 02/11/2022   PT End of Session - 02/11/22 1048     Visit Number 13    Number of Visits 17    Date for PT Re-Evaluation 03/22/22    Authorization Type Medicare primary, Generic commercial secondary    Authorization - Visit Number 34    Authorization - Number of Visits 17    Progress Note Due on Visit 20    PT Start Time 1049    PT Stop Time 1130    PT Time Calculation (min) 41 min    Equipment Utilized During Treatment Gait belt    Activity Tolerance Patient tolerated treatment well;No increased pain    Behavior During Therapy WFL for tasks assessed/performed                  Past Medical History:  Diagnosis Date   Arthritis    BPH (benign prostatic hyperplasia)    COPD (chronic obstructive pulmonary disease) (HCC)    Diabetes mellitus without complication (HCC)    Dyspnea    with exertion   Hemorrhoids    History of tobacco abuse    Hx of colonic polyps    Hyperlipidemia    Hypertension    Microscopic hematuria    Obstructive emphysema (HCC)    Proteinuria 02/25/2014   Psoriasis    Right ovarian cyst    Sleep apnea    uses Cpap   Past Surgical History:  Procedure Laterality Date   APPLICATION OF INTRAOPERATIVE CT SCAN N/A 10/26/2021   Procedure: APPLICATION OF INTRAOPERATIVE CT SCAN;  Surgeon: Meade Maw, MD;  Location: [facility] ORS;  Service: Neurosurgery;  Laterality: N/A;   COLONOSCOPY WITH PROPOFOL N/A 09/23/2014   Procedure: COLONOSCOPY WITH PROPOFOL;  Surgeon: Hulen Luster, MD;  Location: [facility] ENDOSCOPY;  Service: Gastroenterology;  Laterality: N/A;   COLONOSCOPY WITH PROPOFOL N/A 03/27/2018   Procedure: COLONOSCOPY WITH PROPOFOL;  Surgeon: Manya Silvas, MD;  Location: [facility] ENDOSCOPY;  Service: Endoscopy;  Laterality: N/A;   TOOTH EXTRACTION     TRANSFORAMINAL LUMBAR  INTERBODY FUSION (TLIF) WITH PEDICLE SCREW FIXATION 2 LEVEL N/A 10/26/2021   Procedure: OPEN L4-S1 TRANSFORAMINAL LUMBAR INTERBODY FUSION (TLIF);  Surgeon: Meade Maw, MD;  Location: [facility] ORS;  Service: Neurosurgery;  Laterality: N/A;   Patient Active Problem List   Diagnosis Date Noted   Dizziness 11/10/2021   Hypertensive urgency 11/10/2021   COPD (chronic obstructive pulmonary disease) (HCC)    BPH (benign prostatic hyperplasia)    Diabetes mellitus without complication (HCC)    OSA on CPAP with O2 bleed in    Obesity (BMI 30-39.9)    Frequent PVCs    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 10/26/2021   Type 2 diabetes mellitus with microalbuminuria, without long-term current use of insulin ([place]) 09/17/2013    PCP: Ramonita Lab MD  REFERRING PROVIDER: Meade Maw MD  REFERRING DIAG: L4-S1 TLIF July 10th 2023  Rationale for Evaluation and Treatment Rehabilitation  THERAPY DIAG:  Other low back pain  ONSET DATE: July 10th 2023  SUBJECTIVE:  SUBJECTIVE STATEMENT: Pt reports he went walking yesterday for 72mns and had increased pain following that bothered him into the night. Feels better today. Completing HEP  PERTINENT HISTORY:  Pt is a [age]year old male s/p open L4-S1 TLIF July 10th 2023 for radicular LBP. Reports he is doing much better since. Has some remaining L sided posterior leg burning pain, no back pain currently. Has continued LLE pain with ambulating >328ms. Has a quad cane since surgery, would like to not have to use it. Reports no falls in the past 6 months, but reports he feels unsteady on his feet with walking on level surfaces, or navigating stairs. Has 4 steps to enter his  home without handrails. Pt reports per MD no bending, twisting or lifting, or lifting >40lbs. Pt has a back  brace he does not have to use anymore. Pt denies N/V, B&B changes, unexplained weight fluctuation, saddle paresthesia, fever, night sweats, or unrelenting night pain at this time. No falls in past 6 months   PAIN:  Are you having pain? Yes: NPRS scale: 2/10 Pain location: Post LE all the way to the ankle Pain description: burning, sharp Aggravating factors: walking, forward bending Relieving factors: rest   PRECAUTIONS: Fall  WEIGHT BEARING RESTRICTIONS No  FALLS:  Has patient fallen in last 6 months? 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  PLOF: Independent  PATIENT GOALS walk without cane   OBJECTIVE:   DIAGNOSTIC FINDINGS:  Xray 12/09/21 Posterior fusion changes from L4 through S1 without acute osseous abnormality  PATIENT SURVEYS:  FOTO 47 goal 65  SCREENING FOR RED FLAGS: Bowel or bladder incontinence: No Spinal tumors: No Cauda equina syndrome: No Compression fracture: No Abdominal aneurysm: No  COGNITION:  Overall cognitive status: Within functional limits for tasks assessed     SENSATION: WFL  MUSCLE LENGTH: Hamstrings: Right 110 deg; Left 100 deg Thomas test: Shortened bilat Elys test shortened bilat  POSTURE: rounded shoulders, forward head, decreased lumbar lordosis, increased thoracic kyphosis, flexed trunk , and weight shift right  PALPATION: TTP at R lumbar paraspinals, QL and superior glute/glute med fibers  LUMBAR ROM:   Active  A/PROM  eval  Flexion 50% with increased thoracic kyphosis  Extension 25% limited  Right lateral flexion WNL  Left lateral flexion WNL  Right rotation 50% limited  Left rotation 50% limited  No overpressure d/t surgical precautions   (Blank rows  = not tested)  LOWER EXTREMITY ROM:     Active  Right eval Left eval  Hip flexion 110 95  Hip extension ~10d 0d  Hip abduction 25% limited 50% limited  Hip internal rotation ~10d 0d  Hip external rotation 50% limited 50% limited  Knee flexion wnl wnl  Knee extension wnl wnl  Ankle dorsiflexion WNL WNL   (Blank rows = not tested)  Passive mobility = active EXCEPT hip ext is passively 10d bilat  LOWER EXTREMITY MMT:    MMT Right eval Left eval  Hip flexion 4 4-  Hip extension 3+ 3  Hip abduction 4 3+  Hip internal rotation 4+ 4-  Hip external rotation 4 3+  Knee flexion 5 5  Knee extension 4+ 4+  Ankle dorsiflexion 4+ 4+   (Blank rows = not tested) MMTs graded  within available ROM 1RM leg press L: 15# R: 55#  LUMBAR SPECIAL TESTS:  Slump test: Negative, FABER test: Positive, and Thomas test: Positive  FUNCTIONAL TESTS:  5 times sit to stand: 13sec 30 seconds chair stand test 10  GAIT: Distance walked: 82mAssistive device utilized: Quad cane small base Level of assistance: Modified independence Comments: Decreased terminal stance bilat R> L d/t decreased hip ext, R trunk lean and wt shift with QC in R hand, slow speed: 0.559m    TODAY'S TREATMENT  Ther-Ex -AA/ROM and cardiovascular training on Nustep seat UE 11 L2 8m50m  5xSTS trial 1: 11sec 2: sec 1RM leg press 35# LLE  PT reviewed the following HEP with patient with patient able to demonstrate a set of the following with min cuing for correction needed. PT educated patient on parameters of therex (how/when to inc/decrease intensity, frequency, rep/set range, stretch hold time, and purpose of therex) with verbalized understanding.  Access Code: [email or web address] to Stand Without Arm Support  - 1 x daily - 2 x weekly - 3 sets - 6-10 reps - Lateral Lunge  - 1 x daily - 2 x weekly - 3 sets - 12-20 reps - Standing Hip Abduction with Counter Support  - 1 x daily - 2 x weekly - 3 sets - 10 reps - Standing Single  Leg Stance with Counter Support  - 1-2 x daily - 7 x weekly - 10sec hold - Modified Thomas Stretch  - 1-2 x daily - 7 x weekly - 1mi61mold     PATIENT EDUCATION:  Education details: Patient was educated on diagnosis, anatomy and pathology involved, prognosis, role of PT, and was given an HEP, demonstrating exercise with proper form following verbal and tactile cues, and was given a paper hand out to continue exercise at home. Pt was educated on and agreed to plan of care.  Person educated: Patient Education method: Explanation, Demonstration, and Handouts Education comprehension: verbalized understanding, returned demonstration, and tactile cues required   HOME EXERCISE PROGRAM: [id number]:  CLINICAL IMPRESSION: PT reassessed goals this session where patient has met most goals, and has made great progress toward the goals he has not met to safely d/c formal PT to HEP for continued progress toward strength and gait speed goals.Gait speed seems limited more by pain at this time. Patient without great decrease in pain since spinal surgery; seeking further treatment options for this. Patient is able to demonstrate and verbalize understanding of all HEP recommendations with minimal corrections needed. Pt given clinic contact info should further questions or concerns arise. Pt to d/c PT.       OBJECTIVE IMPAIRMENTS Abnormal gait, decreased activity tolerance, decreased balance, decreased endurance, decreased knowledge of use of DME, decreased mobility, difficulty walking, decreased ROM, decreased strength, decreased safety awareness, increased fascial restrictions, impaired flexibility, impaired tone, improper body mechanics, postural dysfunction, and pain.   ACTIVITY LIMITATIONS carrying, lifting, bending, squatting, stairs, transfers, bathing, and locomotion level  PARTICIPATION LIMITATIONS: meal prep, cleaning, laundry, community activity, occupation, and yard work  PERSONAL  FACTORS Age, Fitness, Past/current experiences, Time since onset of injury/illness/exacerbation, and 3+ comorbidities: HTN, HLD, COPD, DM2  are also affecting patient's functional outcome.   REHAB POTENTIAL: Good  CLINICAL DECISION MAKING: Evolving/moderate complexity  EVALUATION COMPLEXITY: Moderate   GOALS: Goals reviewed with patient? Yes  SHORT TERM GOALS: Target date: 03/11/2022  Pt will be independent with HEP in order to improve strength and balance in order to decrease fall risk  and improve function at home and work. Baseline: HEP given ; 02/01/22 completing HEP, updated this date Goal status: ongoing   LONG TERM GOALS: Target date: 04/08/2022  Pt will increase 10MWT to at least 1.0 m/s in order to demonstrate clinically significant decreased fall risk in community ambulation.  Baseline: 0.35ms; 02/01/22 0.855m 02/11/22 0.8367mGoal status: ongoing  2.  Pt will decrease 5TSTS by at least 3 seconds in order to demonstrate clinically significant improvement in LE strength Baseline: 13sec; 02/01/22 11sec; 02/11/22 10sec Goal status: achieved   3.  Pt will demonstrate 1 RM leg press of LLE of at least 50# in order to demonstrate 90% of RLE in order to complete household ADLs with symmetrical, efficient movement/load Baseline: R: 55# L: 15#; 02/01/22 35# ; 02/11/22 40# Goal status: not met  4.  Patient will increase FOTO score to 56  to demonstrate predicted increase in functional mobility to complete ADLs Baseline: 49; 02/01/22 77 Goal Status: Achieved   5.  PT will demonstrate SLS of at least 10sec bilat to demonstrate decreased fall risk and increased unipedal stability for reactionary balance Baseline: R: 4sec L 2sec ; 02/01/22 R: 6sec L 4sec ; 02/09/22 R: 6sec L 4sec  Goal status: not met    PLAN: PT FREQUENCY: 1-2x/week  PT DURATION: 8 weeks  PLANNED INTERVENTIONS: Therapeutic exercises, Therapeutic activity, Neuromuscular re-education, Balance training,  Gait training, Patient/Family education, Self Care, Joint mobilization, Joint manipulation, Stair training, DME instructions, Dry Needling, Electrical stimulation, Spinal manipulation, Spinal mobilization, Cryotherapy, Moist heat, Taping, Traction, Ultrasound, Manual therapy, and Re-evaluation.  PLAN FOR NEXT SESSION: HEP review/update   CheDurwin RegesT  CheDurwin RegesT 02/11/2022, 11:19 AM  11:19 AM, 02/11/22

## 2022-07-21 ENCOUNTER — Other Ambulatory Visit: Payer: Self-pay

## 2022-07-21 DIAGNOSIS — M5416 Radiculopathy, lumbar region: Secondary | ICD-10-CM

## 2022-07-21 NOTE — Progress Notes (Unsigned)
   REFERRING PHYSICIAN:  Adin Hector, Md [place],  [place] [address]  DOS: 10/26/21 open L4-S1 TLIF   HISTORY OF PRESENT ILLNESS: Dean Simpson is approximately 9 months status post lumbar fusion.  At his last visit he was complaining of some left hip pain which was felt to be primarily hip pathology.  Dr. Cari Caraway sent him to orthopedics for further evaluation. ***  PHYSICAL EXAMINATION:  General: Patient is well developed, well nourished, calm, collected, and in no apparent distress.   NEUROLOGICAL:  General: In no acute distress.   Awake, alert, oriented to person, place, and time.  Pupils equal round and reactive to light.  Facial tone is symmetric.  Tongue protrusion is midline.  There is no pronator drift.   Strength:            Side Iliopsoas Quads Hamstring PF DF EHL  R 5 5 5 5 5 5   L 5 5 5 5 5 5    Incision c/d/I ***   ROS (Neurologic):  Negative except as noted above  IMAGING: ***  ASSESSMENT/PLAN:  Fed Georgios Lombard is doing well approximately 9 months after lumbar fusion.  he will follow up ***  ***  I have advised the patient to lift up to 10 pounds until 6 weeks after surgery, then increase up to 25 pounds until 12 weeks after surgery.  After 12 weeks post-op, the patient advised to increase activity as tolerated.  Advised to contact the office if any questions or concerns arise.  Cooper Render PA-C Department of neurosurgery

## 2022-07-22 ENCOUNTER — Ambulatory Visit (INDEPENDENT_AMBULATORY_CARE_PROVIDER_SITE_OTHER): Payer: Medicare Other | Admitting: Neurosurgery

## 2022-07-22 ENCOUNTER — Ambulatory Visit: Payer: Self-pay | Admitting: Neurosurgery

## 2022-07-22 ENCOUNTER — Encounter: Payer: Self-pay | Admitting: Neurosurgery

## 2022-07-22 ENCOUNTER — Ambulatory Visit
Admission: RE | Admit: 2022-07-22 | Discharge: 2022-07-22 | Disposition: A | Discharge status: Home / Self Care | Payer: Medicare Other | Source: Ambulatory Visit | Attending: Neurosurgery | Admitting: Neurosurgery

## 2022-07-22 VITALS — BP 130/70 | HR 76 | Ht 68.0 in | Wt 213.0 lb

## 2022-07-22 DIAGNOSIS — M5416 Radiculopathy, lumbar region: Secondary | ICD-10-CM | POA: Diagnosis not present

## 2022-07-22 DIAGNOSIS — Z09 Encounter for follow-up examination after completed treatment for conditions other than malignant neoplasm: Secondary | ICD-10-CM | POA: Diagnosis not present

## 2022-07-30 ENCOUNTER — Encounter: Payer: Self-pay | Admitting: *Deleted

## 2022-08-02 ENCOUNTER — Ambulatory Visit
Admission: RE | Admit: 2022-08-02 | Discharge: 2022-08-02 | Disposition: A | Discharge status: Home / Self Care | Payer: Medicare Other | Attending: Gastroenterology | Admitting: Gastroenterology

## 2022-08-02 ENCOUNTER — Encounter
Admission: RE | Disposition: A | Discharge status: Home / Self Care | Payer: Self-pay | Source: Home / Self Care | Attending: Gastroenterology

## 2022-08-02 ENCOUNTER — Ambulatory Visit: Payer: Medicare Other | Admitting: Registered Nurse

## 2022-08-02 DIAGNOSIS — K602 Anal fissure, unspecified: Secondary | ICD-10-CM | POA: Insufficient documentation

## 2022-08-02 DIAGNOSIS — I10 Essential (primary) hypertension: Secondary | ICD-10-CM | POA: Diagnosis not present

## 2022-08-02 DIAGNOSIS — J439 Emphysema, unspecified: Secondary | ICD-10-CM | POA: Diagnosis not present

## 2022-08-02 DIAGNOSIS — G4730 Sleep apnea, unspecified: Secondary | ICD-10-CM | POA: Diagnosis not present

## 2022-08-02 DIAGNOSIS — E119 Type 2 diabetes mellitus without complications: Secondary | ICD-10-CM | POA: Diagnosis not present

## 2022-08-02 DIAGNOSIS — Z87891 Personal history of nicotine dependence: Secondary | ICD-10-CM | POA: Diagnosis not present

## 2022-08-02 DIAGNOSIS — K625 Hemorrhage of anus and rectum: Secondary | ICD-10-CM | POA: Diagnosis not present

## 2022-08-02 DIAGNOSIS — D120 Benign neoplasm of cecum: Secondary | ICD-10-CM | POA: Insufficient documentation

## 2022-08-02 DIAGNOSIS — Z79899 Other long term (current) drug therapy: Secondary | ICD-10-CM | POA: Insufficient documentation

## 2022-08-02 DIAGNOSIS — K640 First degree hemorrhoids: Secondary | ICD-10-CM | POA: Insufficient documentation

## 2022-08-02 DIAGNOSIS — D123 Benign neoplasm of transverse colon: Secondary | ICD-10-CM | POA: Diagnosis not present

## 2022-08-02 DIAGNOSIS — Z7984 Long term (current) use of oral hypoglycemic drugs: Secondary | ICD-10-CM | POA: Insufficient documentation

## 2022-08-02 HISTORY — PX: COLONOSCOPY WITH PROPOFOL: SHX5780

## 2022-08-02 LAB — GLUCOSE, CAPILLARY: Glucose-Capillary: 121 mg/dL — ABNORMAL HIGH (ref 70–99)

## 2022-08-02 SURGERY — COLONOSCOPY WITH PROPOFOL
Anesthesia: General

## 2022-08-02 MED ORDER — LIDOCAINE HCL (CARDIAC) PF 100 MG/5ML IV SOSY
PREFILLED_SYRINGE | INTRAVENOUS | Status: DC | PRN
  Administered 2022-08-02: 40 mg via INTRAVENOUS

## 2022-08-02 MED ORDER — SODIUM CHLORIDE 0.9 % IV SOLN: INTRAVENOUS | Status: DC

## 2022-08-02 MED ORDER — PROPOFOL 500 MG/50ML IV EMUL
INTRAVENOUS | Status: DC | PRN
  Administered 2022-08-02: 100 ug/kg/min via INTRAVENOUS

## 2022-08-02 MED ORDER — PROPOFOL 10 MG/ML IV BOLUS
INTRAVENOUS | Status: DC | PRN
  Administered 2022-08-02: 50 mg via INTRAVENOUS

## 2022-08-02 NOTE — Interval H&P Note (Signed)
History and Physical Interval Note:  08/02/2022 1:44 PM  Dean Simpson  has presented today for surgery, with the diagnosis of Rectal Mass.  The various methods of treatment have been discussed with the patient and family. After consideration of risks, benefits and other options for treatment, the patient has consented to  Procedure(s): COLONOSCOPY WITH PROPOFOL (N/A) as a surgical intervention.  The patient's history has been reviewed, patient examined, no change in status, stable for surgery.  I have reviewed the patient's chart and labs.  Questions were answered to the patient's satisfaction.     Regis Bill  Ok to proceed with colonoscopy

## 2022-08-02 NOTE — Anesthesia Preprocedure Evaluation (Addendum)
Anesthesia Evaluation  Patient identified by MRN, date of birth, ID band Patient awake    Reviewed: Allergy & Precautions, NPO status , Patient's Chart, lab work & pertinent test results  History of Anesthesia Complications Negative for: history of anesthetic complications  Airway Mallampati: III  TM Distance: >3 FB Neck ROM: full    Dental  (+) Poor Dentition   Pulmonary shortness of breath and with exertion, sleep apnea and Continuous Positive Airway Pressure Ventilation , COPD,  COPD inhaler, former smoker   Pulmonary exam normal        Cardiovascular hypertension, On Home Beta Blockers and On Medications Normal cardiovascular exam     Neuro/Psych negative neurological ROS  negative psych ROS   GI/Hepatic negative GI ROS, Neg liver ROS,,,  Endo/Other  diabetes, Oral Hypoglycemic Agents    Renal/GU negative Renal ROS  negative genitourinary   Musculoskeletal   Abdominal   Peds  Hematology negative hematology ROS (+)   Anesthesia Other Findings Past Medical History: No date: Arthritis No date: BPH (benign prostatic hyperplasia) No date: COPD (chronic obstructive pulmonary disease) No date: Diabetes mellitus without complication No date: Dyspnea     Comment:  with exertion No date: Hemorrhoids No date: History of tobacco abuse No date: Hx of colonic polyps No date: Hyperlipidemia No date: Hypertension No date: Microscopic hematuria No date: Obstructive emphysema 02/25/2014: Proteinuria No date: Psoriasis No date: Right ovarian cyst No date: Sleep apnea     Comment:  uses Cpap  Past Surgical History: 10/26/2021: APPLICATION OF INTRAOPERATIVE CT SCAN; N/A     Comment:  Procedure: APPLICATION OF INTRAOPERATIVE CT SCAN;                Surgeon: Venetia Night, MD;  Location: [facility] ORS;                Service: Neurosurgery;  Laterality: N/A; No date: BACK SURGERY 09/23/2014: COLONOSCOPY WITH PROPOFOL;  N/A     Comment:  Procedure: COLONOSCOPY WITH PROPOFOL;  Surgeon: Wallace Cullens, MD;  Location: [facility] ENDOSCOPY;  Service:               Gastroenterology;  Laterality: N/A; 03/27/2018: COLONOSCOPY WITH PROPOFOL; N/A     Comment:  Procedure: COLONOSCOPY WITH PROPOFOL;  Surgeon: Scot Jun, MD;  Location: [facility] ENDOSCOPY;  Service:               Endoscopy;  Laterality: N/A; No date: TOOTH EXTRACTION 10/26/2021: TRANSFORAMINAL LUMBAR INTERBODY FUSION (TLIF) WITH  PEDICLE SCREW FIXATION 2 LEVEL; N/A     Comment:  Procedure: OPEN L4-S1 TRANSFORAMINAL LUMBAR INTERBODY               FUSION (TLIF);  Surgeon: Venetia Night, MD;                Location: [facility] ORS;  Service: Neurosurgery;  Laterality:               N/A;  BMI 32.39   Reproductive/Obstetrics negative OB ROS                             Anesthesia Physical Anesthesia Plan  ASA: 3  Anesthesia Plan: General   Post-op Pain Management: Minimal or no pain anticipated   Induction: Intravenous  PONV Risk Score and  Plan: Propofol infusion and TIVA  Airway Management Planned: Natural Airway and Nasal Cannula  Additional Equipment:   Intra-op Plan:   Post-operative Plan:   Informed Consent: I have reviewed the patients History and Physical, chart, labs and discussed the procedure including the risks, benefits and alternatives for the proposed anesthesia with the patient or authorized representative who has indicated his/her understanding and acceptance.     Dental Advisory Given  Plan Discussed with: Anesthesiologist, CRNA and Surgeon  Anesthesia Plan Comments: (Patient consented for risks of anesthesia including but not limited to:  - adverse reactions to medications - risk of airway placement if required - damage to eyes, teeth, lips or other oral mucosa - nerve damage due to positioning  - sore throat or hoarseness - Damage to heart, brain, nerves, lungs, other  parts of body or loss of life  Patient voiced understanding.)        Anesthesia Quick Evaluation

## 2022-08-02 NOTE — Transfer of Care (Signed)
Immediate Anesthesia Transfer of Care Note  Patient: Dean Simpson  Procedure(s) Performed: COLONOSCOPY WITH PROPOFOL  Patient Location: PACU  Anesthesia Type:General  Level of Consciousness: drowsy  Airway & Oxygen Therapy: Patient Spontanous Breathing and Patient connected to face mask oxygen  Post-op Assessment: Report given to RN and Post -op Vital signs reviewed and stable  Post vital signs: stable  Last Vitals:  Vitals Value Taken Time  BP 124/59 08/02/22 1416  Temp 36.1 C 08/02/22 1416  Pulse 66 08/02/22 1417  Resp 21 08/02/22 1417  SpO2 97 % 08/02/22 1417  Vitals shown include unvalidated device data.  Last Pain:  Vitals:   08/02/22 1416  TempSrc: Temporal  PainSc:          Complications: No notable events documented.

## 2022-08-02 NOTE — Anesthesia Postprocedure Evaluation (Signed)
Anesthesia Post Note  Patient: Dean Simpson  Procedure(s) Performed: COLONOSCOPY WITH PROPOFOL  Patient location during evaluation: Endoscopy Anesthesia Type: General Level of consciousness: awake and alert Pain management: pain level controlled Vital Signs Assessment: post-procedure vital signs reviewed and stable Respiratory status: spontaneous breathing, nonlabored ventilation, respiratory function stable and patient connected to nasal cannula oxygen Cardiovascular status: blood pressure returned to baseline and stable Postop Assessment: no apparent nausea or vomiting Anesthetic complications: no   No notable events documented.   Last Vitals:  Vitals:   08/02/22 1259  BP: (!) 140/70  Pulse: 70  Resp: 19  Temp: (!) 36.2 C  SpO2: 95%    Last Pain:  Vitals:   08/02/22 1259  TempSrc: Temporal  PainSc: 0-No pain                 Louie Boston

## 2022-08-02 NOTE — Op Note (Signed)
[facility] Patient Name: Dean Simpson Procedure Date: 08/02/2022 1:40 PM MRN: [id number] Account #: [id number] Date of Birth: May 26, 1950 Admit Type: Outpatient Age: 72 Room: [facility] ENDO ROOM 3 Gender: Male Note Status: Finalized Instrument Name: Colonoscope [id number] Procedure:             Colonoscopy Indications:           Rectal bleeding Providers:             Eather Colas MD, MD Referring MD:          Daniel Nones, MD (Referring MD) Medicines:             Monitored Anesthesia Care Complications:         No immediate complications. Estimated blood loss:                         Minimal. Procedure:             Pre-Anesthesia Assessment:                        - Prior to the procedure, a History and Physical was                         performed, and patient medications and allergies were                         reviewed. The patient is competent. The risks and                         benefits of the procedure and the sedation options and                         risks were discussed with the patient. All questions                         were answered and informed consent was obtained.                         Patient identification and proposed procedure were                         verified by the physician, the nurse, the                         anesthesiologist, the anesthetist and the technician                         in the endoscopy suite. Mental Status Examination:                         alert and oriented. Airway Examination: normal                         oropharyngeal airway and neck mobility. Respiratory                         Examination: clear to auscultation. CV Examination:  normal. Prophylactic Antibiotics: The patient does not                         require prophylactic antibiotics. Prior                         Anticoagulants: The patient has taken no anticoagulant                         or  antiplatelet agents. ASA Grade Assessment: III - A                         patient with severe systemic disease. After reviewing                         the risks and benefits, the patient was deemed in                         satisfactory condition to undergo the procedure. The                         anesthesia plan was to use monitored anesthesia care                         (MAC). Immediately prior to administration of                         medications, the patient was re-assessed for adequacy                         to receive sedatives. The heart rate, respiratory                         rate, oxygen saturations, blood pressure, adequacy of                         pulmonary ventilation, and response to care were                         monitored throughout the procedure. The physical                         status of the patient was re-assessed after the                         procedure.                        After obtaining informed consent, the colonoscope was                         passed under direct vision. Throughout the procedure,                         the patient's blood pressure, pulse, and oxygen                         saturations were monitored continuously. The  Colonoscope was introduced through the anus and                         advanced to the the cecum, identified by appendiceal                         orifice and ileocecal valve. The colonoscopy was                         performed without difficulty. The patient tolerated                         the procedure well. The quality of the bowel                         preparation was fair. The ileocecal valve, appendiceal                         orifice, and rectum were photographed. Findings:      The perianal and digital rectal examinations were normal.      A 2 mm polyp was found in the cecum. The polyp was sessile. The polyp       was removed with a jumbo cold forceps. Resection and  retrieval were       complete. Estimated blood loss was minimal.      A 3 mm polyp was found in the hepatic flexure. The polyp was sessile.       The polyp was removed with a cold snare. Resection and retrieval were       complete. Estimated blood loss was minimal.      A 3 mm polyp was found in the transverse colon. The polyp was sessile.       The polyp was removed with a cold snare. Resection and retrieval were       complete. Estimated blood loss was minimal.      Internal hemorrhoids were found during retroflexion and during       endoscopy. The hemorrhoids were Grade I (internal hemorrhoids that do       not prolapse).      A small anal fissure was found in the anal canal.      The exam was otherwise without abnormality on direct and retroflexion       views. Impression:            - Preparation of the colon was fair.                        - One 2 mm polyp in the cecum, removed with a jumbo                         cold forceps. Resected and retrieved.                        - One 3 mm polyp at the hepatic flexure, removed with                         a cold snare. Resected and retrieved.                        - One  3 mm polyp in the transverse colon, removed with                         a cold snare. Resected and retrieved.                        - Internal hemorrhoids.                        - Anal fissure.                        - The examination was otherwise normal on direct and                         retroflexion views. Recommendation:        - Discharge patient to home.                        - Resume previous diet.                        - Continue present medications.                        - Await pathology results.                        - Repeat colonoscopy because the bowel preparation was                         suboptimal in two years.                        - Return to referring physician as previously                         scheduled. Procedure Code(s):      --- Professional ---                        [phone], Colonoscopy, flexible; with removal of                         tumor(s), polyp(s), or other lesion(s) by snare                         technique                        45380, 59, Colonoscopy, flexible; with biopsy, single                         or multiple Diagnosis Code(s):     --- Professional ---                        K64.0, First degree hemorrhoids                        D12.0, Benign neoplasm of cecum                        D12.3, Benign neoplasm of transverse  colon (hepatic                         flexure or splenic flexure)                        K60.2, Anal fissure, unspecified                        K62.5, Hemorrhage of anus and rectum CPT copyright 2022 [employer]. All rights reserved. The codes documented in this report are preliminary and upon coder review may  be revised to meet current compliance requirements. Eather Colas MD, MD 08/02/2022 2:17:50 PM Number of Addenda: 0 Note Initiated On: 08/02/2022 1:40 PM Scope Withdrawal Time: 0 hours 11 minutes 58 seconds  Total Procedure Duration: 0 hours 18 minutes 8 seconds  Estimated Blood Loss:  Estimated blood loss was minimal.      [facility]

## 2022-08-02 NOTE — H&P (Signed)
Outpatient short stay form Pre-procedure 08/02/2022  Dean Bill, MD  Primary Physician: Lynnea Ferrier, MD  Reason for visit:  Rectal bleeding, abnormal rectal exam  History of present illness:    72 y/o gentleman with history of CAD, DM II, and hypertension here for colonoscopy due rectal bleeding and rectal mass on physical exam. No blood thinners. No family history of GI malignancies. No significant abdominal surgeries.    Current Facility-Administered Medications:    0.9 %  sodium chloride infusion, , Intravenous, Continuous, Shahan Starks, Rossie Muskrat, MD, Last Rate: 20 mL/hr at 08/02/22 1311, New Bag at 08/02/22 1311  Medications Prior to Admission  Medication Sig Dispense Refill Last Dose   ADVAIR HFA 115-21 MCG/ACT inhaler Inhale 2 puffs into the lungs in the morning and at bedtime.   08/02/2022 at 0900   amLODipine (NORVASC) 10 MG tablet Take 10 mg by mouth every morning.   08/01/2022   aspirin EC 81 MG tablet Take 81 mg by mouth daily.   Past Week   atorvastatin (LIPITOR) 80 MG tablet Take 40 mg by mouth at bedtime.   08/01/2022   cloNIDine (CATAPRES) 0.1 MG tablet Take 0.1 mg by mouth 2 (two) times daily.   08/02/2022   metFORMIN (GLUCOPHAGE) 500 MG tablet Take 1,000 mg by mouth 2 (two) times daily with a meal.   Past Week   metoprolol succinate (TOPROL-XL) 100 MG 24 hr tablet Take 100 mg by mouth every morning. Take with or immediately following a meal.   08/02/2022 at 0800   valsartan-hydrochlorothiazide (DIOVAN-HCT) 320-25 MG tablet Take 1 tablet by mouth every morning.   08/02/2022 at 0900   canagliflozin (INVOKANA) 300 MG TABS tablet Take 300 mg by mouth daily before breakfast.      doxazosin (CARDURA) 4 MG tablet Take 1 tablet (4 mg total) by mouth daily.      fluocinonide-emollient (LIDEX-E) 0.05 % cream Apply 1 application topically 2 (two) times daily.      gabapentin (NEURONTIN) 300 MG capsule Take 300 mg by mouth at bedtime.      glimepiride (AMARYL) 4 MG tablet Take  4 mg by mouth daily with breakfast.      hydrOXYzine (ATARAX/VISTARIL) 25 MG tablet Take 25 mg by mouth 3 (three) times daily as needed for itching.      Ipratropium-Albuterol (ALBUTEROL-IPRATROPIUM IN) Inhale 3 mLs into the lungs 2 (two) times daily.      nystatin ointment (MYCOSTATIN) Apply 1 application topically as needed.      omeprazole (PRILOSEC) 20 MG capsule Take 20 mg by mouth daily.      Roflumilast (DALIRESP) 250 MCG TABS Take 1 tablet by mouth daily at 6 (six) AM.      Semaglutide, 2 MG/DOSE, 8 MG/3ML SOPN Inject 1 Dose into the skin once a week. On Sundays   07/25/2022   triamcinolone cream (KENALOG) 0.5 % Apply 1 Application topically as needed.        Allergies  Allergen Reactions   Iodine Swelling    shellfish shellfish   Shellfish Allergy Itching and Swelling    NOT AT EVERY EXPOSURE     Past Medical History:  Diagnosis Date   Arthritis    BPH (benign prostatic hyperplasia)    COPD (chronic obstructive pulmonary disease)    Diabetes mellitus without complication    Dyspnea    with exertion   Hemorrhoids    History of tobacco abuse    Hx of colonic polyps  Hyperlipidemia    Hypertension    Microscopic hematuria    Obstructive emphysema    Proteinuria 02/25/2014   Psoriasis    Right ovarian cyst    Sleep apnea    uses Cpap    Review of systems:  Otherwise negative.    Physical Exam  Gen: Alert, oriented. Appears stated age.  HEENT: PERRLA. Lungs: No respiratory distress CV: RRR Abd: soft, benign, no masses Ext: No edema   Planned procedures: Proceed with colonoscopy. The patient understands the nature of the planned procedure, indications, risks, alternatives and potential complications including but not limited to bleeding, infection, perforation, damage to internal organs and possible oversedation/side effects from anesthesia. The patient agrees and gives consent to proceed.  Please refer to procedure notes for findings, recommendations and  patient disposition/instructions.     Dean Bill, MD [facility]

## 2022-08-03 ENCOUNTER — Encounter: Payer: Self-pay | Admitting: Gastroenterology

## 2022-08-04 LAB — SURGICAL PATHOLOGY

## 2022-10-04 ENCOUNTER — Other Ambulatory Visit: Payer: Self-pay | Admitting: Orthopedic Surgery

## 2022-10-13 ENCOUNTER — Encounter
Admission: RE | Admit: 2022-10-13 | Discharge: 2022-10-13 | Disposition: A | Discharge status: Home / Self Care | Payer: Medicare Other | Source: Ambulatory Visit | Attending: Orthopedic Surgery | Admitting: Orthopedic Surgery

## 2022-10-13 ENCOUNTER — Other Ambulatory Visit: Payer: Self-pay | Admitting: Orthopedic Surgery

## 2022-10-13 ENCOUNTER — Telehealth: Payer: Self-pay | Admitting: Urgent Care

## 2022-10-13 ENCOUNTER — Encounter: Payer: Self-pay | Admitting: Orthopedic Surgery

## 2022-10-13 VITALS — BP 126/55 | HR 63 | Temp 97.6°F | Resp 14 | Ht 69.0 in | Wt 222.0 lb

## 2022-10-13 DIAGNOSIS — Z79899 Other long term (current) drug therapy: Secondary | ICD-10-CM

## 2022-10-13 DIAGNOSIS — J449 Chronic obstructive pulmonary disease, unspecified: Secondary | ICD-10-CM | POA: Insufficient documentation

## 2022-10-13 DIAGNOSIS — Z01812 Encounter for preprocedural laboratory examination: Secondary | ICD-10-CM

## 2022-10-13 DIAGNOSIS — E119 Type 2 diabetes mellitus without complications: Secondary | ICD-10-CM | POA: Diagnosis not present

## 2022-10-13 DIAGNOSIS — E876 Hypokalemia: Secondary | ICD-10-CM

## 2022-10-13 HISTORY — DX: Ventricular premature depolarization: I49.3

## 2022-10-13 HISTORY — DX: Cervical disc disorder, unspecified, unspecified cervical region: M50.90

## 2022-10-13 HISTORY — DX: Peripheral vascular disease, unspecified: I73.9

## 2022-10-13 HISTORY — DX: Atherosclerotic heart disease of native coronary artery without angina pectoris: I25.10

## 2022-10-13 LAB — URINALYSIS, ROUTINE W REFLEX MICROSCOPIC
Bacteria, UA: NONE SEEN
Bilirubin Urine: NEGATIVE
Glucose, UA: >=500 mg/dL — AB
Hgb urine dipstick: NEGATIVE
Ketones, ur: NEGATIVE mg/dL
Leukocytes,Ua: NEGATIVE
Nitrite: NEGATIVE
Protein, ur: NEGATIVE mg/dL
Specific Gravity, Urine: 1.023 (ref 1.005–1.030)
pH: 5 (ref 5.0–8.0)

## 2022-10-13 LAB — COMPREHENSIVE METABOLIC PANEL
ALT: 24 U/L (ref 0–44)
AST: 18 U/L (ref 15–41)
Albumin: 4.1 g/dL (ref 3.5–5.0)
Alkaline Phosphatase: 61 U/L (ref 38–126)
Anion gap: 13 (ref 5–15)
BUN: 20 mg/dL (ref 8–23)
CO2: 25 mmol/L (ref 22–32)
Calcium: 9.4 mg/dL (ref 8.9–10.3)
Chloride: 98 mmol/L (ref 98–111)
Creatinine, Ser: 0.83 mg/dL (ref 0.61–1.24)
GFR, Estimated: >60 mL/min (ref >60)
Glucose, Bld: 145 mg/dL — ABNORMAL HIGH (ref 70–99)
Potassium: 3.2 mmol/L — ABNORMAL LOW (ref 3.5–5.1)
Sodium: 136 mmol/L (ref 135–145)
Total Bilirubin: 0.7 mg/dL (ref 0.3–1.2)
Total Protein: 7.3 g/dL (ref 6.5–8.1)

## 2022-10-13 LAB — CBC WITH DIFFERENTIAL/PLATELET
Abs Immature Granulocytes: 0.03 10*3/uL (ref 0.00–0.07)
Basophils Absolute: 0 10*3/uL (ref 0.0–0.1)
Basophils Relative: 0 %
Eosinophils Absolute: 0.7 10*3/uL — ABNORMAL HIGH (ref 0.0–0.5)
Eosinophils Relative: 7 %
HCT: 42.4 % (ref 39.0–52.0)
Hemoglobin: 13.7 g/dL (ref 13.0–17.0)
Immature Granulocytes: 0 %
Lymphocytes Relative: 25 %
Lymphs Abs: 2.3 10*3/uL (ref 0.7–4.0)
MCH: 27.5 pg (ref 26.0–34.0)
MCHC: 32.3 g/dL (ref 30.0–36.0)
MCV: 85 fL (ref 80.0–100.0)
Monocytes Absolute: 0.5 10*3/uL (ref 0.1–1.0)
Monocytes Relative: 6 %
Neutro Abs: 5.7 10*3/uL (ref 1.7–7.7)
Neutrophils Relative %: 62 %
Platelets: 283 10*3/uL (ref 150–400)
RBC: 4.99 MIL/uL (ref 4.22–5.81)
RDW: 13 % (ref 11.5–15.5)
WBC: 9.2 10*3/uL (ref 4.0–10.5)
nRBC: 0 % (ref 0.0–0.2)

## 2022-10-13 LAB — TYPE AND SCREEN
ABO/RH(D): A NEG
Antibody Screen: NEGATIVE

## 2022-10-13 LAB — SURGICAL PCR SCREEN
MRSA, PCR: NEGATIVE
Staphylococcus aureus: NEGATIVE

## 2022-10-13 MED ORDER — POTASSIUM CHLORIDE ER 10 MEQ PO TBCR
EXTENDED_RELEASE_TABLET | ORAL | 0 refills | Status: AC
Start: 2022-10-18 — End: 2022-10-24

## 2022-10-13 NOTE — Progress Notes (Signed)
[facility] Perioperative Services: Pre-Admission/Anesthesia Testing  Abnormal Lab Notification and Treatment Plan of Care   Date: 10/13/22  Name: Dean Simpson MRN:   [id number]  Re: Abnormal labs noted during PAT appointment   Notified:  Provider Name Provider Role Notification Mode  Reinaldo Berber, MD Orthopedics (Surgeon) Routed and/or faxed via Oswaldo Done, Viona Gilmore, MD Primary Care Provider Routed and/or faxed via [facility]   Clinical Information and Notes:  ABNORMAL LAB VALUE(S): Lab Results  Component Value Date   K 3.2 (L) 10/13/2022   Dean Simpson is scheduled for an elective LEFT TOTAL HIP ARTHROPLASTY on 10/25/2022. In review of his medication reconciliation, it is noted that the patient is taking prescribed diuretic medications (HCTZ 25 mg) daily. Additionally, patient is on albuterol containing MDI. Will minor, the albuterol could have effects of his serum K+ levels.   Please note, in efforts to promote a safe and effective anesthetic course, per current guidelines/standards set by the [facility] anesthesia team, the minimal acceptable K+ level for the patient to proceed with general anesthesia is 3.0 mmol/L. With that being said, if the patient drops any lower, his elective procedure will need to be postponed until K+ is better optimized. In efforts to prevent case cancellation, will make efforts to optimize pre-surgical K+ level so that patient can safely undergo the planned surgical intervention.   Impression and Plan:  Dean Simpson found to be HYPOkalemic at 3.2 mmol/L on preoperative labs. He is on thiazide diuretic therapy. No oral K+ supplements noted on medication list. Called patient to discuss results and plans for correction of noted electrolyte derangement. No answer. Detailed message left on machine with instructions for him to call with questions.   Meds ordered this encounter  Medications    potassium chloride (KLOR-CON) 10 MEQ tablet    Sig: Take 1 tablet (10 mEq) daily starting the week before surgery (first dose 10/18/2022). Follow up with PCP for repeat labs.    Dispense:  7 tablet    Refill:  0    Please contact patient once filled and ready for pick up. Rx for preoperative K+ optimization.   Approaching correction with both Rx and nutritional replacement. Discussed nutritional intake of K+ rich foods as an adjunctive way to keep his K+ levels normal; list of K+ rich foods verbally provided. Also mentioned ORS, however advised him not to rely solely on these drinks, as they are high in Na+, and he has a HTN diagnosis. This should be sufficient to get him safely back to a normal K+ range. Encouraged patient to follow up with PCP about 2-3 weeks postoperatively to have labs rechecked to ensure that levels are remaining within normal range.   Will send copy of this note to surgeon and PCP to make them aware of K+ level and plans for correction. Discussed that PCP may elect to pursue a change in diuretic therapy to a K+ sparing type medication, or alternatively, they may consider adding a daily K+ supplement if levels remain low on recheck. I do not anticipate this to be the case, as in review of his past labs, this is the one of the initial episodes of hypokalemia noted. I suspect that current episode of hypokalemia is multifactorial and related to nutritional intake, warmer weather (more sweating), and the aforementioned medications.   Order entered to recheck K+ on the day of his surgery to ensure optimization. Wished patient the best of luck with his upcoming surgery  and subsequent recovery. He was encouraged to return call to the PAT clinic, or to his surgeon's office, should any questions or concerns arise between now and the time of his surgery.   Encounter Diagnoses  Name Primary?   Pre-operative laboratory examination Yes   Diuretic-induced hypokalemia    Long term current use  of diuretic    Quentin Mulling, MSN, APRN, FNP-C, [facility]  Peri-operative Services Nurse Practitioner Phone: [phone] 10/13/22 3:12 PM  NOTE: This note has been prepared using Dragon dictation software. Despite my best ability to proofread, there is always the potential that unintentional transcriptional errors may still occur from this process.

## 2022-10-13 NOTE — Patient Instructions (Addendum)
Your procedure is scheduled on: Monday, October 25, 2022 Report to the Registration Desk on the 1st floor of the [facility] To find out your arrival time, please call [phone] between 1PM - 3PM on: Friday , July 5  If your arrival time is 6:00 am, do not arrive before that time as the [facility] entrance doors do not open until 6:00 am.  REMEMBER: Instructions that are not followed completely may result in serious medical risk, up to and including death; or upon the discretion of your surgeon and anesthesiologist your surgery may need to be rescheduled.  Do not eat food after midnight the night before surgery.  No gum chewing or hard candies.  You may however, drink water up to 2 hours before you are scheduled to arrive for your surgery. Do not drink anything within 2 hours of your scheduled arrival time.  One week prior to surgery: Monday July 1 Stop Anti-inflammatories (NSAIDS) such as Advil, Aleve, Ibuprofen, Motrin, Naproxen, Naprosyn and Aspirin based products such as Excedrin, Goody's Powder, BC Powder. Stop ANY OVER THE COUNTER supplements until after surgery. You may however, continue to take Tylenol if needed for pain up until the day of surgery.  Continue taking all prescribed medications with the exception of the following:  Invokana - Hold three days before surgery. Last day to take Thursday July 4. Resume after surgery. Metformin- Hold 2 days before surgery. Last day to take Friday , July 5. Resume after surgery.  Semaglutide ( Ozempic) Hold 7 days before surgery. Do not take Dose on Sunday, July 7. Resume on regular day Sunday July 14. Per cardiology note stop aspirin 5 days before surgery. Last dose Tuesday July 2. Resume per surgeons instructions.   TAKE ONLY THESE MEDICATIONS THE MORNING OF SURGERY WITH A SIP OF WATER:  Albuterol nebulizer Metoprolol Pantoprazole (Protonix)- (take one the night before and one on the morning of surgery - helps to prevent nausea  after surgery.) Roflumilast ( Daliresp)   Bring your combivent inhaler to the hospital.  No Alcohol for 24 hours before or after surgery.  No Smoking including e-cigarettes for 24 hours before surgery.  No chewable tobacco products for at least 6 hours before surgery.  No nicotine patches on the day of surgery.  Do not use any "recreational" drugs for at least a week (preferably 2 weeks) before your surgery.  Please be advised that the combination of cocaine and anesthesia may have negative outcomes, up to and including death. If you test positive for cocaine, your surgery will be cancelled.  On the morning of surgery brush your teeth with toothpaste and water, you may rinse your mouth with mouthwash if you wish. Do not swallow any toothpaste or mouthwash.  Use CHG Soap as directed on instruction sheet.  Do not wear jewelry, make-up, hairpins, clips or nail polish.  Do not wear lotions, powders, or perfumes.   Do not shave body hair from the neck down 48 hours before surgery.  Contact lenses, hearing aids and dentures may not be worn into surgery.  Do not bring valuables to the hospital. [facility] is not responsible for any missing/lost belongings or valuables.   Bring your C-PAP to the hospital in case you may have to spend the night.   Notify your doctor if there is any change in your medical condition (cold, fever, infection).  Wear comfortable clothing (specific to your surgery type) to the hospital.  After surgery, you can help prevent lung complications  by doing breathing exercises.  Take deep breaths and cough every 1-2 hours. Your doctor may order a device called an Incentive Spirometer to help you take deep breaths.  If you are being admitted to the hospital overnight, leave your suitcase in the car. After surgery it may be brought to your room.  In case of increased patient census, it may be necessary for you, the patient, to continue your postoperative care in  the Same Day Surgery department.  If you are being discharged the day of surgery, you will not be allowed to drive home. You will need a responsible individual to drive you home and stay with you for 24 hours after surgery.   If you are taking public transportation, you will need to have a responsible individual with you.  Please call the Pre-admissions Testing Dept. at [phone] if you have any questions about these instructions.  Surgery Visitation Policy:  Patients having surgery or a procedure may have two visitors.  Children under the age of 48 must have an adult with them who is not the patient.  Inpatient Visitation:    Visiting hours are 7 a.m. to 8 p.m. Up to four visitors are allowed at one time in a patient room. The visitors may rotate out with other people during the day.  One visitor age 54 or older may stay with the patient overnight and must be in the room by 8 p.m.    Pre-operative 5 CHG Bath Instructions   You can play a key role in reducing the risk of infection after surgery. Your skin needs to be as free of germs as possible. You can reduce the number of germs on your skin by washing with CHG (chlorhexidine gluconate) soap before surgery. CHG is an antiseptic soap that kills germs and continues to kill germs even after washing.   DO NOT use if you have an allergy to chlorhexidine/CHG or antibacterial soaps. If your skin becomes reddened or irritated, stop using the CHG and notify one of our RNs at [phone].   Please shower with the CHG soap starting 4 days before surgery using the following schedule:     Please keep in mind the following:  DO NOT shave, including legs and underarms, starting the day of your first shower.   You may shave your face at any point before/day of surgery.  Place clean sheets on your bed the day you start using CHG soap. Use a clean washcloth (not used since being washed) for each shower. DO NOT sleep with pets once you  start using the CHG.   CHG Shower Instructions:  If you choose to wash your hair and private area, wash first with your normal shampoo/soap.  After you use shampoo/soap, rinse your hair and body thoroughly to remove shampoo/soap residue.  Turn the water OFF and apply about 3 tablespoons (45 ml) of CHG soap to a CLEAN washcloth.  Apply CHG soap ONLY FROM YOUR NECK DOWN TO YOUR TOES (washing for 3-5 minutes)  DO NOT use CHG soap on face, private areas, open wounds, or sores.  Pay special attention to the area where your surgery is being performed.  If you are having back surgery, having someone wash your back for you may be helpful. Wait 2 minutes after CHG soap is applied, then you may rinse off the CHG soap.  Pat dry with a clean towel  Put on clean clothes/pajamas   If you choose to wear lotion, please use ONLY the  CHG-compatible lotions on the back of this paper.     Additional instructions for the day of surgery: DO NOT APPLY any lotions, deodorants, cologne, or perfumes.   Put on clean/comfortable clothes.  Brush your teeth.  Ask your nurse before applying any prescription medications to the skin.      CHG Compatible Lotions   Aveeno Moisturizing lotion  Cetaphil Moisturizing Cream  Cetaphil Moisturizing Lotion  Clairol Herbal Essence Moisturizing Lotion, Dry Skin  Clairol Herbal Essence Moisturizing Lotion, Extra Dry Skin  Clairol Herbal Essence Moisturizing Lotion, Normal Skin  Curel Age Defying Therapeutic Moisturizing Lotion with Alpha Hydroxy  Curel Extreme Care Body Lotion  Curel Soothing Hands Moisturizing Hand Lotion  Curel Therapeutic Moisturizing Cream, Fragrance-Free  Curel Therapeutic Moisturizing Lotion, Fragrance-Free  Curel Therapeutic Moisturizing Lotion, Original Formula  Eucerin Daily Replenishing Lotion  Eucerin Dry Skin Therapy Plus Alpha Hydroxy Crme  Eucerin Dry Skin Therapy Plus Alpha Hydroxy Lotion  Eucerin Original Crme  Eucerin Original  Lotion  Eucerin Plus Crme Eucerin Plus Lotion  Eucerin TriLipid Replenishing Lotion  Keri Anti-Bacterial Hand Lotion  Keri Deep Conditioning Original Lotion Dry Skin Formula Softly Scented  Keri Deep Conditioning Original Lotion, Fragrance Free Sensitive Skin Formula  Keri Lotion Fast Absorbing Fragrance Free Sensitive Skin Formula  Keri Lotion Fast Absorbing Softly Scented Dry Skin Formula  Keri Original Lotion  Keri Skin Renewal Lotion Keri Silky Smooth Lotion  Keri Silky Smooth Sensitive Skin Lotion  Nivea Body Creamy Conditioning Oil  Nivea Body Extra Enriched Lotion  Nivea Body Original Lotion  Nivea Body Sheer Moisturizing Lotion Nivea Crme  Nivea Skin Firming Lotion  NutraDerm 30 Skin Lotion  NutraDerm Skin Lotion  NutraDerm Therapeutic Skin Cream  NutraDerm Therapeutic Skin Lotion  ProShield Protective Hand Cream  Provon moisturizing lotion  Preoperative Educational Videos for Total Hip, Knee and Shoulder Replacements  To better prepare for surgery, please view our videos that explain the physical activity and discharge planning required to have the best surgical recovery at [facility].  [email or web address]  Questions? Call [phone] or email [email or web address]

## 2022-10-18 ENCOUNTER — Encounter: Payer: Self-pay | Admitting: Orthopedic Surgery

## 2022-10-18 NOTE — Progress Notes (Signed)
Perioperative / Anesthesia Services  Pre-Admission Testing Clinical Review / Preoperative Anesthesia Consult  Date: 10/22/22  Patient Demographics:  Name: Dean Simpson DOB:   03-22-1951 MRN:   [id number]  Planned Surgical Procedure(s):    Case: [id number] Date/Time: 10/25/22 1010   Procedure: TOTAL HIP ARTHROPLASTY ANTERIOR APPROACH (Left: Hip)   Anesthesia type: Choice   Pre-op diagnosis: Primary osteoarthritis of left hip   Location: [facility] OR ROOM 02 / [facility]   Surgeons: Reinaldo Berber, MD     NOTE: Available PAT nursing documentation and vital signs have been reviewed. Clinical nursing staff has updated patient's PMH/PSHx, current medication list, and drug allergies/intolerances to ensure comprehensive history available to assist in medical decision making as it pertains to the aforementioned surgical procedure and anticipated anesthetic course. Extensive review of available clinical information personally performed. [place] PMH and PSHx updated with any diagnoses/procedures that  may have been inadvertently omitted during his intake with the pre-admission testing department's nursing staff.  Clinical Discussion:  Dean Simpson is a 72 y.o. male who is submitted for pre-surgical anesthesia review and clearance prior to him undergoing the above procedure. Patient is a Former Smoker (82 pack years; quit 04/2008). Pertinent PMH includes: CAD, NSVT, aortic stenosis, diastolic dysfunction, frequent PVCs, symptomatic bradycardia, amaurosis fugax, dizziness, PVD, aortic atherosclerosis, stable angina, HTN, HLD, T2DM, COPD, DOE, OSAH (requires nocturnal PAP therapy), anemia, OA, cervical DDD, lumbar DDD (s/p L4-S1 TLIF), BPH.  Patient is followed by cardiology Darrold Junker, MD). He was last seen in the cardiology clinic on 09/20/2022; notes reviewed. At the time of his clinic visit, patient doing well overall from a cardiovascular  perspective. Patient complaining of chronic dizziness. Symptom improved since patient had stopped his clonidine. Patient scheduled to follow up with ENT for further evaluation of potential non-cardiovascular causes of his dizziness. Patient denied any chest pain, shortness of breath, PND, orthopnea, palpitations, significant peripheral edema, weakness, fatigue, or presyncope/syncope. Patient with a past medical history significant for cardiovascular diagnoses. Documented physical exam was grossly benign, providing no evidence of acute exacerbation and/or decompensation of the patient's known cardiovascular conditions.  Long term event monitor study was performed in 11/2021. Study results unavailable for review. With that said, cardiology noted that patient's study revealed episodes of nonsustained ventricular tachycardia (NSVT). Episodes were not associated with any significant vertiginous symptoms, presyncopal/syncopal events, chest pain, or shortness of breath.    Myocardial perfusion imaging study was performed on 01/11/2022 revealing a normal left ventricular systolic function with a hyperdynamic LVEF of 62%. There was normal myocardial thickening and wall motion. Homogenous tracer distribution was observed during the study. There was no evidence of stress induced myocardial ischemia or arrhythmia; no scintigraphic evidence of scar. Study determined to be normal and low risk.   Patient with a known diagnosis of aortic stenosis. Valvulopathy has been serially monitored since time of diagnosis. Most recent TTE was performed on 09/03/2022 revealing normal left ventricular systolic function with an EF of >55%, There were no regional wall motion abnormalities. Left ventricular diastolic Doppler parameters consistent with abnormal relaxation (G1DD). GLS -18.1%. Right ventricular size and function was normal. There was trivial to mind panvalvular regurgitation noted. Aortic valve mildly stenotic with a mean  transvalvular gradient of 16.3 mm/Hg; AVA (VTI) = 0.95 cm2 ; peak velocity = 2.8 m/s.   Blood pressure well controlled at 124/60 mmHg on currently prescribed alpha blocker (doxazosin), beta blocker (metoprolol succinate), CCB (amlodipine), ARB (valsartan), and diuretic (HCTZ) therapies.  Patient  is on atorvastatin for his HLD diagnosis and ASCVD prevention. T2DM well controlled on currently prescribed regimen; last HgbA1c was 6.8% when checked on 08/30/2022. In the setting of known cardiovascular diagnoses and concurrent T2DM, patient is on an SGLT2i (canagliflozin) for added cardiovascular and renovascular protection. He does have an OSAH diagnosis and is reported to be compliant with prescribed nocturnal PAP therapy. While somewhat limited by his arthritides, patient is able to complete all of his ADL/IADLs without cardiovascular limitation. Functional capacity, as defined by DASI, places the patient at being able to achieve >/= 4 METS of physical activity without experiencing any significant degree of angina/anginal equivalent symptoms. No changes were made to his medication regimen during his visit with cardiology.  Patient scheduled to follow-up with outpatient cardiology in 6 months or sooner if needed.  Dean Simpson is scheduled for an elective TOTAL HIP ARTHROPLASTY ANTERIOR APPROACH (Left: Hip) on 10/25/2022 with Dr. Reinaldo Berber, MD.  Given patient's past medical history significant for cardiovascular/cardiopulmonary diagnoses, in addition to multiple medical comorbidities, presurgical clearances were sought by the PAT team. Specialty clearances per obtained as follows:  Per internal medicine Graciela Husbands, MD), "patient is optimized for surgery. He may proceed as planned with orthopedics surgery with an overall MODERATE risk of significant complications".  Per pulmonary medicine Karna Christmas, MD), "ARISCAT [facility]) preoperative pulmonary risk index in adults- low risk: 13 % pulmonary  postoperative complication rate for atelectasis and increased O2 requirement. [place] respiratory failure index- low risk: 1.8% pulmonary postoperative complication rate. Chales Abrahams calculator for postoperative respiratory failure- low risk: 1.07% probability of postoperative respiratory failure. Patient may proceed with the planned surgical intervention at an overall MODERATE risk of complications. Please have patient use incentive spirometer after surgery. Allow patient to use his CPAP post operatively with supplemental oxygen".  Per cardiology Ann Maki, NP-C), "this patient is optimized for surgery and may proceed with the planned procedural course with a LOW risk of significant perioperative cardiovascular complications".  In review of his medication reconciliation, it is noted that patient is currently on prescribed daily antithrombotic therapy. He has been instructed on recommendations for holding his daily low-dose ASA for 5 days prior to his procedure with plans to restart as soon as postoperative bleeding risk felt to be minimized by his attending surgeon. The patient has been instructed that his last dose of his ASA should be on 10/19/2022.  Patient denies previous perioperative complications with anesthesia in the past. In review of the available records, it is noted that patient underwent a general anesthetic course here at [facility] (ASA III) in 07/2022 without documented complications.      10/13/2022    9:00 AM 08/02/2022    2:36 PM 08/02/2022    2:26 PM  Vitals with BMI  Height 5\' 9"     Weight 222 lbs    BMI 32.77    Systolic 126 122 161  Diastolic 55 59 61  Pulse 63 66 67    Providers/Specialists:   NOTE: Primary physician provider listed below. Patient may have been seen by APP or partner within same practice.   PROVIDER ROLE / SPECIALTY LAST Wilber Bihari, MD Orthopedics (Surgeon) 09/28/2022  Lynnea Ferrier, MD Primary Care  Provider 09/06/2022  Marcina Millard, MD Cardiology 09/20/2022  Vida Rigger, MD Pulmonary Medicine 06/14/2022  Verdis Frederickson, MD Endocrinology 09/09/2022   Allergies:  Iodine and Shellfish allergy  Current Home Medications:   No current facility-administered medications for this encounter.  amLODipine (NORVASC) 10 MG tablet   aspirin EC 81 MG tablet   atorvastatin (LIPITOR) 80 MG tablet   canagliflozin (INVOKANA) 300 MG TABS tablet   doxazosin (CARDURA) 4 MG tablet   gabapentin (NEURONTIN) 300 MG capsule   glimepiride (AMARYL) 4 MG tablet   hydrOXYzine (ATARAX/VISTARIL) 25 MG tablet   Ipratropium-Albuterol (ALBUTEROL-IPRATROPIUM IN)   Ipratropium-Albuterol (COMBIVENT RESPIMAT) 20-100 MCG/ACT AERS respimat   meclizine (ANTIVERT) 12.5 MG tablet   metFORMIN (GLUCOPHAGE) 1000 MG tablet   metoprolol succinate (TOPROL-XL) 100 MG 24 hr tablet   nystatin ointment (MYCOSTATIN)   pantoprazole (PROTONIX) 40 MG tablet   potassium chloride (KLOR-CON) 10 MEQ tablet   Roflumilast (DALIRESP) 250 MCG TABS   Semaglutide, 2 MG/DOSE, 8 MG/3ML SOPN   triamcinolone cream (KENALOG) 0.5 %   valsartan-hydrochlorothiazide (DIOVAN-HCT) 320-25 MG tablet   History:   Past Medical History:  Diagnosis Date   Adenomatous colon polyp    Amaurosis fugax    Anemia    Aortic atherosclerosis (HCC)    Aortic stenosis    a.) TTE 11/11/2021: mild (MPG 11; AVA 1.65); b.) TTE 01/11/2022: mild-mod (MPG 16; AVA 1.3); c.) TTE 05/147/2024: mod (MPG 16.3; AVA 0.95)   Arthritis    BPH (benign prostatic hyperplasia)    Cervical disc disease    COPD (chronic obstructive pulmonary disease) (HCC)    Coronary artery disease    DDD (degenerative disc disease), lumbar    a.) s/p open L4-S1 TLIF 10/26/2021   Diastolic dysfunction 11/11/2021   a.) TTE 11/11/2021: EF 65-70%, no RWMAs, mild AS, G1DD; b.) TTE 01/11/2022: EF >55%, mild LVH, mild MAC, mild BAE, mild RVE, triv panvalvular regurgitation, mild-mod  AS, G1DD; c.) TTE 09/03/2022: EF >55%, mild MAC, triv MR/TR/PR, mild AR, mild-mod AS, G1DD   Dizziness    DOE (dyspnea on exertion)    Frequent PVCs    GERD (gastroesophageal reflux disease)    Hemorrhoids    History of tobacco abuse    Hyperlipidemia    Hypertension    Long term current use of aspirin    Microscopic hematuria    NSVT (nonsustained ventricular tachycardia) (HCC) 11/2021   a.) noted on holter monitor in 11/2021   OSA on CPAP    Proteinuria 02/25/2014   Psoriasis    PVD (peripheral vascular disease) (HCC)    Stable angina    Symptomatic bradycardia    T2DM (type 2 diabetes mellitus) (HCC)    Past Surgical History:  Procedure Laterality Date   APPLICATION OF INTRAOPERATIVE CT SCAN N/A 10/26/2021   Procedure: APPLICATION OF INTRAOPERATIVE CT SCAN;  Surgeon: Venetia Night, MD;  Location: [facility] ORS;  Service: Neurosurgery;  Laterality: N/A;   COLONOSCOPY WITH PROPOFOL N/A 09/23/2014   Procedure: COLONOSCOPY WITH PROPOFOL;  Surgeon: Wallace Cullens, MD;  Location: [facility] ENDOSCOPY;  Service: Gastroenterology;  Laterality: N/A;   COLONOSCOPY WITH PROPOFOL N/A 03/27/2018   Procedure: COLONOSCOPY WITH PROPOFOL;  Surgeon: Scot Jun, MD;  Location[facility] END[facility]COPY;  Service: Endoscopy;  Laterality: N/A;   COLONOSCOPY WITH PROPOFOL N/A 08/02/2022   Procedure: COLONOSCOPY WITH PROPOFOL;  Surgeon: Regis Bill, MD;  Location: [facility] ENDOSCOPY;  Service: Endoscopy;  Laterality: N/A;   TOOTH EXTRACTION     TRANSFORAMINAL LUMBAR INTERBODY FUSION (TLIF) WITH PEDICLE SCREW FIXATION 2 LEVEL N/A 10/26/2021   Procedure: OPEN L4-S1 TRANSFORAMINAL LUMBAR INTERBODY FUSION (TLIF);  Surgeon: Venetia Night, MD;  Location: [facility] ORS;  Service: Neurosurgery;  Laterality: N/A;   Family History  Problem Relation Age  of Onset   Pancreatic cancer Father    Hypertension Father    Leukemia Son    Social History   Tobacco Use   Smoking status: Former    Packs/day: 2.00    Years:  41.00    Additional pack years: 0.00    Total pack years: 82.00    Types: Cigarettes    Quit date: 2010    Years since quitting: 14.5   Smokeless tobacco: Never  Vaping Use   Vaping Use: Never used  Substance Use Topics   Alcohol use: Never   Drug use: Never    Pertinent Clinical Results:  LABS:   No visits with results within 3 Day(s) from this visit.  Latest known visit with results is:  Hospital Outpatient Visit on 10/13/2022  Component Date Value Ref Range Status   ABO/RH(D) 10/13/2022 A NEG   Final   Antibody Screen 10/13/2022 NEG   Final   Sample Expiration 10/13/2022 10/27/2022,2359   Final   Extend sample reason 10/13/2022    Final                   Value:NO TRANSFUSIONS OR PREGNANCY IN THE PAST 3 MONTHS Performed at [facility], [address] [place], [place] [address]    Color, Urine 10/13/2022 YELLOW (A)  YELLOW Final   APPearance 10/13/2022 CLEAR (A)  CLEAR Final   Specific Gravity, Urine 10/13/2022 1.023  1.005 - 1.030 Final   pH 10/13/2022 5.0  5.0 - 8.0 Final   Glucose, UA 10/13/2022 >=500 (A)  NEGATIVE mg/dL Final   Hgb urine dipstick 10/13/2022 NEGATIVE  NEGATIVE Final   Bilirubin Urine 10/13/2022 NEGATIVE  NEGATIVE Final   Ketones, ur 10/13/2022 NEGATIVE  NEGATIVE mg/dL Final   Protein, ur 91/47/8295 NEGATIVE  NEGATIVE mg/dL Final   Nitrite 62/13/0865 NEGATIVE  NEGATIVE Final   Leukocytes,Ua 10/13/2022 NEGATIVE  NEGATIVE Final   RBC / HPF 10/13/2022 0-5  0 - 5 RBC/hpf Final   WBC, UA 10/13/2022 0-5  0 - 5 WBC/hpf Final   Bacteria, UA 10/13/2022 NONE SEEN  NONE SEEN Final   Squamous Epithelial / HPF 10/13/2022 0-5  0 - 5 /HPF Final   Performed at [facility], [address] [place], [place] [address]   Sodium 10/13/2022 136  135 - 145 mmol/L Final   Potassium 10/13/2022 3.2 (L)  3.5 - 5.1 mmol/L Final   Chloride 10/13/2022 98  98 - 111 mmol/L Final   CO2 10/13/2022 25  22 - 32 mmol/L Final   Glucose, Bld 10/13/2022 145 (H)  70  - 99 mg/dL Final   Glucose reference range applies only to samples taken after fasting for at least 8 hours.   BUN 10/13/2022 20  8 - 23 mg/dL Final   Creatinine, Ser 10/13/2022 0.83  0.61 - 1.24 mg/dL Final   Calcium 62/95/2841 9.4  8.9 - 10.3 mg/dL Final   Total Protein 32/44/0102 7.3  6.5 - 8.1 g/dL Final   Albumin 72/53/6644 4.1  3.5 - 5.0 g/dL Final   AST 03/47/4259 18  15 - 41 U/L Final   ALT 10/13/2022 24  0 - 44 U/L Final   Alkaline Phosphatase 10/13/2022 61  38 - 126 U/L Final   Total Bilirubin 10/13/2022 0.7  0.3 - 1.2 mg/dL Final   GFR, Estimated 10/13/2022 >60  >60 mL/min Final   Comment: (NOTE) Calculated using the CKD-EPI Creatinine Equation (2021)    Anion gap 10/13/2022 13  5 -  15 Final   Performed at [facility], [address] [place], [place] [address]   WBC 10/13/2022 9.2  4.0 - 10.5 K/uL Final   RBC 10/13/2022 4.99  4.22 - 5.81 MIL/uL Final   Hemoglobin 10/13/2022 13.7  13.0 - 17.0 g/dL Final   HCT 19/14/7829 42.4  39.0 - 52.0 % Final   MCV 10/13/2022 85.0  80.0 - 100.0 fL Final   MCH 10/13/2022 27.5  26.0 - 34.0 pg Final   MCHC 10/13/2022 32.3  30.0 - 36.0 g/dL Final   RDW 56/21/3086 13.0  11.5 - 15.5 % Final   Platelets 10/13/2022 283  150 - 400 K/uL Final   nRBC 10/13/2022 0.0  0.0 - 0.2 % Final   Neutrophils Relative % 10/13/2022 62  % Final   Neutro Abs 10/13/2022 5.7  1.7 - 7.7 K/uL Final   Lymphocytes Relative 10/13/2022 25  % Final   Lymphs Abs 10/13/2022 2.3  0.7 - 4.0 K/uL Final   Monocytes Relative 10/13/2022 6  % Final   Monocytes Absolute 10/13/2022 0.5  0.1 - 1.0 K/uL Final   Eosinophils Relative 10/13/2022 7  % Final   Eosinophils Absolute 10/13/2022 0.7 (H)  0.0 - 0.5 K/uL Final   Basophils Relative 10/13/2022 0  % Final   Basophils Absolute 10/13/2022 0.0  0.0 - 0.1 K/uL Final   Immature Granulocytes 10/13/2022 0  % Final   Abs Immature Granulocytes 10/13/2022 0.03  0.00 - 0.07 K/uL Final   Performed at [facility],  [address] [place], [place] [address]   MRSA, PCR 10/13/2022 NEGATIVE  NEGATIVE Final   Staphylococcus aureus 10/13/2022 NEGATIVE  NEGATIVE Final   Comment: (NOTE) The Xpert SA Assay (FDA approved for NASAL specimens in patients 16 years of age and older), is one component of a comprehensive surveillance program. It is not intended to diagnose infection nor to guide or monitor treatment. Performed at [facility], [address] [place], [place] [address]     ECG: Date: 08/23/2022 Time ECG obtained: 1120 AM Rate: 76 bpm Rhythm: normal sinus Axis (leads I and aVF): Normal Intervals: PR 162 ms. QRS 92 ms. QTc 450 ms. ST segment and T wave changes: No evidence of acute ST segment elevation or depression. Evidence of an age undetermined anterior infarct present.  Comparison: Similar to previous tracing obtained on 11/10/2021.   IMAGING / PROCEDURES: DIAGNOSTIC RADIOGRAPHS OF LEFT HIP 2 OR 3 VIEWS WITH OR WITHOUT PELVIS performed on 09/28/2022 Severe degenerative changes with joint space narrowing and osteophyte formation, subchondral cysts and sclerosis of the left hip.  The right hip is noted to have mild degenerative changes with some superior acetabular sclerosis.   There are extensive vascular calcifications of both femoral arteries. No fractures or dislocations noted.   TRANSTHORACIC ECHOCARDIOGRAM performed on 09/03/2022 Normal left ventricular systolic function with an EF of >55% No regional wall motion abnormalities Left ventricular diastolic Doppler parameters consistent with abnormal relaxation (G1DD). Normal right ventricular size and function Mild mitral annular calcification Trivial mitral, tricuspid, and pulmonary valve regurgitation Mild aortic valve regurgitation Mild aortic stenosis; mean gradient 16.3 mmHg; AVA (VTI) = 0.95 cm; max velocity 2.8 m/s  DG LUMBAR SPINE 2-3 VIEWS performed on 07/22/2022 Stable hardware fixation from L4 through S1.   No evidence of hardware failure.  Mild degenerative changes. Diffuse colonic stool   PULMONARY FUNCTION TESTING performed on 06/14/2022 SPIROMETRY: FVC was 4.73 liters, 119% of predicted FEV1 was 2.17, 72% of predicted  FEV1 ratio was 45.94 FEF 25-75% liters per second was 24% of predicted LUNG VOLUMES: TLC was 114% of predicted RV was 123% of predicted DIFFUSION CAPACITY: DLCO was 93% of predicted DLCO/VA was 79% of predicted   MYOCARDIAL PERFUSION IMAGING STUDY (LEXISCAN) performed on 01/11/2022 Normal left ventricular systolic function with a normal LVEF of 62% Normal myocardial thickening and wall motion Left ventricular cavity size normal SPECT images demonstrate homogenous tracer distribution throughout the myocardium No evidence of stress-induced myocardial ischemia or arrhythmia Normal low risk study  CT ANGIO CHEST PE W AND/OR WO CONTRAST performed on 11/10/2021 No evidence of pulmonary embolism. 15 mm hyperdense lesion in the right upper kidney, previously 12 mm, indeterminate. Given the small size, consider follow-up MRI abdomen with/without contrast in 3-6 months. Aortic atherosclerosis Emphysema  Impression and Plan:  Dean Simpson has been referred for pre-anesthesia review and clearance prior to him undergoing the planned anesthetic and procedural courses. Available labs, pertinent testing, and imaging results were personally reviewed by me in preparation for upcoming operative/procedural course. [facility] medical record has been updated following extensive record review and patient interview with PAT staff.   This patient has been appropriately cleared by internal medicine (MODERATE), pulmonary medicine (MODERATE), and cardiology (LOW) with the individually indicated risks of significant perioperative complications. Based on clinical review performed today (10/22/22), barring any significant acute changes in the patient's overall condition, it is  anticipated that he will be able to proceed with the planned surgical intervention. Any acute changes in clinical condition may necessitate his procedure being postponed and/or cancelled. Patient will meet with anesthesia team (MD and/or CRNA) on the day of his procedure for preoperative evaluation/assessment. Questions regarding anesthetic course will be fielded at that time.   Pre-surgical instructions were reviewed with the patient during his PAT appointment, and questions were fielded to satisfaction by PAT clinical staff. He has been instructed on which medications that he will need to hold prior to surgery, as well as the ones that have been deemed safe/appropriate to take on the day of his procedure. As part of the general education provided by PAT, patient made aware both verbally and in writing, that he would need to abstain from the use of any illegal substances during his perioperative course.  He was advised that failure to follow the provided instructions could necessitate case cancellation or result in serious perioperative complications up to and including death. Patient encouraged to contact PAT and/or his surgeon's office to discuss any questions or concerns that may arise prior to surgery; verbalized understanding.   Quentin Mulling, MSN, APRN, FNP-C, CEN [facility]  Peri-operative Services Nurse Practitioner Phone: [phone] Fax: [phone] 10/22/22 10:32 AM  NOTE: This note has been prepared using Dragon dictation software. Despite my best ability to proofread, there is always the potential that unintentional transcriptional errors may still occur from this process.

## 2022-10-24 MED ORDER — ORAL CARE MOUTH RINSE: 15.0000 mL | Freq: Once | OROMUCOSAL | Status: AC

## 2022-10-24 MED ORDER — DEXAMETHASONE SODIUM PHOSPHATE 10 MG/ML IJ SOLN
8.0000 mg | Freq: Once | INTRAMUSCULAR | Status: AC
  Administered 2022-10-25: 10 mg via INTRAVENOUS

## 2022-10-24 MED ORDER — SODIUM CHLORIDE 0.9 % IV SOLN: INTRAVENOUS | Status: DC

## 2022-10-24 MED ORDER — TRANEXAMIC ACID-NACL 1000-0.7 MG/100ML-% IV SOLN
1000.0000 mg | INTRAVENOUS | Status: AC
  Administered 2022-10-25 (×2): 1000 mg via INTRAVENOUS

## 2022-10-24 MED ORDER — CEFAZOLIN SODIUM-DEXTROSE 2-4 GM/100ML-% IV SOLN
2.0000 g | INTRAVENOUS | Status: AC
  Administered 2022-10-25: 2 g via INTRAVENOUS

## 2022-10-24 MED ORDER — CHLORHEXIDINE GLUCONATE 0.12 % MT SOLN
15.0000 mL | Freq: Once | OROMUCOSAL | Status: AC
  Administered 2022-10-25: 15 mL via OROMUCOSAL

## 2022-10-25 ENCOUNTER — Ambulatory Visit: Payer: Medicare Other | Admitting: Urgent Care

## 2022-10-25 ENCOUNTER — Encounter
Admission: AD | Disposition: A | Discharge status: Home Health | Payer: Self-pay | Source: Home / Self Care | Attending: Orthopedic Surgery

## 2022-10-25 ENCOUNTER — Encounter: Payer: Self-pay | Admitting: Orthopedic Surgery

## 2022-10-25 ENCOUNTER — Ambulatory Visit: Payer: Medicare Other

## 2022-10-25 ENCOUNTER — Other Ambulatory Visit: Payer: Self-pay

## 2022-10-25 ENCOUNTER — Observation Stay
Admission: AD | Admit: 2022-10-25 | Discharge: 2022-10-26 | Disposition: A | Discharge status: Home Health | Payer: Medicare Other | Attending: Orthopedic Surgery | Admitting: Orthopedic Surgery

## 2022-10-25 DIAGNOSIS — I2510 Atherosclerotic heart disease of native coronary artery without angina pectoris: Secondary | ICD-10-CM | POA: Insufficient documentation

## 2022-10-25 DIAGNOSIS — J449 Chronic obstructive pulmonary disease, unspecified: Secondary | ICD-10-CM | POA: Diagnosis not present

## 2022-10-25 DIAGNOSIS — E876 Hypokalemia: Secondary | ICD-10-CM

## 2022-10-25 DIAGNOSIS — E119 Type 2 diabetes mellitus without complications: Secondary | ICD-10-CM | POA: Insufficient documentation

## 2022-10-25 DIAGNOSIS — M1612 Unilateral primary osteoarthritis, left hip: Principal | ICD-10-CM | POA: Diagnosis present

## 2022-10-25 DIAGNOSIS — Z01812 Encounter for preprocedural laboratory examination: Secondary | ICD-10-CM

## 2022-10-25 DIAGNOSIS — I10 Essential (primary) hypertension: Secondary | ICD-10-CM | POA: Insufficient documentation

## 2022-10-25 DIAGNOSIS — Z79899 Other long term (current) drug therapy: Secondary | ICD-10-CM | POA: Insufficient documentation

## 2022-10-25 DIAGNOSIS — Z7982 Long term (current) use of aspirin: Secondary | ICD-10-CM | POA: Diagnosis not present

## 2022-10-25 DIAGNOSIS — Z7984 Long term (current) use of oral hypoglycemic drugs: Secondary | ICD-10-CM | POA: Insufficient documentation

## 2022-10-25 HISTORY — DX: Nonrheumatic aortic (valve) stenosis: I35.0

## 2022-10-25 HISTORY — DX: Other intervertebral disc degeneration, lumbar region: M51.36

## 2022-10-25 HISTORY — PX: TOTAL HIP ARTHROPLASTY: SHX124

## 2022-10-25 HISTORY — DX: Atherosclerosis of aorta: I70.0

## 2022-10-25 HISTORY — DX: Benign neoplasm of colon, unspecified: D12.6

## 2022-10-25 HISTORY — DX: Gastro-esophageal reflux disease without esophagitis: K21.9

## 2022-10-25 HISTORY — DX: Other forms of dyspnea: R06.09

## 2022-10-25 HISTORY — DX: Obstructive sleep apnea (adult) (pediatric): G47.33

## 2022-10-25 HISTORY — DX: Amaurosis fugax: G45.3

## 2022-10-25 HISTORY — DX: Bradycardia, unspecified: R00.1

## 2022-10-25 HISTORY — DX: Type 2 diabetes mellitus without complications: E11.9

## 2022-10-25 HISTORY — DX: Long term (current) use of aspirin: Z79.82

## 2022-10-25 HISTORY — DX: Other forms of angina pectoris: I20.89

## 2022-10-25 HISTORY — DX: Anemia, unspecified: D64.9

## 2022-10-25 HISTORY — DX: Dizziness and giddiness: R42

## 2022-10-25 HISTORY — DX: Other intervertebral disc degeneration, lumbar region without mention of lumbar back pain or lower extremity pain: M51.369

## 2022-10-25 LAB — GLUCOSE, CAPILLARY
Glucose-Capillary: 141 mg/dL — ABNORMAL HIGH (ref 70–99)
Glucose-Capillary: 306 mg/dL — ABNORMAL HIGH (ref 70–99)
Glucose-Capillary: 348 mg/dL — ABNORMAL HIGH (ref 70–99)

## 2022-10-25 LAB — POCT I-STAT, CHEM 8
BUN: 19 mg/dL (ref 8–23)
Calcium, Ion: 1.16 mmol/L (ref 1.15–1.40)
Chloride: 100 mmol/L (ref 98–111)
Creatinine, Ser: 0.8 mg/dL (ref 0.61–1.24)
Glucose, Bld: 158 mg/dL — ABNORMAL HIGH (ref 70–99)
HCT: 44 % (ref 39.0–52.0)
Hemoglobin: 15 g/dL (ref 13.0–17.0)
Potassium: 3.9 mmol/L (ref 3.5–5.1)
Sodium: 138 mmol/L (ref 135–145)
TCO2: 28 mmol/L (ref 22–32)

## 2022-10-25 SURGERY — ARTHROPLASTY, HIP, TOTAL, ANTERIOR APPROACH
Anesthesia: Spinal | Site: Hip | Laterality: Left

## 2022-10-25 MED ORDER — OXYCODONE HCL 5 MG/5ML PO SOLN: 5.0000 mg | Freq: Once | ORAL | Status: DC | PRN

## 2022-10-25 MED ORDER — CEFAZOLIN SODIUM-DEXTROSE 2-4 GM/100ML-% IV SOLN
INTRAVENOUS | Status: AC
  Filled 2022-10-25: qty 100

## 2022-10-25 MED ORDER — BUPIVACAINE LIPOSOME 1.3 % IJ SUSP
INTRAMUSCULAR | Status: AC
  Filled 2022-10-25: qty 20

## 2022-10-25 MED ORDER — ONDANSETRON HCL 4 MG/2ML IJ SOLN: 4.0000 mg | Freq: Four times a day (QID) | INTRAMUSCULAR | Status: DC | PRN

## 2022-10-25 MED ORDER — PHENYLEPHRINE HCL-NACL 20-0.9 MG/250ML-% IV SOLN
INTRAVENOUS | Status: DC | PRN
  Administered 2022-10-25: 50 ug/min via INTRAVENOUS

## 2022-10-25 MED ORDER — MECLIZINE HCL 25 MG PO TABS: 12.5000 mg | ORAL_TABLET | Freq: Three times a day (TID) | ORAL | Status: DC | PRN

## 2022-10-25 MED ORDER — VALSARTAN-HYDROCHLOROTHIAZIDE 320-25 MG PO TABS: 1.0000 | ORAL_TABLET | ORAL | Status: DC

## 2022-10-25 MED ORDER — DEXAMETHASONE SODIUM PHOSPHATE 10 MG/ML IJ SOLN
INTRAMUSCULAR | Status: AC
  Filled 2022-10-25: qty 1

## 2022-10-25 MED ORDER — SODIUM CHLORIDE (PF) 0.9 % IJ SOLN
INTRAMUSCULAR | Status: DC | PRN
  Administered 2022-10-25: 50 mL

## 2022-10-25 MED ORDER — PHENYLEPHRINE HCL (PRESSORS) 10 MG/ML IV SOLN
INTRAVENOUS | Status: DC | PRN
  Administered 2022-10-25 (×3): 160 ug via INTRAVENOUS

## 2022-10-25 MED ORDER — IPRATROPIUM-ALBUTEROL 0.5-2.5 (3) MG/3ML IN SOLN
RESPIRATORY_TRACT | Status: AC
  Filled 2022-10-25: qty 3

## 2022-10-25 MED ORDER — AMLODIPINE BESYLATE 5 MG PO TABS
ORAL_TABLET | ORAL | Status: AC
  Filled 2022-10-25: qty 2

## 2022-10-25 MED ORDER — PROPOFOL 500 MG/50ML IV EMUL
INTRAVENOUS | Status: DC | PRN
  Administered 2022-10-25: 50 ug/kg/min via INTRAVENOUS

## 2022-10-25 MED ORDER — GABAPENTIN 300 MG PO CAPS
ORAL_CAPSULE | ORAL | Status: AC
  Filled 2022-10-25: qty 1

## 2022-10-25 MED ORDER — INSULIN ASPART 100 UNIT/ML IJ SOLN
0.0000 [IU] | Freq: Three times a day (TID) | INTRAMUSCULAR | Status: DC
  Administered 2022-10-25: 11 [IU] via SUBCUTANEOUS
  Administered 2022-10-26: 3 [IU] via SUBCUTANEOUS

## 2022-10-25 MED ORDER — MIDAZOLAM HCL 5 MG/5ML IJ SOLN
INTRAMUSCULAR | Status: DC | PRN
  Administered 2022-10-25: 1 mg via INTRAVENOUS

## 2022-10-25 MED ORDER — PHENYLEPHRINE HCL-NACL 20-0.9 MG/250ML-% IV SOLN
INTRAVENOUS | Status: AC
  Filled 2022-10-25: qty 250

## 2022-10-25 MED ORDER — EPHEDRINE 5 MG/ML INJ
INTRAVENOUS | Status: AC
  Filled 2022-10-25: qty 5

## 2022-10-25 MED ORDER — IRRISEPT - 450ML BOTTLE WITH 0.05% CHG IN STERILE WATER, USP 99.95% OPTIME
TOPICAL | Status: DC | PRN
  Administered 2022-10-25: 450 mL

## 2022-10-25 MED ORDER — SURGIFLO WITH THROMBIN (HEMOSTATIC MATRIX KIT) OPTIME
TOPICAL | Status: DC | PRN
  Administered 2022-10-25: 1 via TOPICAL

## 2022-10-25 MED ORDER — KETOROLAC TROMETHAMINE 15 MG/ML IJ SOLN
INTRAMUSCULAR | Status: AC
  Filled 2022-10-25: qty 1

## 2022-10-25 MED ORDER — DOXAZOSIN MESYLATE 4 MG PO TABS
4.0000 mg | ORAL_TABLET | Freq: Every day | ORAL | Status: DC
  Administered 2022-10-25: 4 mg via ORAL
  Filled 2022-10-25: qty 1

## 2022-10-25 MED ORDER — TRAMADOL HCL 50 MG PO TABS: 50.0000 mg | ORAL_TABLET | Freq: Four times a day (QID) | ORAL | Status: DC | PRN

## 2022-10-25 MED ORDER — HYDROCODONE-ACETAMINOPHEN 5-325 MG PO TABS
ORAL_TABLET | ORAL | Status: AC
  Filled 2022-10-25: qty 1

## 2022-10-25 MED ORDER — PANTOPRAZOLE SODIUM 40 MG PO TBEC
40.0000 mg | DELAYED_RELEASE_TABLET | Freq: Every day | ORAL | Status: DC
  Administered 2022-10-25 – 2022-10-26 (×2): 40 mg via ORAL

## 2022-10-25 MED ORDER — ONDANSETRON HCL 4 MG PO TABS: 4.0000 mg | ORAL_TABLET | Freq: Four times a day (QID) | ORAL | Status: DC | PRN

## 2022-10-25 MED ORDER — OXYCODONE HCL 5 MG PO TABS: 5.0000 mg | ORAL_TABLET | Freq: Once | ORAL | Status: DC | PRN

## 2022-10-25 MED ORDER — EPHEDRINE SULFATE (PRESSORS) 50 MG/ML IJ SOLN
INTRAMUSCULAR | Status: DC | PRN
  Administered 2022-10-25: 5 mg via INTRAVENOUS
  Administered 2022-10-25: 10 mg via INTRAVENOUS

## 2022-10-25 MED ORDER — ASPIRIN 81 MG PO CHEW
CHEWABLE_TABLET | ORAL | Status: AC
  Filled 2022-10-25: qty 1

## 2022-10-25 MED ORDER — PHENOL 1.4 % MT LIQD: 1.0000 | OROMUCOSAL | Status: DC | PRN

## 2022-10-25 MED ORDER — DOCUSATE SODIUM 100 MG PO CAPS
ORAL_CAPSULE | ORAL | Status: AC
  Filled 2022-10-25: qty 1

## 2022-10-25 MED ORDER — MENTHOL 3 MG MT LOZG: 1.0000 | LOZENGE | OROMUCOSAL | Status: DC | PRN

## 2022-10-25 MED ORDER — ACETAMINOPHEN 500 MG PO TABS
ORAL_TABLET | ORAL | Status: AC
  Filled 2022-10-25: qty 2

## 2022-10-25 MED ORDER — 0.9 % SODIUM CHLORIDE (POUR BTL) OPTIME
TOPICAL | Status: DC | PRN
  Administered 2022-10-25: 1000 mL

## 2022-10-25 MED ORDER — INSULIN ASPART 100 UNIT/ML IJ SOLN
0.0000 [IU] | Freq: Every day | INTRAMUSCULAR | Status: DC
  Administered 2022-10-25: 4 [IU] via SUBCUTANEOUS

## 2022-10-25 MED ORDER — EPINEPHRINE PF 1 MG/ML IJ SOLN
INTRAMUSCULAR | Status: AC
  Filled 2022-10-25: qty 1

## 2022-10-25 MED ORDER — ACETAMINOPHEN 500 MG PO TABS
1000.0000 mg | ORAL_TABLET | Freq: Three times a day (TID) | ORAL | Status: DC
  Administered 2022-10-25 – 2022-10-26 (×3): 1000 mg via ORAL

## 2022-10-25 MED ORDER — KETOROLAC TROMETHAMINE 15 MG/ML IJ SOLN
7.5000 mg | Freq: Four times a day (QID) | INTRAMUSCULAR | Status: AC
  Administered 2022-10-25 – 2022-10-26 (×4): 7.5 mg via INTRAVENOUS

## 2022-10-25 MED ORDER — INSULIN ASPART 100 UNIT/ML IJ SOLN
INTRAMUSCULAR | Status: AC
  Filled 2022-10-25: qty 1

## 2022-10-25 MED ORDER — HYDROXYZINE HCL 25 MG PO TABS: 25.0000 mg | ORAL_TABLET | Freq: Three times a day (TID) | ORAL | Status: DC | PRN

## 2022-10-25 MED ORDER — CHLORHEXIDINE GLUCONATE 0.12 % MT SOLN
OROMUCOSAL | Status: AC
  Filled 2022-10-25: qty 15

## 2022-10-25 MED ORDER — FENTANYL CITRATE (PF) 100 MCG/2ML IJ SOLN
INTRAMUSCULAR | Status: AC
  Filled 2022-10-25: qty 2

## 2022-10-25 MED ORDER — GABAPENTIN 300 MG PO CAPS
300.0000 mg | ORAL_CAPSULE | Freq: Every day | ORAL | Status: DC
  Administered 2022-10-25: 300 mg via ORAL

## 2022-10-25 MED ORDER — SODIUM CHLORIDE FLUSH 0.9 % IV SOLN
INTRAVENOUS | Status: AC
  Filled 2022-10-25: qty 10

## 2022-10-25 MED ORDER — BUPIVACAINE HCL (PF) 0.5 % IJ SOLN
INTRAMUSCULAR | Status: DC | PRN
  Administered 2022-10-25: 3 mL

## 2022-10-25 MED ORDER — ATORVASTATIN CALCIUM 20 MG PO TABS
ORAL_TABLET | ORAL | Status: AC
  Filled 2022-10-25: qty 1

## 2022-10-25 MED ORDER — PANTOPRAZOLE SODIUM 40 MG PO TBEC
DELAYED_RELEASE_TABLET | ORAL | Status: AC
  Filled 2022-10-25: qty 1

## 2022-10-25 MED ORDER — FENTANYL CITRATE (PF) 100 MCG/2ML IJ SOLN: 25.0000 ug | INTRAMUSCULAR | Status: DC | PRN

## 2022-10-25 MED ORDER — IRBESARTAN 150 MG PO TABS
300.0000 mg | ORAL_TABLET | Freq: Every day | ORAL | Status: DC
  Administered 2022-10-26: 300 mg via ORAL
  Filled 2022-10-25: qty 2

## 2022-10-25 MED ORDER — METOCLOPRAMIDE HCL 5 MG/ML IJ SOLN: 5.0000 mg | Freq: Three times a day (TID) | INTRAMUSCULAR | Status: DC | PRN

## 2022-10-25 MED ORDER — PROPOFOL 1000 MG/100ML IV EMUL
INTRAVENOUS | Status: AC
  Filled 2022-10-25: qty 100

## 2022-10-25 MED ORDER — FENTANYL CITRATE (PF) 100 MCG/2ML IJ SOLN
INTRAMUSCULAR | Status: DC | PRN
  Administered 2022-10-25: 50 ug via INTRAVENOUS

## 2022-10-25 MED ORDER — AMLODIPINE BESYLATE 5 MG PO TABS
10.0000 mg | ORAL_TABLET | Freq: Every day | ORAL | Status: DC
  Administered 2022-10-25: 10 mg via ORAL

## 2022-10-25 MED ORDER — ROFLUMILAST 250 MCG PO TABS
1.0000 | ORAL_TABLET | Freq: Every day | ORAL | Status: DC
  Filled 2022-10-25 (×2): qty 1

## 2022-10-25 MED ORDER — IPRATROPIUM-ALBUTEROL 0.5-2.5 (3) MG/3ML IN SOLN
3.0000 mL | Freq: Four times a day (QID) | RESPIRATORY_TRACT | Status: DC
  Administered 2022-10-25 – 2022-10-26 (×4): 3 mL via RESPIRATORY_TRACT

## 2022-10-25 MED ORDER — DOCUSATE SODIUM 100 MG PO CAPS
100.0000 mg | ORAL_CAPSULE | Freq: Two times a day (BID) | ORAL | Status: DC
  Administered 2022-10-25 – 2022-10-26 (×2): 100 mg via ORAL

## 2022-10-25 MED ORDER — SODIUM CHLORIDE 0.9 % IR SOLN
Status: DC | PRN
  Administered 2022-10-25: 100 mL

## 2022-10-25 MED ORDER — TRANEXAMIC ACID-NACL 1000-0.7 MG/100ML-% IV SOLN
INTRAVENOUS | Status: AC
  Filled 2022-10-25: qty 100

## 2022-10-25 MED ORDER — HYDROCHLOROTHIAZIDE 25 MG PO TABS
25.0000 mg | ORAL_TABLET | Freq: Every day | ORAL | Status: DC
  Administered 2022-10-26: 25 mg via ORAL

## 2022-10-25 MED ORDER — MORPHINE SULFATE (PF) 4 MG/ML IV SOLN: 0.5000 mg | INTRAVENOUS | Status: DC | PRN

## 2022-10-25 MED ORDER — METOCLOPRAMIDE HCL 10 MG PO TABS: 5.0000 mg | ORAL_TABLET | Freq: Three times a day (TID) | ORAL | Status: DC | PRN

## 2022-10-25 MED ORDER — SODIUM CHLORIDE 0.9 % IV SOLN: INTRAVENOUS | Status: DC

## 2022-10-25 MED ORDER — BUPIVACAINE HCL (PF) 0.5 % IJ SOLN
INTRAMUSCULAR | Status: AC
  Filled 2022-10-25: qty 10

## 2022-10-25 MED ORDER — CEFAZOLIN SODIUM-DEXTROSE 2-4 GM/100ML-% IV SOLN
2.0000 g | Freq: Four times a day (QID) | INTRAVENOUS | Status: AC
  Administered 2022-10-25 (×2): 2 g via INTRAVENOUS

## 2022-10-25 MED ORDER — MIDAZOLAM HCL 2 MG/2ML IJ SOLN
INTRAMUSCULAR | Status: AC
  Filled 2022-10-25: qty 2

## 2022-10-25 MED ORDER — METOPROLOL SUCCINATE ER 100 MG PO TB24
100.0000 mg | ORAL_TABLET | ORAL | Status: DC
  Administered 2022-10-26: 100 mg via ORAL
  Filled 2022-10-25 (×2): qty 1

## 2022-10-25 MED ORDER — HYDROCODONE-ACETAMINOPHEN 5-325 MG PO TABS
1.0000 | ORAL_TABLET | ORAL | Status: DC | PRN
  Administered 2022-10-25: 1 via ORAL

## 2022-10-25 MED ORDER — ASPIRIN EC 81 MG PO TBEC
81.0000 mg | DELAYED_RELEASE_TABLET | Freq: Every day | ORAL | Status: DC
  Administered 2022-10-25: 81 mg via ORAL
  Filled 2022-10-25: qty 1

## 2022-10-25 MED ORDER — ENOXAPARIN SODIUM 40 MG/0.4ML IJ SOSY
40.0000 mg | PREFILLED_SYRINGE | INTRAMUSCULAR | Status: DC
  Administered 2022-10-26: 40 mg via SUBCUTANEOUS

## 2022-10-25 MED ORDER — BUPIVACAINE HCL (PF) 0.25 % IJ SOLN
INTRAMUSCULAR | Status: AC
  Filled 2022-10-25: qty 30

## 2022-10-25 MED ORDER — GUAIFENESIN ER 600 MG PO TB12
600.0000 mg | ORAL_TABLET | Freq: Two times a day (BID) | ORAL | Status: DC
  Administered 2022-10-25 – 2022-10-26 (×2): 600 mg via ORAL
  Filled 2022-10-25 (×3): qty 1

## 2022-10-25 MED ORDER — ATORVASTATIN CALCIUM 20 MG PO TABS
40.0000 mg | ORAL_TABLET | Freq: Every day | ORAL | Status: DC
  Administered 2022-10-25: 40 mg via ORAL

## 2022-10-25 MED ORDER — TRANEXAMIC ACID 1000 MG/10ML IV SOLN
INTRAVENOUS | Status: AC
  Filled 2022-10-25: qty 20

## 2022-10-25 SURGICAL SUPPLY — 73 items
ADH SKN CLS APL DERMABOND .7 (GAUZE/BANDAGES/DRESSINGS) ×1
AGENT HMST KT MTR STRL THRMB (HEMOSTASIS) ×1
APL PRP STRL LF DISP 70% ISPRP (MISCELLANEOUS) ×1
BLADE SAGITTAL AGGR TOOTH XLG (BLADE) ×1 IMPLANT
BNDG CMPR 5X6 CHSV STRCH STRL (GAUZE/BANDAGES/DRESSINGS) ×1
BNDG COHESIVE 6X5 TAN ST LF (GAUZE/BANDAGES/DRESSINGS) ×1 IMPLANT
CHLORAPREP W/TINT 26 (MISCELLANEOUS) ×1 IMPLANT
COVER BACK TABLE REUSABLE LG (DRAPES) ×1 IMPLANT
DERMABOND ADVANCED .7 DNX12 (GAUZE/BANDAGES/DRESSINGS) ×1 IMPLANT
DRAPE 3/4 80X56 (DRAPES) ×1 IMPLANT
DRAPE C-ARM XRAY 36X54 (DRAPES) ×1 IMPLANT
DRAPE POUCH INSTRU U-SHP 10X18 (DRAPES) ×1 IMPLANT
DRAPE SURG IRRIG POUCH 19X23 (DRAPES) IMPLANT
DRSG MEPILEX SACRM 8.7X9.8 (GAUZE/BANDAGES/DRESSINGS) ×1 IMPLANT
DRSG OPSITE POSTOP 4X8 (GAUZE/BANDAGES/DRESSINGS) ×1 IMPLANT
ELECT BLADE 4.0 EZ CLEAN MEGAD (MISCELLANEOUS) ×1
ELECT REM PT RETURN 9FT ADLT (ELECTROSURGICAL) ×1
ELECTRODE BLDE 4.0 EZ CLN MEGD (MISCELLANEOUS) ×1 IMPLANT
ELECTRODE REM PT RTRN 9FT ADLT (ELECTROSURGICAL) ×1 IMPLANT
GLOVE BIO SURGEON STRL SZ8 (GLOVE) ×1 IMPLANT
GLOVE BIOGEL PI IND STRL 8 (GLOVE) ×1 IMPLANT
GLOVE PI ORTHO PRO STRL 7.5 (GLOVE) ×2 IMPLANT
GLOVE PI ORTHO PRO STRL SZ8 (GLOVE) ×2 IMPLANT
GLOVE SURG SYN 7.5 E (GLOVE) ×1 IMPLANT
GLOVE SURG SYN 7.5 PF PI (GLOVE) ×1 IMPLANT
GOWN SRG XL LVL 3 NONREINFORCE (GOWNS) ×1 IMPLANT
GOWN STRL NON-REIN TWL XL LVL3 (GOWNS) ×1
GOWN STRL REUS W/ TWL LRG LVL3 (GOWN DISPOSABLE) ×1 IMPLANT
GOWN STRL REUS W/ TWL XL LVL3 (GOWN DISPOSABLE) ×1 IMPLANT
GOWN STRL REUS W/TWL LRG LVL3 (GOWN DISPOSABLE) ×1
GOWN STRL REUS W/TWL XL LVL3 (GOWN DISPOSABLE) ×1
HANDLE YANKAUER SUCT OPEN TIP (MISCELLANEOUS) ×1 IMPLANT
HEAD CERAMIC FEMORAL 36MM (Head) IMPLANT
HOOD PEEL AWAY T7 (MISCELLANEOUS) ×2 IMPLANT
INSERT TRIDENT POLY 36 0DEG (Insert) IMPLANT
IV NS 100ML SINGLE PACK (IV SOLUTION) ×1 IMPLANT
JET LAVAGE IRRISEPT WOUND (IRRIGATION / IRRIGATOR) ×1
KIT PATIENT CARE HANA TABLE (KITS) ×1 IMPLANT
LAVAGE JET IRRISEPT WOUND (IRRIGATION / IRRIGATOR) IMPLANT
LIGHT WAVEGUIDE WIDE FLAT (MISCELLANEOUS) ×1 IMPLANT
MANIFOLD NEPTUNE II (INSTRUMENTS) ×1 IMPLANT
MARKER SKIN DUAL TIP RULER LAB (MISCELLANEOUS) ×1 IMPLANT
MAT ABSORB FLUID 56X50 GRAY (MISCELLANEOUS) ×1 IMPLANT
NDL FILTER BLUNT 18X1 1/2 (NEEDLE) ×1 IMPLANT
NDL SAFETY ECLIP 18X1.5 (MISCELLANEOUS) ×1 IMPLANT
NDL SPNL 20GX3.5 QUINCKE YW (NEEDLE) ×1 IMPLANT
NEEDLE FILTER BLUNT 18X1 1/2 (NEEDLE) ×1 IMPLANT
NEEDLE SPNL 20GX3.5 QUINCKE YW (NEEDLE) ×1 IMPLANT
NS IRRIG 500ML POUR BTL (IV SOLUTION) ×1 IMPLANT
PACK HIP COMPR (MISCELLANEOUS) ×1 IMPLANT
PAD ARMBOARD 7.5X6 YLW CONV (MISCELLANEOUS) ×1 IMPLANT
SCREW HEX LP 6.5X20 (Screw) IMPLANT
SCREW HEX LP 6.5X30 (Screw) IMPLANT
SHELL ACETABUL CLUSTER SZ 54 (Shell) IMPLANT
SLEEVE SCD COMPRESS KNEE MED (STOCKING) ×1 IMPLANT
SOLUTION IRRIG SURGIPHOR (IV SOLUTION) ×1 IMPLANT
STEM STD OFFSET SZ3 32.5 (Stem) IMPLANT
SURGIFLO W/THROMBIN 8M KIT (HEMOSTASIS) IMPLANT
SUT BONE WAX W31G (SUTURE) ×1 IMPLANT
SUT DVC 2 QUILL PDO T11 36X36 (SUTURE) ×1 IMPLANT
SUT ETHIBOND 2 V 37 (SUTURE) ×1 IMPLANT
SUT QUILL MONODERM 3-0 PS-2 (SUTURE) ×1 IMPLANT
SUT SILK 0 (SUTURE) ×1
SUT SILK 0 30XBRD TIE 6 (SUTURE) ×1 IMPLANT
SUT VIC AB 0 CT1 36 (SUTURE) ×1 IMPLANT
SUT VIC AB 2-0 CT2 27 (SUTURE) ×2 IMPLANT
SYR 30ML LL (SYRINGE) ×2 IMPLANT
SYR TB 1ML LL NO SAFETY (SYRINGE) ×1 IMPLANT
TAPE MICROFOAM 4IN (TAPE) IMPLANT
TOWEL OR 17X26 4PK STRL BLUE (TOWEL DISPOSABLE) IMPLANT
TRAP FLUID SMOKE EVACUATOR (MISCELLANEOUS) ×1 IMPLANT
WAND WEREWOLF FASTSEAL 6.0 (MISCELLANEOUS) ×1 IMPLANT
WATER STERILE IRR 1000ML POUR (IV SOLUTION) ×1 IMPLANT

## 2022-10-25 NOTE — H&P (Signed)
History of Present Illness: Dean Simpson is an 72 y.o. male who presents for evaluation and follow-up of his left hip pain. The patient has had ongoing left hip pain for a year now localized to his lateral groin and lateral hip. He has been treated with exercises, therapy, anti-inflammatory medications, and hip injections since that time. Despite all these treatments he still reports 8 out of 10 pain. The left hip was injected on 04/07/2022 and he reports near complete relief of pain for a few days and then the pain came back. He reports the pain in his left hip is limiting his function on a daily basis despite conservative treatment and is keeping him from walking and participating in activities on a daily basis. At this point he feels that his hip is not improving enough with conservative treatments including topical medications like Voltaren, Tylenol, gabapentin, Celebrex, and the injection. The patient denies fevers, chills, numbness, tingling, shortness of breath, chest pain, recent illness, or any trauma.  Patient is a non-smoker he is a controlled diabetic with an A1c of 6.8, and his BMI is 33  Past Medical History: Past Medical History: Diagnosis Date BPH (benign prostatic hyperplasia) COPD (chronic obstructive pulmonary disease) (CMS/HHS-HCC) History of colon polyps adenomatous, followed by GI History of tobacco abuse Hx of adenomatous colonic polyps 01/16/2018 Hyperlipidemia Hypertension Microscopic hematuria CT 5/07 with right cyst; Urology evaluation with Dr Lonna Cobb 6/07 Obstructive emphysema (CMS/HHS-HCC) Proteinuria 02/25/2014 Psoriasis Sleep apnea CPAP 9/06 Tubular adenoma of colon 09/19/2014 Type II or unspecified type diabetes mellitus with renal manifestations, uncontrolled(250.42) (CMS/HHS-HCC) 09/17/2013 Type II or unspecified type diabetes mellitus without mention of complication, uncontrolled  Past Surgical History: Past Surgical History: Procedure Laterality  Date COLONOSCOPY 07/25/2006 COLONOSCOPY 08/28/2009 Tubular adenoma. Repeat 5 Years. COLONOSCOPY 09/23/2014 Tubular adenoma of colon/Repeat 69yrs/PYO COLONOSCOPY 03/27/2018 Adenomatous Polyps: CBF 03/2021 Colon @ [facility] 08/02/2022 Two Ta's with a hyperplastic polyp in cecum. Fair prep so would recommend repeat colonoscopy in 1-2 years/CTL s/p lumbar fusion TOOTH EXTRACTION wisdom teeth x4  Past Family History: Family History Problem Relation Age of Onset No Known Problems Mother Pancreatic cancer Father High blood pressure (Hypertension) Father No Known Problems Brother No Known Problems Brother No Known Problems Brother No Known Problems Maternal Grandmother No Known Problems Maternal Grandfather High blood pressure (Hypertension) Paternal Grandmother No Known Problems Paternal Grandfather Leukemia Son Colon cancer Neg Hx Colon polyps Neg Hx  Medications: Current Outpatient Medications Medication Sig Dispense Refill amLODIPine (NORVASC) 10 MG tablet Take 1 tablet (10 mg total) by mouth once daily 91 tablet 3 atorvastatin (LIPITOR) 80 MG tablet Take 0.5 tablets (40 mg total) by mouth once daily 210 tablet 1 celecoxib (CELEBREX) 100 MG capsule TAKE 1 CAPSULE(100 MG) BY MOUTH TWICE DAILY (Patient not taking: Reported on 09/09/2022) 60 capsule 0 COMBIVENT RESPIMAT 20-100 mcg/actuation inhaler INHALE 2 INHALATIONS INTO THE LUNGS TWICE DAILY 4 g 3 CONTOUR NEXT EZ METER Misc Use to test blood glucose 1 each 0 CONTOUR NEXT TEST STRIPS test strip 2 (two) times daily AS DIRECTED 200 strip 4 doxazosin (CARDURA) 4 MG tablet Take 1 tablet (4 mg total) by mouth at bedtime 90 tablet 3 gabapentin (NEURONTIN) 300 MG capsule Take 1 capsule (300 mg total) by mouth at bedtime 1 po qHS 30 capsule 11 glimepiride (AMARYL) 4 MG tablet TAKE 1 TABLET(4 MG) BY MOUTH DAILY WITH BREAKFAST 90 tablet 4 hydrOXYzine (ATARAX) 25 MG tablet Take 1 tablet (25 mg total) by mouth 3 (three) times daily as needed for  Itching  50 tablet 4 INVOKANA 300 mg tablet TAKE 1 TABLET(300 MG) BY MOUTH EVERY DAY 90 tablet 3 ipratropium-albuteroL (DUO-NEB) nebulizer solution Take 3 mLs by nebulization 2 (two) times daily as needed for Wheezing for up to 360 days 540 mL 3 lancets Use 1 each once daily to test blood sugar 100 each 4 meclizine (ANTIVERT) 12.5 mg tablet Take 1 tablet (12.5 mg total) by mouth 3 (three) times daily as needed (motion sickness) 30 tablet 1 metFORMIN (GLUCOPHAGE) 1000 MG tablet TAKE 1 TABLET(1000 MG) BY MOUTH TWICE DAILY WITH MEALS 180 tablet 3 metoprolol succinate (TOPROL-XL) 100 MG XL tablet TAKE 1 TABLET(100 MG) BY MOUTH EVERY DAY 91 tablet 3 nystatin (MYCOSTATIN) 100,000 unit/gram ointment Apply topically 2 (two) times daily as needed 30 g 11 pantoprazole (PROTONIX) 40 MG DR tablet Take 1 tablet (40 mg total) by mouth once daily 30 tablet 11 roflumilast 250 mcg Tab TAKE 1 TABLET BY MOUTH EVERY DAY 90 tablet 1 semaglutide (OZEMPIC) 2 mg/dose (8 mg/3 mL) pen injector INJECT 2 MG UNDER THE SKIN EVERY 7 DAYS 3 mL 6 triamcinolone 0.5 % cream APPLY TOPICALLY TO THE AFFECTED AREA TWICE DAILY AS NEEDED FOR PSORIASIS 15 g 5 valsartan-hydroCHLOROthiazide (DIOVAN-HCT) 320-25 mg tablet Take 1 tablet by mouth once daily 90 tablet 1  No current facility-administered medications for this visit.  Allergies: Allergies Allergen Reactions Iodine Swelling shellfish Shellfish Containing Products Itching and Swelling NOT AT EVERY EXPOSURE   Visit Vitals: Vitals: 09/28/22 1453 BP: 130/64   Review of Systems: A comprehensive 14 point ROS was performed, reviewed, and the pertinent orthopaedic findings are documented in the HPI.  Physical Exam: Body mass index is 33.05 kg/m. General/Constitutional: No apparent distress: well-nourished and well developed. Lymphatic: No palpable adenopathy. Respiratory: Non-labored breathing Vascular: No edema, swelling or tenderness, except as noted in detailed  exam. Integumentary: No impressive skin lesions present, except as noted in detailed exam. Neuro/Psych: Normal mood and affect, oriented to person, place and time. Musculoskeletal: Normal, except as noted in detailed exam and in HPI.  Left hip exam  SKIN: intact SWELLING: none WARMTH: no warmth TENDERNESS: none, Stinchfield Positive ROM: 0 degrees internal rotation and 20 degrees external rotation and pain with internal rotation, localized to lateral groin; Hip Flexion 80 STRENGTH: normal GAIT: antalgic and stiff-legged STABILITY: stable to testing CREPITUS: yes LEG LENGTH DISCREPANCY: right longer by .3 cm NEUROLOGICAL EXAM: normal VASCULAR EXAM: normal LUMBAR SPINE: tenderness: no, noted healed midline incision straight leg raising sign: no motor exam: normal  The contralateral hip was examined for comparison and it showed: TENDERNESS: none ROM: normal and full STRENGTH: normal STABILITY: stable to testing  Hip Imaging : I have reviewed AP pelvis and lateral hip X-rays (3 views) taken today in the office of the left hip which reveal severe degenerative changes with joint space narrowing and osteophyte formation, subchondral cysts and sclerosis of the left hip. The right hip is noted to have mild degenerative changes with some superior acetabular sclerosis. There are extensive vascular calcifications of both femoral arteries.. No fractures or dislocations noted.  Assessment: Encounter Diagnosis Name Primary? Primary osteoarthritis of left hip Yes  Plan: Desmund is a 71 year old male who presents with left hip bone on bone arthritis. Based upon the patient's continued symptoms and failure to respond to conservative treatment, I have recommended a left total hip replacement for this patient. A long discussion took place with the patient describing what a total joint replacement is and what the procedure would entail. A hip  model, similar to the implants that will be used  during the operation, was utilized to demonstrate the implants. Choices of implant manufactures were discussed and reviewed. The ability to secure the implant utilizing cement or cementless (press fit) fixation was discussed. Anterior and posterior exposures were discussed. For this patient an appropriate approach will be anterior.  The hospitalization and post-operative care and rehabilitation were also discussed. The use of perioperative antibiotics and DVT prophylaxis were discussed. The risk, benefits and alternatives to a surgical intervention were discussed at length with the patient. The patient was also advised of risks related to the medical comorbidities and elevated body mass index (BMI). A lengthy discussion took place to review the most common complications including but not limited to: deep vein thrombosis, pulmonary embolus, heart attack, stroke, infection, wound breakdown, heterotopic ossification, dislocation, numbness, leg length in-equality, intraoperative fracture, damage to nerves, tendon,muscles, arteries or other blood vessels, death and other possible complications from anesthesia. The patient was told that we will take steps to minimize these risks by using sterile technique, antibiotics and DVT prophylaxis when appropriate and follow the patient postoperatively in the office setting to monitor progress. The possibility of recurrent pain, no improvement in pain and actual worsening of pain were also discussed with the patient. The risk of dislocation following total hip replacement was discussed and potential precautions to prevent dislocation were reviewed.  The discharge plan of care focused on the patient going home following surgery. The patient was encouraged to make the necessary arrangements to have someone stay with them when they are discharged home.  The benefits of surgery were discussed with the patient including the potential for improving the patient's current clinical  condition through operative intervention. Alternatives to surgical intervention including continued conservative management were also discussed in detail. All questions were answered to the satisfaction of the patient. The patient participated and agreed to the plan of care as well as the use of the recommended implants for their total hip replacement surgery. An information packet was given to the patient to review prior to surgery.  Patient will Received medical, cardiac, and pulmonary clearance for the surgery. Otherwise all questions were answered and the patient agrees to the above plan to proceed with preparations for a left anterior total placement.  Portions of this record have been created using Scientist, clinical (histocompatibility and immunogenetics). Dictation errors have been sought, but may not have been identified and corrected.  Reinaldo Berber MD

## 2022-10-25 NOTE — Interval H&P Note (Signed)
Patient history and physical updated. Consent reviewed including risks, benefits, and alternatives to surgery. Patient agrees with above plan to proceed with left anterior total hip arthroplasty  

## 2022-10-25 NOTE — Anesthesia Preprocedure Evaluation (Addendum)
Anesthesia Evaluation  Patient identified by MRN, date of birth, ID band Patient awake    Reviewed: Allergy & Precautions, NPO status , Patient's Chart, lab work & pertinent test results  History of Anesthesia Complications Negative for: history of anesthetic complications  Airway Mallampati: III  TM Distance: >3 FB Neck ROM: full    Dental  (+) Lower Dentures, Upper Dentures   Pulmonary sleep apnea and Continuous Positive Airway Pressure Ventilation , COPD, former smoker   Pulmonary exam normal        Cardiovascular METS: 3 - Mets hypertension, Pt. on medications and Pt. on home beta blockers + angina (Stable)  + CAD, + Peripheral Vascular Disease and + DOE (Stable)  + dysrhythmias (symptomatic bradycardia, frequent PVCs) Ventricular Tachycardia + Valvular Problems/Murmurs AS   Long term event monitor study was performed in 11/2021. Study results unavailable for review. With that said, cardiology noted that patient's study revealed episodes of nonsustained ventricular tachycardia (NSVT). Episodes were not associated with any significant vertiginous symptoms, presyncopal/syncopal events, chest pain, or shortness of breath.    Most recent TTE was performed on 09/03/2022 revealing normal left ventricular systolic function with an EF of >55%, There were no regional wall motion abnormalities. Left ventricular diastolic Doppler parameters consistent with abnormal relaxation (G1DD). GLS -18.1%. Right ventricular size and function was normal. There was trivial to mind panvalvular regurgitation noted. Aortic valve mildly stenotic with a mean transvalvular gradient of 16.3 mm/Hg; AVA (VTI) = 0.95 cm2 ; peak velocity = 2.8 m/s.       Neuro/Psych  Neuromuscular disease  negative psych ROS   GI/Hepatic Neg liver ROS,GERD  Controlled,,  Endo/Other  diabetes, Well Controlled, Type 2    Renal/GU      Musculoskeletal  (+) Arthritis ,  Osteoarthritis,    Abdominal   Peds  Hematology negative hematology ROS (+)   Anesthesia Other Findings Past Medical History: No date: Adenomatous colon polyp No date: Amaurosis fugax No date: Anemia No date: Aortic atherosclerosis (HCC) No date: Aortic stenosis     Comment:  a.) TTE 11/11/2021: mild (MPG 11; AVA 1.65); b.) TTE               01/11/2022: mild-mod (MPG 16; AVA 1.3); c.) TTE               05/147/2024: mod (MPG 16.3; AVA 0.95) No date: Arthritis No date: BPH (benign prostatic hyperplasia) No date: Cervical disc disease No date: COPD (chronic obstructive pulmonary disease) (HCC) No date: Coronary artery disease No date: DDD (degenerative disc disease), lumbar     Comment:  a.) s/p open L4-S1 TLIF 10/26/2021 11/11/2021: Diastolic dysfunction     Comment:  a.) TTE 11/11/2021: EF 65-70%, no RWMAs, mild AS, G1DD;               b.) TTE 01/11/2022: EF >55%, mild LVH, mild MAC, mild               BAE, mild RVE, triv panvalvular regurgitation, mild-mod               AS, G1DD; c.) TTE 09/03/2022: EF >55%, mild MAC, triv               MR/TR/PR, mild AR, mild-mod AS, G1DD No date: Dizziness No date: DOE (dyspnea on exertion) No date: Frequent PVCs No date: GERD (gastroesophageal reflux disease) No date: Hemorrhoids No date: History of tobacco abuse No date: Hyperlipidemia No date: Hypertension No date: Long term current use  of aspirin No date: Microscopic hematuria 11/2021: NSVT (nonsustained ventricular tachycardia) (HCC)     Comment:  a.) noted on holter monitor in 11/2021 No date: OSA on CPAP 02/25/2014: Proteinuria No date: Psoriasis No date: PVD (peripheral vascular disease) (HCC) No date: Stable angina No date: Symptomatic bradycardia No date: T2DM (type 2 diabetes mellitus) (HCC)  Past Surgical History: 10/26/2021: APPLICATION OF INTRAOPERATIVE CT SCAN; N/A     Comment:  Procedure: APPLICATION OF INTRAOPERATIVE CT SCAN;                Surgeon: Venetia Night, MD;  Location: [facility] ORS;                Service: Neurosurgery;  Laterality: N/A; 09/23/2014: COLONOSCOPY WITH PROPOFOL; N/A     Comment:  Procedure: COLONOSCOPY WITH PROPOFOL;  Surgeon: Wallace Cullens, MD;  Location: [facility] ENDOSCOPY;  Service:               Gastroenterology;  Laterality: N/A; 03/27/2018: COLONOSCOPY WITH PROPOFOL; N/A     Comment:  Procedure: COLONOSCOPY WITH PROPOFOL;  Surgeon: Scot Jun, MD;  Location: [facility] ENDOSCOPY;  Service:               Endoscopy;  Laterality: N/A; 08/02/2022: COLONOSCOPY WITH PROPOFOL; N/A     Comment:  Procedure: COLONOSCOPY WITH PROPOFOL;  Surgeon:               Regis Bill, MD;  Location: [facility] ENDOSCOPY;                Service: Endoscopy;  Laterality: N/A; No date: TOOTH EXTRACTION 10/26/2021: TRANSFORAMINAL LUMBAR INTERBODY FUSION (TLIF) WITH  PEDICLE SCREW FIXATION 2 LEVEL; N/A     Comment:  Procedure: OPEN L4-S1 TRANSFORAMINAL LUMBAR INTERBODY               FUSION (TLIF);  Surgeon: Venetia Night, MD;                Location: [facility] ORS;  Service: Neurosurgery;  Laterality:               N/A;     Reproductive/Obstetrics negative OB ROS                             Anesthesia Physical Anesthesia Plan  ASA: 3  Anesthesia Plan: Spinal   Post-op Pain Management: Toradol IV (intra-op)* and Ofirmev IV (intra-op)*   Induction: Intravenous  PONV Risk Score and Plan: 1 and Propofol infusion and TIVA  Airway Management Planned: Natural Airway and Nasal Cannula  Additional Equipment:   Intra-op Plan:   Post-operative Plan:   Informed Consent: I have reviewed the patients History and Physical, chart, labs and discussed the procedure including the risks, benefits and alternatives for the proposed anesthesia with the patient or authorized representative who has indicated his/her understanding and acceptance.     Dental Advisory Given  Plan Discussed with:  Anesthesiologist, CRNA and Surgeon  Anesthesia Plan Comments: (Patient reports no bleeding problems and no anticoagulant use.  Plan for spinal with backup GA  Patient consented for risks of anesthesia including but not limited to:  - adverse reactions to medications - damage to eyes, teeth, lips or other oral mucosa - nerve damage due to positioning  - risk of  bleeding, infection and or nerve damage from spinal that could lead to paralysis - risk of headache or failed spinal - damage to teeth, lips or other oral mucosa - sore throat or hoarseness - damage to heart, brain, nerves, lungs, other parts of body or loss of life  Patient voiced understanding.)        Anesthesia Quick Evaluation

## 2022-10-25 NOTE — Evaluation (Signed)
Physical Therapy Evaluation Patient Details Name: Dean Simpson MRN: [id number] DOB: [birth year] Today's Date: 10/25/2022  History of Present Illness  Pt is a 72 y.o male s/p L THA with anterior approach. PMH of BPH, COPD, smoking, HLD, HTN, psoraisis, CPAP, T2DM, and colon polyps.  Clinical Impression  Pt is a pleasant 72 year old male who is s/p L THA with anterior approach. Pt performs bed mobility min assist, transfers min assist, and ambulation min guard w/ RW. Pt required HHA for supine<>sit EOB. VC needed for hand placement during sit<>stand transfer with RW, pt pulls on walker to stand. Ambulated 60 ft with RW min guard, VC for walker distance. Pt returned to chair with call bell and wife in room. Pt demonstrates deficits with balance, ROM, mobility, strength, safe DME use, endurance, knowledge of precautions, and pain. Pt will continue to receive skilled PT services while admitted and will defer to TOC/care team for updates regarding disposition planning.         Assistance Recommended at Discharge Intermittent Supervision/Assistance  If plan is discharge home, recommend the following:  Can travel by private vehicle  A little help with walking and/or transfers;A little help with bathing/dressing/bathroom;Assistance with cooking/housework;Assist for transportation;Help with stairs or ramp for entrance        Equipment Recommendations None recommended by PT  Recommendations for Other Services       Functional Status Assessment Patient has had a recent decline in their functional status and demonstrates the ability to make significant improvements in function in a reasonable and predictable amount of time.     Precautions / Restrictions Precautions Precautions: Anterior Hip Precaution Booklet Issued: No Restrictions Weight Bearing Restrictions: Yes LLE Weight Bearing: Weight bearing as tolerated      Mobility  Bed Mobility Overal bed mobility: Needs  Assistance Bed Mobility: Supine to Sit     Supine to sit: Min assist     General bed mobility comments: HHA pulling to sit EOB.    Transfers Overall transfer level: Needs assistance Equipment used: Rolling walker (2 wheels) Transfers: Sit to/from Stand Sit to Stand: Min assist           General transfer comment: min guard for physical assist. VC needed for hand placement. Requires education on safe DME use.    Ambulation/Gait Ambulation/Gait assistance: Min guard Gait Distance (Feet): 60 Feet Assistive device: Rolling walker (2 wheels) Gait Pattern/deviations: Decreased step length - right, Decreased stance time - left, Decreased weight shift to left, Narrow base of support       General Gait Details: decreased stance time on LLE, decreased step length RLE and decreased weight shift to LLE. VC needed for appropriate walker distance.  Stairs            Wheelchair Mobility     Tilt Bed    Modified Rankin (Stroke Patients Only)       Balance Overall balance assessment: Needs assistance Sitting-balance support: No upper extremity supported, Feet supported Sitting balance-Leahy Scale: Good     Standing balance support: Bilateral upper extremity supported, During functional activity, Reliant on assistive device for balance Standing balance-Leahy Scale: Fair                               Pertinent Vitals/Pain Pain Assessment Pain Assessment: 0-10 Pain Score: 5  Pain Location: L hip Pain Descriptors / Indicators: Discomfort, Dull Pain Intervention(s): Limited activity within patient's tolerance, Monitored during session,  Premedicated before session, Repositioned    Home Living Family/patient expects to be discharged to:: Private residence Living Arrangements: Spouse/significant other Available Help at Discharge: Family Type of Home: House Home Access: Stairs to enter Entrance Stairs-Rails: None Entrance Stairs-Number of Steps: 3   Home  Layout: One level Home Equipment: Agricultural consultant (2 wheels);Cane - quad;BSC/3in1;Shower seat      Prior Function Prior Level of Function : Independent/Modified Independent             Mobility Comments: Used spc due to pain prior to surgery. ADLs Comments: Patients wife handles the cooking, cleaning and grocery shopping. Patient performs IADL;s and driving IND.     Hand Dominance        Extremity/Trunk Assessment   Upper Extremity Assessment Upper Extremity Assessment: Overall WFL for tasks assessed    Lower Extremity Assessment Lower Extremity Assessment: Overall WFL for tasks assessed       Communication   Communication: Prefers language other than English (Speaks some englush, Wife acts as Nurse, learning disability.)  Cognition Arousal/Alertness: Awake/alert Behavior During Therapy: WFL for tasks assessed/performed Overall Cognitive Status: Within Functional Limits for tasks assessed                                          General Comments      Exercises     Assessment/Plan    PT Assessment Patient needs continued PT services  PT Problem List Decreased strength;Decreased range of motion;Decreased activity tolerance;Decreased balance;Decreased mobility;Decreased knowledge of use of DME;Decreased safety awareness;Decreased knowledge of precautions;Pain       PT Treatment Interventions DME instruction;Gait training;Stair training;Functional mobility training;Therapeutic activities;Balance training;Therapeutic exercise;Patient/family education    PT Goals (Current goals can be found in the Care Plan section)  Acute Rehab PT Goals Patient Stated Goal: To go home PT Goal Formulation: With patient/family Time For Goal Achievement: 11/08/22 Potential to Achieve Goals: Good    Frequency BID     Co-evaluation               AM-PAC PT "6 Clicks" Mobility  Outcome Measure Help needed turning from your back to your side while in a flat bed without  using bedrails?: A Little Help needed moving from lying on your back to sitting on the side of a flat bed without using bedrails?: A Little Help needed moving to and from a bed to a chair (including a wheelchair)?: A Little Help needed standing up from a chair using your arms (e.g., wheelchair or bedside chair)?: A Little Help needed to walk in hospital room?: A Little Help needed climbing 3-5 steps with a railing? : A Lot 6 Click Score: 17    End of Session Equipment Utilized During Treatment: Gait belt Activity Tolerance: Patient tolerated treatment well Patient left: in chair;with call bell/phone within reach;with chair alarm set Nurse Communication: Mobility status PT Visit Diagnosis: Unsteadiness on feet (R26.81);Other abnormalities of gait and mobility (R26.89);Muscle weakness (generalized) (M62.81);Pain Pain - Right/Left: Left Pain - part of body: Hip    Time: 1610-9604 PT Time Calculation (min) (ACUTE ONLY): 17 min   Charges:                 Malachi Carl, SPT   Malachi Carl 10/25/2022, 4:45 PM

## 2022-10-25 NOTE — Op Note (Signed)
Patient Name: Taydon Huwe  WUJ:[id number]  Pre-Operative Diagnosis: Left hip Osteoarthritis  Post-Operative Diagnosis: (same)  Procedure: Left Total Hip Arthroplasty  Components/Implants: Cup: Trident Tritanium Clusterhole 54/E w/ x2 screws    Liner: Trident X3 neutral 36/E  Stem: Insignia 3 Std offset  Head:Biolox 36mm +0   Date of Surgery: 10/25/2022  Surgeon: Reinaldo Berber MD  Assistant: Amador Cunas PA (present and scrubbed throughout the case, critical for assistance with exposure, retraction, instrumentation, and closure)   Anesthesiologist: Joelene Millin  Anesthesia: Spinal   EBL: 150cc  IVF:1300cc  Complications: None   Brief history: The patient is a 72 year old male with a history of osteoarthritis of the left hip with pain limiting their range of motion and activities of daily living, which has failed multiple attempts at conservative therapy.  The risks and benefits of total hip arthroplasty as definitive surgical treatment were discussed with the patient, who opted to proceed with the operation.  After outpatient medical clearance and optimization was completed the patient was admitted to [facility] for the procedure.  All preoperative films were reviewed and an appropriate surgical plan was made prior to surgery.   Description of procedure: The patient was brought to the operating room where laterality was confirmed by all those present to be the left side.  The patient was administered spinal anesthesia on a stretcher prior to being moved supine on the operating room table. Patient was given an intravenous dose of antibiotics for surgical prophylaxis and TXA.  All bony prominences and extremities were well padded and the patient was securely attached to the table boots, a perineal post was placed and the patient had a safety strap placed.  Surgical site was prepped with alcohol and chlorhexidine. The surgical site over the hip was and draped in  typical sterile fashion with multiple layers of adhesive and nonadhesive drapes.  The incision site was marked out with a sterile marker and care was taken to assess the position of the ASIS and ensure appropriate position for the incision.    A surgical timeout was then called with participation of all staff in the room the patient was then a confirmed again and laterality confirmed.  Incision was made over the anterior lateral aspect of the proximal thigh in line with the TFL.  Appropriate retractors were placed and all bleeding vessels were coagulated within the subcutaneous and fatty layers.  An incision was made in the TFL fascia in the interval was carefully identified.  The lateral ascending branches of the circumflex vessels were identified, cauterized and carefully dissected. The main vessels were then tied with a 0 silk hand tie.  Retractors were placed around the superior lateral and inferior medial aspects of the femoral neck and a capsulotomy was performed exposing the hip joint.  Retraction stitches were placed and the capsulotomy to assist with visualization.  Femoral neck cut was then made and the femoral head was extracted after placing the leg in traction.  Bone wax was then applied to the proximal cut surface of the femur and aqua mantis was used to address any bleeding around the femoral neck cut.  Retractors were then placed around the acetabulum to fully visualize the joint space, and the remaining labral tissue was removed and pulvinar was removed.   The acetabulum was then sequentially reamed up to the appropriate size in order to get good fit and fill for the acetabular component while under fluoroscopic guidance.  Acetabular component was then placed and malleted into  a secure fit while confirming position and abduction angle and anteversion utilizing fluoroscopy.  2 screws were then placed in the acetabular cup to assist in securing the cup in place. The cup was irrigated and a real  neutral liner was placed, impacted, and checked for stability.  The femur traction was dropped and sequentially externally rotated while performing a release of the posterior and superomedial tissues off of the proximal femur to allow for mobility, care was taken to preserve the external rotators and piriformis attachments.  The remaining interval between the abductors and the capsule was dissected out and a retractor was placed over the superolateral aspect of the femur over the greater trochanter.  The leg was carefully brought down into extension and adducted to provide visualization of the proximal femur for broaching.  The femur was then sequentially broached up to an appropriate size which provided for good fill and stability to the femoral broach.  A trial neck and head were placed on the femoral broach and the leg was brought up for reduction.  The hip was reduced and manual check of stability was performed.  The hip was found to be stable in flexion internal rotation and extension external rotation.  Leg lengths were confirmed on fluoroscopy.   The hip was then dislocated the trial neck and head were removed. The leg was then brought down into extension and adduction in the proximal femur was reexposed.  The broach trial was removed and the femur was irrigated with normal saline prior to the real femoral stem being implanted.  After the femoral stem was seated and shown to have good fit and fill the appropriate head was impacted the leg was brought up and reduced.  There was good range of motion with stability in flexion internal rotation and extension external rotation on testing.  Leg lengths were found to be appropriate on fluoroscopic evaluation at this time.  The hip was then irrigated with betdine based surgiphor solution and then saline solution.  The capsulotomy was repaired with Ethibond sutures.  A pericapsular and peritrochanteric cocktail with Exparel and bupivacaine was then injected as well  as the subcutaneous tissues. The fascia was closed with a #2 barbed running suture.  The deep tissues were closed with Vicryl sutures the subcutaneous tissues were closed with interrupted Vicryl sutures and a running barbed 3-0 suture.  The skin was then reinforced with Dermabond and a sterile dressing was placed.   The patient was awoken from anesthesia transferred off of the operating room table onto a hospital bed where examination of leg lengths found the leg lengths to be equal with a good distal pulse.  The patient was then transferred to the PACU in stable condition.

## 2022-10-25 NOTE — Transfer of Care (Signed)
Immediate Anesthesia Transfer of Care Note  Patient: Dean Simpson  Procedure(s) Performed: TOTAL HIP ARTHROPLASTY ANTERIOR APPROACH (Left: Hip)  Patient Location: PACU  Anesthesia Type:General  Level of Consciousness: awake and alert   Airway & Oxygen Therapy: Patient Spontanous Breathing and Patient connected to nasal cannula oxygen  Post-op Assessment: Report given to RN and Post -op Vital signs reviewed and stable  Post vital signs: Reviewed and stable  Last Vitals:  Vitals Value Taken Time  BP 124/54 10/25/22 1219  Temp    Pulse 69 10/25/22 1221  Resp 27 10/25/22 1221  SpO2 92 % 10/25/22 1221  Vitals shown include unvalidated device data.  Last Pain:  Vitals:   10/25/22 0947  TempSrc: Oral  PainSc: 0-No pain         Complications: No notable events documented.

## 2022-10-25 NOTE — Anesthesia Procedure Notes (Signed)
Spinal  Patient location during procedure: OR Start time: 10/25/2022 10:16 AM End time: 10/25/2022 10:21 AM Reason for block: surgical anesthesia Staffing Anesthesiologist: Louie Boston, MD Resident/CRNA: Berniece Pap, CRNA Performed by: Berniece Pap, CRNA Authorized by: Louie Boston, MD   Preanesthetic Checklist Completed: patient identified, IV checked, site marked, risks and benefits discussed, surgical consent, monitors and equipment checked, pre-op evaluation and timeout performed Spinal Block Patient position: sitting Prep: DuraPrep Patient monitoring: heart rate, cardiac monitor, continuous pulse ox and blood pressure Approach: midline Location: L3-4 Injection technique: single-shot Needle Needle type: Sprotte  Needle gauge: 24 G Needle length: 9 cm Assessment Sensory level: T4 Events: CSF return

## 2022-10-26 ENCOUNTER — Encounter: Payer: Self-pay | Admitting: Orthopedic Surgery

## 2022-10-26 DIAGNOSIS — M1612 Unilateral primary osteoarthritis, left hip: Secondary | ICD-10-CM | POA: Diagnosis not present

## 2022-10-26 LAB — CBC
HCT: 37.4 % — ABNORMAL LOW (ref 39.0–52.0)
Hemoglobin: 12.1 g/dL — ABNORMAL LOW (ref 13.0–17.0)
MCH: 27.3 pg (ref 26.0–34.0)
MCHC: 32.4 g/dL (ref 30.0–36.0)
MCV: 84.4 fL (ref 80.0–100.0)
Platelets: 231 10*3/uL (ref 150–400)
RBC: 4.43 MIL/uL (ref 4.22–5.81)
RDW: 12.8 % (ref 11.5–15.5)
WBC: 12.3 10*3/uL — ABNORMAL HIGH (ref 4.0–10.5)
nRBC: 0 % (ref 0.0–0.2)

## 2022-10-26 LAB — BASIC METABOLIC PANEL
Anion gap: 9 (ref 5–15)
BUN: 20 mg/dL (ref 8–23)
CO2: 22 mmol/L (ref 22–32)
Calcium: 8.4 mg/dL — ABNORMAL LOW (ref 8.9–10.3)
Chloride: 104 mmol/L (ref 98–111)
Creatinine, Ser: 0.83 mg/dL (ref 0.61–1.24)
GFR, Estimated: >60 mL/min (ref >60)
Glucose, Bld: 203 mg/dL — ABNORMAL HIGH (ref 70–99)
Potassium: 3.4 mmol/L — ABNORMAL LOW (ref 3.5–5.1)
Sodium: 135 mmol/L (ref 135–145)

## 2022-10-26 LAB — GLUCOSE, CAPILLARY: Glucose-Capillary: 176 mg/dL — ABNORMAL HIGH (ref 70–99)

## 2022-10-26 MED ORDER — IPRATROPIUM-ALBUTEROL 0.5-2.5 (3) MG/3ML IN SOLN
RESPIRATORY_TRACT | Status: AC
  Filled 2022-10-26: qty 3

## 2022-10-26 MED ORDER — ROFLUMILAST 500 MCG PO TABS
500.0000 ug | ORAL_TABLET | Freq: Every day | ORAL | Status: DC
  Administered 2022-10-26: 500 ug via ORAL
  Filled 2022-10-26: qty 1

## 2022-10-26 MED ORDER — ACETAMINOPHEN 500 MG PO TABS
ORAL_TABLET | ORAL | Status: AC
  Filled 2022-10-26: qty 2

## 2022-10-26 MED ORDER — OXYCODONE HCL 5 MG PO TABS: 2.5000 mg | ORAL_TABLET | Freq: Three times a day (TID) | ORAL | 0 refills | Status: DC | PRN

## 2022-10-26 MED ORDER — ACETAMINOPHEN 500 MG PO TABS: 1000.0000 mg | ORAL_TABLET | Freq: Three times a day (TID) | ORAL | 0 refills | Status: AC

## 2022-10-26 MED ORDER — TRAMADOL HCL 50 MG PO TABS: 50.0000 mg | ORAL_TABLET | Freq: Four times a day (QID) | ORAL | 0 refills | Status: DC | PRN

## 2022-10-26 MED ORDER — ENOXAPARIN SODIUM 40 MG/0.4ML IJ SOSY: 40.0000 mg | PREFILLED_SYRINGE | INTRAMUSCULAR | 0 refills | Status: AC

## 2022-10-26 MED ORDER — HYDROCHLOROTHIAZIDE 25 MG PO TABS
ORAL_TABLET | ORAL | Status: AC
  Filled 2022-10-26: qty 1

## 2022-10-26 MED ORDER — KETOROLAC TROMETHAMINE 15 MG/ML IJ SOLN
INTRAMUSCULAR | Status: AC
  Filled 2022-10-26: qty 1

## 2022-10-26 MED ORDER — ENOXAPARIN SODIUM 40 MG/0.4ML IJ SOSY
PREFILLED_SYRINGE | INTRAMUSCULAR | Status: AC
  Filled 2022-10-26: qty 0.4

## 2022-10-26 MED ORDER — DOCUSATE SODIUM 100 MG PO CAPS
ORAL_CAPSULE | ORAL | Status: AC
  Filled 2022-10-26: qty 1

## 2022-10-26 MED ORDER — ONDANSETRON HCL 4 MG PO TABS: 4.0000 mg | ORAL_TABLET | Freq: Four times a day (QID) | ORAL | 0 refills | Status: DC | PRN

## 2022-10-26 MED ORDER — PANTOPRAZOLE SODIUM 40 MG PO TBEC
DELAYED_RELEASE_TABLET | ORAL | Status: AC
  Filled 2022-10-26: qty 1

## 2022-10-26 MED ORDER — CELECOXIB 200 MG PO CAPS: 200.0000 mg | ORAL_CAPSULE | Freq: Two times a day (BID) | ORAL | 0 refills | Status: AC

## 2022-10-26 MED ORDER — INSULIN ASPART 100 UNIT/ML IJ SOLN
INTRAMUSCULAR | Status: AC
  Filled 2022-10-26: qty 1

## 2022-10-26 MED ORDER — ATORVASTATIN CALCIUM 20 MG PO TABS
ORAL_TABLET | ORAL | Status: AC
  Filled 2022-10-26: qty 2

## 2022-10-26 MED ORDER — DOCUSATE SODIUM 100 MG PO CAPS: 100.0000 mg | ORAL_CAPSULE | Freq: Two times a day (BID) | ORAL | 0 refills | Status: DC

## 2022-10-26 NOTE — Discharge Summary (Signed)
Physician Discharge Summary  Patient ID: Dean Simpson MRN: [id number] DOB/AGE: 70-Jan-1952 70 y.o.  Admit date: 10/25/2022 Discharge date: 10/26/2022  Admission Diagnoses:  Osteoarthritis of left hip [M16.12]   Discharge Diagnoses: Patient Active Problem List   Diagnosis Date Noted   Osteoarthritis of left hip 10/25/2022   Dizziness 11/10/2021   Hypertensive urgency 11/10/2021   COPD (chronic obstructive pulmonary disease) (HCC)    BPH (benign prostatic hyperplasia)    Diabetes mellitus without complication (HCC)    OSA on CPAP with O2 bleed in    Obesity (BMI 30-39.9)    Frequent PVCs    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 10/26/2021   Type 2 diabetes mellitus with microalbuminuria, without long-term current use of insulin (HCC) 09/17/2013    Past Medical History:  Diagnosis Date   Adenomatous colon polyp    Amaurosis fugax    Anemia    Aortic atherosclerosis (HCC)    Aortic stenosis    a.) TTE 11/11/2021: mild (MPG 11; AVA 1.65); b.) TTE 01/11/2022: mild-mod (MPG 16; AVA 1.3); c.) TTE 05/147/2024: mod (MPG 16.3; AVA 0.95)   Arthritis    BPH (benign prostatic hyperplasia)    Cervical disc disease    COPD (chronic obstructive pulmonary disease) (HCC)    Coronary artery disease    DDD (degenerative disc disease), lumbar    a.) s/p open L4-S1 TLIF 10/26/2021   Diastolic dysfunction 11/11/2021   a.) TTE 11/11/2021: EF 65-70%, no RWMAs, mild AS, G1DD; b.) TTE 01/11/2022: EF >55%, mild LVH, mild MAC, mild BAE, mild RVE, triv panvalvular regurgitation, mild-mod AS, G1DD; c.) TTE 09/03/2022: EF >55%, mild MAC, triv MR/TR/PR, mild AR, mild-mod AS, G1DD   Dizziness    DOE (dyspnea on exertion)    Frequent PVCs    GERD (gastroesophageal reflux disease)    Hemorrhoids    History of tobacco abuse    Hyperlipidemia    Hypertension    Long term current use of aspirin    Microscopic hematuria    NSVT (nonsustained ventricular tachycardia) (HCC) 11/2021   a.) noted on holter  monitor in 11/2021   OSA on CPAP    Proteinuria 02/25/2014   Psoriasis    PVD (peripheral vascular disease) (HCC)    Stable angina    Symptomatic bradycardia    T2DM (type 2 diabetes mellitus) (HCC)      Transfusion: None   Consultants (if any):   Discharged Condition: Improved  Hospital Course: Dean Simpson is an 72 y.o. male who was admitted 10/25/2022 with a diagnosis of Osteoarthritis of left hip and went to the operating room on 10/25/2022 and underwent the above named procedures.    Surgeries: Procedure(s): TOTAL HIP ARTHROPLASTY ANTERIOR APPROACH on 10/25/2022 Patient tolerated the surgery well. Taken to PACU where she was stabilized and then transferred to the orthopedic floor.  Started on Lovenox 40 mg q 24 hrs. TEDs and SCDs applied bilaterally. Heels elevated on bed. No evidence of DVT. Negative Homan. Physical therapy started on day #1 for gait training and transfer. OT started day #1 for ADL and assisted devices.  Patient's IV was d/c on day 1. Patient was able to safely and independently complete all PT goals. PT recommending discharge to home.    On post op day # 1 patient was stable and ready for discharge to home with home health PT.  Implants: Cup: Trident Tritanium Clusterhole 54/E w/ x2 screws    Liner: Trident X3 neutral 36/E  Stem: Insignia 3 Std offset  Head:Biolox 36mm +0   He was given perioperative antibiotics:  Anti-infectives (From admission, onward)    Start     Dose/Rate Route Frequency Ordered Stop   10/25/22 1600  ceFAZolin (ANCEF) IVPB 2g/100 mL premix        2 g 200 mL/hr over 30 Minutes Intravenous Every 6 hours 10/25/22 1328 10/25/22 2153   10/25/22 0600  ceFAZolin (ANCEF) IVPB 2g/100 mL premix        2 g 200 mL/hr over 30 Minutes Intravenous On call to O.R. 10/24/22 2205 10/25/22 1031     .  He was given sequential compression devices, early ambulation, and Lovenox, teds for DVT prophylaxis.  He benefited maximally from  the hospital stay and there were no complications.    Recent vital signs:  Vitals:   10/26/22 0531 10/26/22 0746  BP: (!) 153/68 (!) 155/75  Pulse: 84 87  Resp: 16 16  Temp: 98.6 F (37 C) 98.1 F (36.7 C)  SpO2: 94% 95%    Recent laboratory studies:  Lab Results  Component Value Date   HGB 12.1 (L) 10/26/2022   HGB 15.0 10/25/2022   HGB 13.7 10/13/2022   Lab Results  Component Value Date   WBC 12.3 (H) 10/26/2022   PLT 231 10/26/2022   No results found for: "INR" Lab Results  Component Value Date   NA 135 10/26/2022   K 3.4 (L) 10/26/2022   CL 104 10/26/2022   CO2 22 10/26/2022   BUN 20 10/26/2022   CREATININE 0.83 10/26/2022   GLUCOSE 203 (H) 10/26/2022    Discharge Medications:   Allergies as of 10/26/2022       Reactions   Iodine Swelling   shellfish shellfish   Shellfish Allergy Itching, Swelling   NOT AT EVERY EXPOSURE        Medication List     TAKE these medications    acetaminophen 500 MG tablet Commonly known as: TYLENOL Take 2 tablets (1,000 mg total) by mouth every 8 (eight) hours.   ALBUTEROL-IPRATROPIUM IN Inhale 3 mLs into the lungs 2 (two) times daily.   Combivent Respimat 20-100 MCG/ACT Aers respimat Generic drug: Ipratropium-Albuterol Inhale 1 puff into the lungs every 6 (six) hours.   amLODipine 10 MG tablet Commonly known as: NORVASC Take 10 mg by mouth at bedtime.   aspirin EC 81 MG tablet Take 81 mg by mouth at bedtime.   atorvastatin 80 MG tablet Commonly known as: LIPITOR Take 40 mg by mouth at bedtime.   canagliflozin 300 MG Tabs tablet Commonly known as: INVOKANA Take 300 mg by mouth daily before breakfast.   celecoxib 200 MG capsule Commonly known as: CeleBREX Take 1 capsule (200 mg total) by mouth 2 (two) times daily for 14 days.   Daliresp 250 MCG Tabs Generic drug: Roflumilast Take 1 tablet by mouth daily at 6 (six) AM.   docusate sodium 100 MG capsule Commonly known as: COLACE Take 1 capsule (100  mg total) by mouth 2 (two) times daily.   doxazosin 4 MG tablet Commonly known as: CARDURA Take 1 tablet (4 mg total) by mouth daily. What changed: when to take this   enoxaparin 40 MG/0.4ML injection Commonly known as: LOVENOX Inject 0.4 mLs (40 mg total) into the skin daily for 14 days.   gabapentin 300 MG capsule Commonly known as: NEURONTIN Take 300 mg by mouth at bedtime.   glimepiride 4 MG tablet Commonly known as: AMARYL Take 4 mg by mouth daily with breakfast.  hydrOXYzine 25 MG tablet Commonly known as: ATARAX Take 25 mg by mouth 3 (three) times daily as needed for itching.   meclizine 12.5 MG tablet Commonly known as: ANTIVERT Take 12.5 mg by mouth 3 (three) times daily as needed for dizziness.   metFORMIN 1000 MG tablet Commonly known as: GLUCOPHAGE Take 1,000 mg by mouth 2 (two) times daily with a meal.   metoprolol succinate 100 MG 24 hr tablet Commonly known as: TOPROL-XL Take 100 mg by mouth every morning. Take with or immediately following a meal.   nystatin ointment Commonly known as: MYCOSTATIN Apply 1 application topically as needed.   ondansetron 4 MG tablet Commonly known as: ZOFRAN Take 1 tablet (4 mg total) by mouth every 6 (six) hours as needed for nausea.   oxyCODONE 5 MG immediate release tablet Commonly known as: Roxicodone Take 0.5 tablets (2.5 mg total) by mouth every 8 (eight) hours as needed for breakthrough pain.   pantoprazole 40 MG tablet Commonly known as: PROTONIX Take 40 mg by mouth daily.   potassium chloride 10 MEQ tablet Commonly known as: KLOR-CON Take 1 tablet (10 mEq) daily starting the week before surgery (first dose 10/18/2022). Follow up with PCP for repeat labs.   Semaglutide (2 MG/DOSE) 8 MG/3ML Sopn Inject 1 Dose into the skin once a week. On Sundays   traMADol 50 MG tablet Commonly known as: ULTRAM Take 1 tablet (50 mg total) by mouth every 6 (six) hours as needed for moderate pain.   triamcinolone cream  0.5 % Commonly known as: KENALOG Apply 1 Application topically as needed.   valsartan-hydrochlorothiazide 320-25 MG tablet Commonly known as: DIOVAN-HCT Take 1 tablet by mouth every morning.        Diagnostic Studies: DG HIP UNILAT WITH PELVIS 1V LEFT  Result Date: 10/25/2022 CLINICAL DATA:  Elect diff surgery. EXAM: DG HIP (WITH OR WITHOUT PELVIS) 1V*L* COMPARISON:  None Available. FINDINGS: Three fluoroscopic spot views of the pelvis and left hip obtained in the operating room. Images obtained after hip arthroplasty. Fluoroscopy time 19 seconds. Dose 4.8777 mGy. IMPRESSION: Intraoperative fluoroscopy for left hip arthroplasty. Electronically Signed   By: Narda Rutherford M.D.   On: 10/25/2022 15:09   DG C-Arm 1-60 Min-No Report  Result Date: 10/25/2022 Fluoroscopy was utilized by the requesting physician.  No radiographic interpretation.   DG C-Arm 1-60 Min-No Report  Result Date: 10/25/2022 Fluoroscopy was utilized by the requesting physician.  No radiographic interpretation.    Disposition:      Follow-up Information     Evon Slack, PA-C Follow up in 2 week(s).   Specialties: Orthopedic Surgery, Emergency Medicine Contact information: [address] [place] [address] [phone]                  Signed: Patience Musca 10/26/2022, 7:58 AM

## 2022-10-26 NOTE — Progress Notes (Addendum)
Subjective: 1 Day Post-Op Procedure(s) (LRB): TOTAL HIP ARTHROPLASTY ANTERIOR APPROACH (Left) Patient reports pain as mild.   Patient is well, and has had no acute complaints or problems Denies any CP, SOB, ABD pain. We will continue therapy today.  Plan is to go Home after hospital stay.  Objective: Vital signs in last 24 hours: Temp:  [97.2 F (36.2 C)-98.6 F (37 C)] 98.1 F (36.7 C) (07/09 0746) Pulse Rate:  [68-90] 87 (07/09 0746) Resp:  [14-28] 16 (07/09 0746) BP: (115-155)/(52-76) 155/75 (07/09 0746) SpO2:  [91 %-96 %] 95 % (07/09 0746) Weight:  [100.7 kg] 100.7 kg (07/08 0947)  Intake/Output from previous day: 07/08 0701 - 07/09 0700 In: 1469.2 [P.O.:240; I.V.:1029.2; IV Piggyback:200] Out: 1000 [Urine:850; Blood:150] Intake/Output this shift: No intake/output data recorded.  Recent Labs    10/25/22 0954 10/26/22 0618  HGB 15.0 12.1*   Recent Labs    10/25/22 0954 10/26/22 0618  WBC  --  12.3*  RBC  --  4.43  HCT 44.0 37.4*  PLT  --  231   Recent Labs    10/25/22 0954 10/26/22 0618  NA 138 135  K 3.9 3.4*  CL 100 104  CO2  --  22  BUN 19 20  CREATININE 0.80 0.83  GLUCOSE 158* 203*  CALCIUM  --  8.4*   No results for input(s): "LABPT", "INR" in the last 72 hours.  EXAM General - Patient is Alert, Appropriate, and Oriented Extremity - Neurovascular intact Sensation intact distally Intact pulses distally Dorsiflexion/Plantar flexion intact No cellulitis present Compartment soft Dressing - dressing C/D/I and no drainage Motor Function - intact, moving foot and toes well on exam.   Past Medical History:  Diagnosis Date   Adenomatous colon polyp    Amaurosis fugax    Anemia    Aortic atherosclerosis (HCC)    Aortic stenosis    a.) TTE 11/11/2021: mild (MPG 11; AVA 1.65); b.) TTE 01/11/2022: mild-mod (MPG 16; AVA 1.3); c.) TTE 05/147/2024: mod (MPG 16.3; AVA 0.95)   Arthritis    BPH (benign prostatic hyperplasia)    Cervical disc  disease    COPD (chronic obstructive pulmonary disease) (HCC)    Coronary artery disease    DDD (degenerative disc disease), lumbar    a.) s/p open L4-S1 TLIF 10/26/2021   Diastolic dysfunction 11/11/2021   a.) TTE 11/11/2021: EF 65-70%, no RWMAs, mild AS, G1DD; b.) TTE 01/11/2022: EF >55%, mild LVH, mild MAC, mild BAE, mild RVE, triv panvalvular regurgitation, mild-mod AS, G1DD; c.) TTE 09/03/2022: EF >55%, mild MAC, triv MR/TR/PR, mild AR, mild-mod AS, G1DD   Dizziness    DOE (dyspnea on exertion)    Frequent PVCs    GERD (gastroesophageal reflux disease)    Hemorrhoids    History of tobacco abuse    Hyperlipidemia    Hypertension    Long term current use of aspirin    Microscopic hematuria    NSVT (nonsustained ventricular tachycardia) (HCC) 11/2021   a.) noted on holter monitor in 11/2021   OSA on CPAP    Proteinuria 02/25/2014   Psoriasis    PVD (peripheral vascular disease) (HCC)    Stable angina    Symptomatic bradycardia    T2DM (type 2 diabetes mellitus) (HCC)     Assessment/Plan:   1 Day Post-Op Procedure(s) (LRB): TOTAL HIP ARTHROPLASTY ANTERIOR APPROACH (Left) Principal Problem:   Osteoarthritis of left hip  Estimated body mass index is 32.78 kg/m as calculated from the following:  Height as of this encounter: 5\' 9"  (1.753 m).   Weight as of this encounter: 100.7 kg. Advance diet Up with therapy Patient doing well this morning.  Ambulatory in the room with no complications. Pain well-controlled Labs and vital signs are stable Care management to assist with discharge to home with home health PT today  DVT Prophylaxis - Lovenox, TED hose, and SCDs Weight-Bearing as tolerated to left leg   T. Cranston Neighbor, PA-C [facility] 10/26/2022, 7:53 AM   Patient seen and examined, agree with above plan.  The patient is doing well status post left anterior total hip arthroplasty, no concerns at this time.  Pain is controlled.  Discussed DVT  prophylaxis, pain medication use, and safe transition to home.  All questions answered the patient agrees with above plan will go home after clears PT.   Reinaldo Berber MD

## 2022-10-26 NOTE — Discharge Instructions (Signed)
Instructions after Anterior Total Hip Replacement        Dr. Zachary Aberman, Jr., M.D.      Dept. of Orthopaedics & Sports Medicine  [facility]  [address]  [place], [place]  [address]  Phone: [phone]   Fax: [phone]    DIET: Drink plenty of non-alcoholic fluids. Resume your normal diet. Include foods high in fiber.  ACTIVITY:  You may use crutches or a walker with weight-bearing as tolerated, unless instructed otherwise. You may be weaned off of the walker or crutches by your Physical Therapist.  Continue doing gentle exercises. Exercising will reduce the pain and swelling, increase motion, and prevent muscle weakness.   Please continue to use the TED compression stockings for 2 weeks. You may remove the stockings at night, but should reapply them in the morning. Do not drive or operate any equipment until instructed.  WOUND CARE:  Continue to use ice packs periodically to reduce pain and swelling. You may shower with honeycomb dressing 3 days after your surgery. Do not submerge incision site under water. Remove honeycomb dressing 7 days after surgery and allow dermabond to fall off on its own.   MEDICATIONS: You may resume your regular medications. Please take the pain medication as prescribed on the medication list. Do not take pain medication on an empty stomach. You have been given a prescription for a blood thinner to prevent blood clots. Please take the medication as instructed. (NOTE: After completing a 2 week course of Lovenox, take one Enteric-coated 81 mg aspirin twice a day for 3 additional weeks.) Pain medications and iron supplements can cause constipation. Use a stool softener (Senokot or Colace) on a daily basis and a laxative (dulcolax or miralax) as needed. Do not drive or drink alcoholic beverages when taking pain medications.  POSTOPERATIVE CONSTIPATION PROTOCOL Constipation - defined medically as fewer than three stools per week and  severe constipation as less than one stool per week.  One of the most common issues patients have following surgery is constipation.  Even if you have a regular bowel pattern at home, your normal regimen is likely to be disrupted due to multiple reasons following surgery.  Combination of anesthesia, postoperative narcotics, change in appetite and fluid intake all can affect your bowels.  In order to avoid complications following surgery, here are some recommendations in order to help you during your recovery period.  Colace (docusate) - Pick up an over-the-counter form of Colace or another stool softener and take twice a day as long as you are requiring postoperative pain medications.  Take with a full glass of water daily.  If you experience loose stools or diarrhea, hold the colace until you stool forms back up.  If your symptoms do not get better within 1 week or if they get worse, check with your doctor.  Dulcolax (bisacodyl) - Pick up over-the-counter and take as directed by the product packaging as needed to assist with the movement of your bowels.  Take with a full glass of water.  Use this product as needed if not relieved by Colace only.   MiraLax (polyethylene glycol) - Pick up over-the-counter to have on hand.  MiraLax is a solution that will increase the amount of water in your bowels to assist with bowel movements.  Take as directed and can mix with a glass of water, juice, soda, coffee, or tea.  Take if you go more than two days without a movement. Do not use MiraLax more than   once per day. Call your doctor if you are still constipated or irregular after using this medication for 7 days in a row.  If you continue to have problems with postoperative constipation, please contact the office for further assistance and recommendations.  If you experience "the worst abdominal pain ever" or develop nausea or vomiting, please contact the office immediatly for further recommendations for  treatment.   CALL THE OFFICE FOR: Temperature above 101 degrees Excessive bleeding or drainage on the dressing. Excessive swelling, coldness, or paleness of the toes. Persistent nausea and vomiting.  FOLLOW-UP:  You should have an appointment to return to the office in 2 weeks after surgery. Arrangements have been made for continuation of Physical Therapy (either home therapy or outpatient therapy).  

## 2022-10-26 NOTE — TOC Progression Note (Signed)
Transition of Care [facility]) - Progression Note    Patient Details  Name: Dean Simpson MRN: [id number] Date of Birth: July 30, 1950  Transition of Care [facility]) CM/SW Contact  Marlowe Sax, RN Phone Number: 10/26/2022, 8:49 AM  Clinical Narrative:    The patient is set up with [facility] for [facility] services prior to Surgery by surgeons office He has Rolling Walker (2 wheels);Cane - quad;BSC/3in1;Shower seat    Expected Discharge Plan: Home w Home Health Services Barriers to Discharge: No Barriers Identified  Expected Discharge Plan and Services   Discharge Planning Services: CM Consult   Living arrangements for the past 2 months: Single Family Home Expected Discharge Date: 10/26/22               DME Arranged: N/A         HH Arranged: PT HH Agency: [facility])         Social Determinants of Health (SDOH) Interventions SDOH Screenings   Food Insecurity: No Food Insecurity (10/25/2022)  Housing: Low Risk  (10/25/2022)  Transportation Needs: No Transportation Needs (10/25/2022)  Utilities: Not At Risk (10/25/2022)  Tobacco Use: Medium Risk (10/25/2022)    Readmission Risk Interventions     No data to display

## 2022-10-26 NOTE — Progress Notes (Signed)
Patient is not able to walk the distance required to go the bathroom, or he is unable to safely negotiate stairs required to access the bathroom.  A 3in1 BSC will alleviate this problem.        T. Chris Salvatrice Morandi, PA-C [facility] 

## 2022-10-26 NOTE — Anesthesia Postprocedure Evaluation (Signed)
Anesthesia Post Note  Patient: Devon Georgios Morken  Procedure(s) Performed: TOTAL HIP ARTHROPLASTY ANTERIOR APPROACH (Left: Hip)  Patient location during evaluation: Nursing Unit Anesthesia Type: Spinal Level of consciousness: awake, awake and alert and oriented Pain management: pain level controlled Vital Signs Assessment: post-procedure vital signs reviewed and stable Respiratory status: spontaneous breathing, nonlabored ventilation and respiratory function stable Cardiovascular status: blood pressure returned to baseline and stable Postop Assessment: no headache and no backache Anesthetic complications: no  No notable events documented.   Last Vitals:  Vitals:   10/26/22 0531 10/26/22 0746  BP: (!) 153/68 (!) 155/75  Pulse: 84 87  Resp: 16 16  Temp: 37 C 36.7 C  SpO2: 94% 95%    Last Pain:  Vitals:   10/26/22 0746  TempSrc: Oral  PainSc:                  Ginger Carne

## 2022-10-26 NOTE — Progress Notes (Signed)
Physical Therapy Treatment Patient Details Name: Dean Simpson MRN: [id number] DOB: 11/13/1950 Today's Date: 10/26/2022   History of Present Illness Pt is a 72 y.o male s/p L THA with anterior approach. PMH of BPH, COPD, smoking, HLD, HTN, psoraisis, CPAP, T2DM, and colon polyps.    PT Comments  Pt ready for session. Supine HEP and education prior to OOB.  He is able to stand and walk to/from stairs on unit for backwards with walker review.  Family in to assist and for training.  Overall does well with mobility with short seated rest before and after steps.  Questions answered and no further acute needs identified.    Assistance Recommended at Discharge Intermittent Supervision/Assistance  If plan is discharge home, recommend the following:  Can travel by private vehicle    A little help with walking and/or transfers;A little help with bathing/dressing/bathroom;Assistance with cooking/housework;Assist for transportation;Help with stairs or ramp for entrance      Equipment Recommendations  None recommended by PT    Recommendations for Other Services       Precautions / Restrictions Precautions Precautions: Anterior Hip Precaution Booklet Issued: No Restrictions Weight Bearing Restrictions: Yes LLE Weight Bearing: Weight bearing as tolerated     Mobility  Bed Mobility Overal bed mobility: Needs Assistance Bed Mobility: Supine to Sit     Supine to sit: Supervision       Patient Response: Cooperative  Transfers Overall transfer level: Needs assistance Equipment used: Rolling walker (2 wheels) Transfers: Sit to/from Stand Sit to Stand: Supervision           General transfer comment: cues for hand placements throughout session    Ambulation/Gait Ambulation/Gait assistance: Min guard, Supervision Gait Distance (Feet): 200 Feet Assistive device: Rolling walker (2 wheels) Gait Pattern/deviations: Decreased step length - right, Decreased stance time  - left, Decreased weight shift to left, Narrow base of support Gait velocity: WFL     General Gait Details: overall does well.   Stairs             Wheelchair Mobility     Tilt Bed Tilt Bed Patient Response: Cooperative  Modified Rankin (Stroke Patients Only)       Balance Overall balance assessment: Needs assistance Sitting-balance support: No upper extremity supported, Feet supported Sitting balance-Leahy Scale: Good     Standing balance support: Bilateral upper extremity supported, During functional activity, Reliant on assistive device for balance Standing balance-Leahy Scale: Fair                              Cognition Arousal/Alertness: Awake/alert Behavior During Therapy: WFL for tasks assessed/performed Overall Cognitive Status: Within Functional Limits for tasks assessed                                          Exercises      General Comments        Pertinent Vitals/Pain Pain Assessment Pain Assessment: Faces Faces Pain Scale: Hurts a little bit Pain Location: L hip - no pain at rest Pain Descriptors / Indicators: Discomfort, Dull Pain Intervention(s): Limited activity within patient's tolerance, Monitored during session, Repositioned    Home Living                          Prior Function  PT Goals (current goals can now be found in the care plan section) Progress towards PT goals: Progressing toward goals    Frequency    BID      PT Plan      Co-evaluation              AM-PAC PT "6 Clicks" Mobility   Outcome Measure  Help needed turning from your back to your side while in a flat bed without using bedrails?: None Help needed moving from lying on your back to sitting on the side of a flat bed without using bedrails?: None Help needed moving to and from a bed to a chair (including a wheelchair)?: A Little Help needed standing up from a chair using your arms (e.g.,  wheelchair or bedside chair)?: A Little Help needed to walk in hospital room?: A Little Help needed climbing 3-5 steps with a railing? : A Little 6 Click Score: 20    End of Session Equipment Utilized During Treatment: Gait belt Activity Tolerance: Patient tolerated treatment well Patient left: in chair;with call bell/phone within reach;with chair alarm set Nurse Communication: Mobility status PT Visit Diagnosis: Unsteadiness on feet (R26.81);Other abnormalities of gait and mobility (R26.89);Muscle weakness (generalized) (M62.81);Pain Pain - Right/Left: Left Pain - part of body: Hip     Time: 1610-9604 PT Time Calculation (min) (ACUTE ONLY): 19 min  Charges:    $Gait Training: 8-22 mins PT General Charges $$ ACUTE PT VISIT: 1 Visit                    Danielle Dess, PTA 10/26/22, 8:59 AM

## 2022-10-26 NOTE — Progress Notes (Signed)
DISCHARGE NOTE:  Pt, wife and son given discharge instructions and verbalized understanding. TED hose on both legs. Pt wheeled to car by staff, family providing transportation.

## 2022-10-26 NOTE — Plan of Care (Signed)
  Problem: Activity: Goal: Ability to avoid complications of mobility impairment will improve Outcome: Progressing   Problem: Pain Management: Goal: Pain level will decrease with appropriate interventions Outcome: Progressing   Problem: Skin Integrity: Goal: Will show signs of wound healing Outcome: Progressing   

## 2023-07-21 ENCOUNTER — Ambulatory Visit: Payer: Medicare Other | Admitting: Neurosurgery

## 2023-07-22 ENCOUNTER — Other Ambulatory Visit: Payer: Self-pay | Admitting: Family Medicine

## 2023-07-22 DIAGNOSIS — M5416 Radiculopathy, lumbar region: Secondary | ICD-10-CM

## 2023-07-26 ENCOUNTER — Ambulatory Visit
Admission: RE | Admit: 2023-07-26 | Discharge: 2023-07-26 | Disposition: A | Discharge status: Home / Self Care | Attending: Neurosurgery | Admitting: Neurosurgery

## 2023-07-26 ENCOUNTER — Encounter: Payer: Self-pay | Admitting: Neurosurgery

## 2023-07-26 ENCOUNTER — Ambulatory Visit
Admission: RE | Admit: 2023-07-26 | Discharge: 2023-07-26 | Disposition: A | Discharge status: Home / Self Care | Source: Ambulatory Visit | Attending: Neurosurgery | Admitting: Neurosurgery

## 2023-07-26 ENCOUNTER — Ambulatory Visit (INDEPENDENT_AMBULATORY_CARE_PROVIDER_SITE_OTHER): Payer: Medicare Other | Admitting: Neurosurgery

## 2023-07-26 VITALS — BP 138/70 | Ht 69.0 in | Wt 222.0 lb

## 2023-07-26 DIAGNOSIS — M5416 Radiculopathy, lumbar region: Secondary | ICD-10-CM

## 2023-07-26 DIAGNOSIS — Z09 Encounter for follow-up examination after completed treatment for conditions other than malignant neoplasm: Secondary | ICD-10-CM

## 2023-07-26 NOTE — Progress Notes (Signed)
   REFERRING PHYSICIAN:  Lynnea Ferrier, Md [address] [facility] [place],  [place] [address]  DOS: 10/26/21 open L4-S1 TLIF   HISTORY OF PRESENT ILLNESS: Dean Simpson is status post lumbar fusion.  He is doing very well with minimal pain.    PHYSICAL EXAMINATION:  General: Patient is well developed, well nourished, calm, collected, and in no apparent distress.   NEUROLOGICAL:  General: In no acute distress.   Awake, alert, oriented to person, place, and time.  Pupils equal round and reactive to light.  Facial tone is symmetric.  Tongue protrusion is midline.  There is no pronator drift.   Strength:            Side Iliopsoas Quads Hamstring PF DF EHL  R 5 5 5 5 5 5   L 5 5 5 5 5 5    Incision well healed   ROS (Neurologic):  Negative except as noted above  IMAGING: 07/22/22 lumbar xrays Show intact hardware in good position  X-rays today-stable  ASSESSMENT/PLAN:  Dean Simpson is doing well approximately 21 months after lumbar fusion.  I am very pleased with how well he has done.  I will see him back on an as-needed basis.   I spent a total of 10 minutes in this patient's care today. This time was spent reviewing pertinent records including imaging studies, obtaining and confirming history, performing a directed evaluation, formulating and discussing my recommendations, and documenting the visit within the medical record.    Venetia Night MD Department of neurosurgery

## 2023-12-28 ENCOUNTER — Encounter: Payer: Self-pay | Admitting: *Deleted

## 2024-01-09 ENCOUNTER — Encounter: Payer: Self-pay | Admitting: *Deleted

## 2024-01-23 ENCOUNTER — Encounter
Admission: RE | Disposition: A | Discharge status: Home / Self Care | Payer: Self-pay | Source: Home / Self Care | Attending: Gastroenterology

## 2024-01-23 ENCOUNTER — Ambulatory Visit: Admitting: Certified Registered Nurse Anesthetist

## 2024-01-23 ENCOUNTER — Ambulatory Visit
Admission: RE | Admit: 2024-01-23 | Discharge: 2024-01-23 | Disposition: A | Discharge status: Home / Self Care | Attending: Gastroenterology | Admitting: Gastroenterology

## 2024-01-23 DIAGNOSIS — I2510 Atherosclerotic heart disease of native coronary artery without angina pectoris: Secondary | ICD-10-CM | POA: Diagnosis not present

## 2024-01-23 DIAGNOSIS — Z7985 Long-term (current) use of injectable non-insulin antidiabetic drugs: Secondary | ICD-10-CM | POA: Insufficient documentation

## 2024-01-23 DIAGNOSIS — D123 Benign neoplasm of transverse colon: Secondary | ICD-10-CM | POA: Insufficient documentation

## 2024-01-23 DIAGNOSIS — Z1211 Encounter for screening for malignant neoplasm of colon: Secondary | ICD-10-CM | POA: Insufficient documentation

## 2024-01-23 DIAGNOSIS — I350 Nonrheumatic aortic (valve) stenosis: Secondary | ICD-10-CM | POA: Diagnosis not present

## 2024-01-23 DIAGNOSIS — D122 Benign neoplasm of ascending colon: Secondary | ICD-10-CM | POA: Diagnosis not present

## 2024-01-23 DIAGNOSIS — K641 Second degree hemorrhoids: Secondary | ICD-10-CM | POA: Diagnosis not present

## 2024-01-23 DIAGNOSIS — Z7984 Long term (current) use of oral hypoglycemic drugs: Secondary | ICD-10-CM | POA: Insufficient documentation

## 2024-01-23 DIAGNOSIS — Z7951 Long term (current) use of inhaled steroids: Secondary | ICD-10-CM | POA: Diagnosis not present

## 2024-01-23 DIAGNOSIS — G4733 Obstructive sleep apnea (adult) (pediatric): Secondary | ICD-10-CM | POA: Diagnosis not present

## 2024-01-23 DIAGNOSIS — Z79899 Other long term (current) drug therapy: Secondary | ICD-10-CM | POA: Diagnosis not present

## 2024-01-23 DIAGNOSIS — I10 Essential (primary) hypertension: Secondary | ICD-10-CM | POA: Insufficient documentation

## 2024-01-23 DIAGNOSIS — J449 Chronic obstructive pulmonary disease, unspecified: Secondary | ICD-10-CM | POA: Diagnosis not present

## 2024-01-23 DIAGNOSIS — E785 Hyperlipidemia, unspecified: Secondary | ICD-10-CM | POA: Insufficient documentation

## 2024-01-23 DIAGNOSIS — Z7982 Long term (current) use of aspirin: Secondary | ICD-10-CM | POA: Insufficient documentation

## 2024-01-23 HISTORY — PX: COLONOSCOPY: SHX5424

## 2024-01-23 HISTORY — PX: POLYPECTOMY: SHX149

## 2024-01-23 LAB — GLUCOSE, CAPILLARY: Glucose-Capillary: 152 mg/dL — ABNORMAL HIGH (ref 70–99)

## 2024-01-23 SURGERY — COLONOSCOPY
Anesthesia: General

## 2024-01-23 MED ORDER — PROPOFOL 500 MG/50ML IV EMUL
INTRAVENOUS | Status: DC | PRN
  Administered 2024-01-23: 150 ug/kg/min via INTRAVENOUS

## 2024-01-23 MED ORDER — SODIUM CHLORIDE 0.9 % IV SOLN: INTRAVENOUS | Status: DC

## 2024-01-23 MED ORDER — PROPOFOL 10 MG/ML IV BOLUS
INTRAVENOUS | Status: DC | PRN
  Administered 2024-01-23: 60 mg via INTRAVENOUS

## 2024-01-23 NOTE — Anesthesia Preprocedure Evaluation (Signed)
 Anesthesia Evaluation  Patient identified by MRN, date of birth, ID band Patient awake    Reviewed: Allergy & Precautions, NPO status , Patient's Chart, lab work & pertinent test results  History of Anesthesia Complications Negative for: history of anesthetic complications  Airway Mallampati: III  TM Distance: <3 FB Neck ROM: full    Dental  (+) Upper Dentures, Lower Dentures   Pulmonary neg shortness of breath, sleep apnea , COPD, former smoker   Pulmonary exam normal        Cardiovascular Exercise Tolerance: Good hypertension, (-) angina + CAD and + Peripheral Vascular Disease  Normal cardiovascular exam     Neuro/Psych negative neurological ROS  negative psych ROS   GI/Hepatic Neg liver ROS,GERD  Controlled,,  Endo/Other  diabetes, Type 2    Renal/GU negative Renal ROS  negative genitourinary   Musculoskeletal   Abdominal   Peds  Hematology negative hematology ROS (+)   Anesthesia Other Findings Past Medical History: No date: Adenomatous colon polyp No date: Amaurosis fugax No date: Anemia No date: Aortic atherosclerosis No date: Aortic stenosis     Comment:  a.) TTE 11/11/2021: mild (MPG 11; AVA 1.65); b.) TTE               01/11/2022: mild-mod (MPG 16; AVA 1.3); c.) TTE               05/147/2024: mod (MPG 16.3; AVA 0.95) No date: Arthritis No date: BPH (benign prostatic hyperplasia) No date: Cervical disc disease No date: COPD (chronic obstructive pulmonary disease) (HCC) No date: Coronary artery disease No date: DDD (degenerative disc disease), lumbar     Comment:  a.) s/p open L4-S1 TLIF 10/26/2021 11/11/2021: Diastolic dysfunction     Comment:  a.) TTE 11/11/2021: EF 65-70%, no RWMAs, mild AS, G1DD;               b.) TTE 01/11/2022: EF >55%, mild LVH, mild MAC, mild               BAE, mild RVE, triv panvalvular regurgitation, mild-mod               AS, G1DD; c.) TTE 09/03/2022: EF >55%, mild MAC,  triv               MR/TR/PR, mild AR, mild-mod AS, G1DD No date: Dizziness No date: DOE (dyspnea on exertion) No date: Frequent PVCs No date: GERD (gastroesophageal reflux disease) No date: Hemorrhoids No date: History of tobacco abuse No date: Hyperlipidemia No date: Hypertension No date: Long term current use of aspirin  No date: Microscopic hematuria 11/2021: NSVT (nonsustained ventricular tachycardia) (HCC)     Comment:  a.) noted on holter monitor in 11/2021 No date: OSA on CPAP 02/25/2014: Proteinuria No date: Psoriasis No date: PVD (peripheral vascular disease) No date: Stable angina No date: Symptomatic bradycardia No date: T2DM (type 2 diabetes mellitus) (HCC)  Past Surgical History: 10/26/2021: APPLICATION OF INTRAOPERATIVE CT SCAN; N/A     Comment:  Procedure: APPLICATION OF INTRAOPERATIVE CT SCAN;                Surgeon: Clois Fret, MD;  Location: [facility] ORS;                Service: Neurosurgery;  Laterality: N/A; No date: BACK SURGERY 09/23/2014: COLONOSCOPY WITH PROPOFOL ; N/A     Comment:  Procedure: COLONOSCOPY WITH PROPOFOL ;  Surgeon: Deward GRADE  Ora, MD;  Location: [facility] ENDOSCOPY;  Service:               Gastroenterology;  Laterality: N/A; 03/27/2018: COLONOSCOPY WITH PROPOFOL ; N/A     Comment:  Procedure: COLONOSCOPY WITH PROPOFOL ;  Surgeon: Viktoria Lamar DASEN, MD;  Location: [facility] ENDOSCOPY;  Service:               Endoscopy;  Laterality: N/A; 08/02/2022: COLONOSCOPY WITH PROPOFOL ; N/A     Comment:  Procedure: COLONOSCOPY WITH PROPOFOL ;  Surgeon:               Maryruth Ole DASEN, MD;  Location: [facility] ENDOSCOPY;                Service: Endoscopy;  Laterality: N/A; No date: JOINT REPLACEMENT No date: TOOTH EXTRACTION 10/25/2022: TOTAL HIP ARTHROPLASTY; Left     Comment:  Procedure: TOTAL HIP ARTHROPLASTY ANTERIOR APPROACH;                Surgeon: Lorelle Hussar, MD;  Location: [facility] ORS;                Service: Orthopedics;   Laterality: Left; 10/26/2021: TRANSFORAMINAL LUMBAR INTERBODY FUSION (TLIF) WITH  PEDICLE SCREW FIXATION 2 LEVEL; N/A     Comment:  Procedure: OPEN L4-S1 TRANSFORAMINAL LUMBAR INTERBODY               FUSION (TLIF);  Surgeon: Clois Fret, MD;                Location: [facility] ORS;  Service: Neurosurgery;  Laterality:               N/A;  BMI    Body Mass Index: 32.87 kg/m      Reproductive/Obstetrics negative OB ROS                              Anesthesia Physical Anesthesia Plan  ASA: 3  Anesthesia Plan: General   Post-op Pain Management:    Induction: Intravenous  PONV Risk Score and Plan: Propofol  infusion and TIVA  Airway Management Planned: Natural Airway and Nasal Cannula  Additional Equipment:   Intra-op Plan:   Post-operative Plan:   Informed Consent: I have reviewed the patients History and Physical, chart, labs and discussed the procedure including the risks, benefits and alternatives for the proposed anesthesia with the patient or authorized representative who has indicated his/her understanding and acceptance.     Dental Advisory Given  Plan Discussed with: Anesthesiologist, CRNA and Surgeon  Anesthesia Plan Comments: (Patient consented for risks of anesthesia including but not limited to:  - adverse reactions to medications - risk of airway placement if required - damage to eyes, teeth, lips or other oral mucosa - nerve damage due to positioning  - sore throat or hoarseness - Damage to heart, brain, nerves, lungs, other parts of body or loss of life  Patient voiced understanding and assent.)        Anesthesia Quick Evaluation

## 2024-01-23 NOTE — Interval H&P Note (Signed)
 History and Physical Interval Note:  01/23/2024 12:02 PM  Dean Simpson  has presented today for surgery, with the diagnosis of Z86.0101 (ICD-10-CM) - Hx of adenomatous colonic polyps.  The various methods of treatment have been discussed with the patient and family. After consideration of risks, benefits and other options for treatment, the patient has consented to  Procedure(s) with comments: COLONOSCOPY (N/A) - DM on Ozempic as a surgical intervention.  The patient's history has been reviewed, patient examined, no change in status, stable for surgery.  I have reviewed the patient's chart and labs.  Questions were answered to the patient's satisfaction.     Ole ONEIDA Schick  Ok to proceed with colonoscopy

## 2024-01-23 NOTE — Anesthesia Procedure Notes (Signed)
 Date/Time: 01/23/2024 12:00 PM  Performed by: Duwayne Craven, CRNAPre-anesthesia Checklist: Patient identified, Emergency Drugs available, Suction available, Patient being monitored and Timeout performed Patient Re-evaluated:Patient Re-evaluated prior to induction Oxygen Delivery Method: Nasal cannula Induction Type: IV induction Placement Confirmation: CO2 detector and positive ETCO2

## 2024-01-23 NOTE — H&P (Signed)
 Outpatient short stay form Pre-procedure 01/23/2024  Dean ONEIDA Schick, MD  Primary Physician: Fernande Ophelia JINNY DOUGLAS, MD  Reason for visit:  Surveillance  History of present illness:    73 y/o gentleman with history of hypertension, COPD, CAD, moderate aortic stenosis, and OSA on CPAP here for surveillance colonoscopy. Last colonoscopy in 2024 with small Ta's but fair prep. No blood thinners. No abdominal surgeries. No family history of GI malignancies.    Current Facility-Administered Medications:    0.9 %  sodium chloride  infusion, , Intravenous, Continuous, Omare Bilotta, Dean ONEIDA, MD, Last Rate: 20 mL/hr at 01/23/24 1158, Continued from Pre-op at 01/23/24 1158  Medications Prior to Admission  Medication Sig Dispense Refill Last Dose/Taking   albuterol  (VENTOLIN  HFA) 108 (90 Base) MCG/ACT inhaler Inhale into the lungs.   Past Week   amLODipine  (NORVASC ) 10 MG tablet Take 10 mg by mouth at bedtime.   01/23/2024   aspirin  EC 81 MG tablet Take 81 mg by mouth at bedtime.   Past Week   atorvastatin  (LIPITOR) 80 MG tablet Take 40 mg by mouth at bedtime.   Past Week   empagliflozin (JARDIANCE) 25 MG TABS tablet Take 1 tablet by mouth daily.   Past Week   glimepiride  (AMARYL ) 4 MG tablet Take 4 mg by mouth daily with breakfast.   Past Week   metoprolol  succinate (TOPROL -XL) 100 MG 24 hr tablet Take 100 mg by mouth every morning. Take with or immediately following a meal.   01/23/2024   acetaminophen  (TYLENOL ) 500 MG tablet Take 2 tablets (1,000 mg total) by mouth every 8 (eight) hours. 30 tablet 0    doxazosin  (CARDURA ) 4 MG tablet Take 1 tablet (4 mg total) by mouth daily. (Patient taking differently: Take 4 mg by mouth at bedtime.)      enoxaparin  (LOVENOX ) 40 MG/0.4ML injection Inject 0.4 mLs (40 mg total) into the skin daily for 14 days. 5.6 mL 0    hydrOXYzine  (ATARAX /VISTARIL ) 25 MG tablet Take 25 mg by mouth 3 (three) times daily as needed for itching.      Ipratropium-Albuterol  (COMBIVENT  RESPIMAT) 20-100 MCG/ACT AERS respimat Inhale 1 puff into the lungs every 6 (six) hours.      meclizine  (ANTIVERT ) 12.5 MG tablet Take 12.5 mg by mouth 3 (three) times daily as needed for dizziness.      metFORMIN  (GLUCOPHAGE ) 1000 MG tablet Take 1,000 mg by mouth 2 (two) times daily with a meal.      nystatin ointment (MYCOSTATIN) Apply 1 application topically as needed.      pantoprazole  (PROTONIX ) 40 MG tablet Take 40 mg by mouth daily.      potassium chloride  (KLOR-CON ) 10 MEQ tablet Take 1 tablet (10 mEq) daily starting the week before surgery (first dose 10/18/2022). Follow up with PCP for repeat labs. 7 tablet 0    Roflumilast  (DALIRESP ) 250 MCG TABS Take 1 tablet by mouth daily at 6 (six) AM.      Semaglutide, 2 MG/DOSE, 8 MG/3ML SOPN Inject 1 Dose into the skin once a week. On Sundays (Patient not taking: Reported on 01/23/2024)   Not Taking   triamcinolone cream (KENALOG) 0.5 % Apply 1 Application topically as needed.      valsartan -hydrochlorothiazide  (DIOVAN -HCT) 320-25 MG tablet Take 1 tablet by mouth every morning.      zafirlukast (ACCOLATE) 20 MG tablet Take 20 mg by mouth 2 (two) times daily.        Allergies  Allergen Reactions   Iodine Swelling  shellfish shellfish   Shellfish Allergy Itching and Swelling    NOT AT EVERY EXPOSURE     Past Medical History:  Diagnosis Date   Adenomatous colon polyp    Amaurosis fugax    Anemia    Aortic atherosclerosis    Aortic stenosis    a.) TTE 11/11/2021: mild (MPG 11; AVA 1.65); b.) TTE 01/11/2022: mild-mod (MPG 16; AVA 1.3); c.) TTE 05/147/2024: mod (MPG 16.3; AVA 0.95)   Arthritis    BPH (benign prostatic hyperplasia)    Cervical disc disease    COPD (chronic obstructive pulmonary disease) (HCC)    Coronary artery disease    DDD (degenerative disc disease), lumbar    a.) s/p open L4-S1 TLIF 10/26/2021   Diastolic dysfunction 11/11/2021   a.) TTE 11/11/2021: EF 65-70%, no RWMAs, mild AS, G1DD; b.) TTE 01/11/2022: EF  >55%, mild LVH, mild MAC, mild BAE, mild RVE, triv panvalvular regurgitation, mild-mod AS, G1DD; c.) TTE 09/03/2022: EF >55%, mild MAC, triv MR/TR/PR, mild AR, mild-mod AS, G1DD   Dizziness    DOE (dyspnea on exertion)    Frequent PVCs    GERD (gastroesophageal reflux disease)    Hemorrhoids    History of tobacco abuse    Hyperlipidemia    Hypertension    Long term current use of aspirin     Microscopic hematuria    NSVT (nonsustained ventricular tachycardia) (HCC) 11/2021   a.) noted on holter monitor in 11/2021   OSA on CPAP    Proteinuria 02/25/2014   Psoriasis    PVD (peripheral vascular disease)    Stable angina    Symptomatic bradycardia    T2DM (type 2 diabetes mellitus) (HCC)     Review of systems:  Otherwise negative.    Physical Exam  Gen: Alert, oriented. Appears stated age.  HEENT: PERRLA. Lungs: No respiratory distress CV: RRR Abd: soft, benign, no masses Ext: No edema    Planned procedures: Proceed with colonoscopy. The patient understands the nature of the planned procedure, indications, risks, alternatives and potential complications including but not limited to bleeding, infection, perforation, damage to internal organs and possible oversedation/side effects from anesthesia. The patient agrees and gives consent to proceed.  Please refer to procedure notes for findings, recommendations and patient disposition/instructions.     Dean ONEIDA Schick, MD [facility]

## 2024-01-23 NOTE — Op Note (Signed)
 [facility] Patient Name: Dean Simpson Procedure Date: 01/23/2024 11:50 AM MRN: [id number] Account #: [id number] Date of Birth: [birth year] Admit Type: Emergency Department Age: 73 Room: [facility] ENDO ROOM 3 Gender: Male Note Status: Finalized Instrument Name: Colon Scope [id number] Procedure:             Colonoscopy Indications:           Surveillance: History of adenomatous polyps,                         inadequate prep on last exam (<63yr) Providers:             Ole Schick MD, MD Medicines:             Monitored Anesthesia Care Complications:         No immediate complications. Estimated blood loss:                         Minimal. Procedure:             Pre-Anesthesia Assessment:                        - Prior to the procedure, a History and Physical was                         performed, and patient medications and allergies were                         reviewed. The patient is competent. The risks and                         benefits of the procedure and the sedation options and                         risks were discussed with the patient. All questions                         were answered and informed consent was obtained.                         Patient identification and proposed procedure were                         verified by the physician, the nurse, the                         anesthesiologist, the anesthetist and the technician                         in the endoscopy suite. Mental Status Examination:                         alert and oriented. Airway Examination: normal                         oropharyngeal airway and neck mobility. Respiratory                         Examination: clear to auscultation. CV Examination:  normal. Prophylactic Antibiotics: The patient does not                         require prophylactic antibiotics. Prior                         Anticoagulants: The patient has taken no  anticoagulant                         or antiplatelet agents. ASA Grade Assessment: III - A                         patient with severe systemic disease. After reviewing                         the risks and benefits, the patient was deemed in                         satisfactory condition to undergo the procedure. The                         anesthesia plan was to use monitored anesthesia care                         (MAC). Immediately prior to administration of                         medications, the patient was re-assessed for adequacy                         to receive sedatives. The heart rate, respiratory                         rate, oxygen saturations, blood pressure, adequacy of                         pulmonary ventilation, and response to care were                         monitored throughout the procedure. The physical                         status of the patient was re-assessed after the                         procedure.                        After obtaining informed consent, the colonoscope was                         passed under direct vision. Throughout the procedure,                         the patient's blood pressure, pulse, and oxygen                         saturations were monitored continuously. The  Colonoscope was introduced through the anus and                         advanced to the the terminal ileum, with                         identification of the appendiceal orifice and IC                         valve. The colonoscopy was performed without                         difficulty. The patient tolerated the procedure well.                         The quality of the bowel preparation was good. The                         terminal ileum, ileocecal valve, appendiceal orifice,                         and rectum were photographed. Findings:      The perianal and digital rectal examinations were normal.      The terminal ileum appeared  normal.      A 3 mm polyp was found in the ascending colon. The polyp was sessile.       The polyp was removed with a cold snare. Resection and retrieval were       complete. Estimated blood loss was minimal.      Three sessile polyps were found in the transverse colon. The polyps were       2 to 4 mm in size. These polyps were removed with a cold snare.       Resection and retrieval were complete. Estimated blood loss was minimal.      Internal hemorrhoids were found during retroflexion. The hemorrhoids       were Grade II (internal hemorrhoids that prolapse but reduce       spontaneously).      The exam was otherwise without abnormality on direct and retroflexion       views. Impression:            - The examined portion of the ileum was normal.                        - One 3 mm polyp in the ascending colon, removed with                         a cold snare. Resected and retrieved.                        - Three 2 to 4 mm polyps in the transverse colon,                         removed with a cold snare. Resected and retrieved.                        - Internal hemorrhoids.                        -  The examination was otherwise normal on direct and                         retroflexion views. Recommendation:        - Discharge patient to home.                        - Resume previous diet.                        - Continue present medications.                        - Await pathology results.                        - Repeat colonoscopy in 3 - 5 years for surveillance.                        - Return to referring physician as previously                         scheduled. Procedure Code(s):     --- Professional ---                        [phone], Colonoscopy, flexible; with removal of                         tumor(s), polyp(s), or other lesion(s) by snare                         technique Diagnosis Code(s):     --- Professional ---                        Z86.010, Personal history of  colonic polyps                        K64.1, Second degree hemorrhoids                        D12.2, Benign neoplasm of ascending colon                        D12.3, Benign neoplasm of transverse colon (hepatic                         flexure or splenic flexure) CPT copyright 2022 [employer]. All rights reserved. The codes documented in this report are preliminary and upon coder review may  be revised to meet current compliance requirements. Ole Schick MD, MD 01/23/2024 12:28:10 PM Number of Addenda: 0 Note Initiated On: 01/23/2024 11:50 AM Scope Withdrawal Time: 0 hours 10 minutes 4 seconds  Total Procedure Duration: 0 hours 12 minutes 53 seconds  Estimated Blood Loss:  Estimated blood loss was minimal.      [facility]

## 2024-01-23 NOTE — Transfer of Care (Signed)
 Immediate Anesthesia Transfer of Care Note  Patient: Dean Simpson  Procedure(s) Performed: COLONOSCOPY POLYPECTOMY, INTESTINE  Patient Location: PACU  Anesthesia Type:General  Level of Consciousness: drowsy  Airway & Oxygen Therapy: Patient Spontanous Breathing  Post-op Assessment: Report given to RN and Post -op Vital signs reviewed and stable  Post vital signs: Reviewed and stable  Last Vitals:  Vitals Value Taken Time  BP 128/44 01/23/24 12:24  Temp    Pulse 64 01/23/24 12:24  Resp 16 01/23/24 12:24  SpO2 93 % 01/23/24 12:24  Vitals shown include unfiled device data.  Last Pain:  Vitals:   01/23/24 1105  TempSrc: Temporal  PainSc: 0-No pain         Complications: No notable events documented.

## 2024-01-24 LAB — SURGICAL PATHOLOGY

## 2024-01-30 NOTE — Anesthesia Postprocedure Evaluation (Signed)
 Anesthesia Post Note  Patient: Dean Simpson  Procedure(s) Performed: COLONOSCOPY POLYPECTOMY, INTESTINE  Patient location during evaluation: PACU Anesthesia Type: General Level of consciousness: awake and alert Pain management: pain level controlled Vital Signs Assessment: post-procedure vital signs reviewed and stable Respiratory status: spontaneous breathing, nonlabored ventilation, respiratory function stable and patient connected to nasal cannula oxygen Cardiovascular status: blood pressure returned to baseline and stable Postop Assessment: no apparent nausea or vomiting Anesthetic complications: no   No notable events documented.   Last Vitals:  Vitals:   01/23/24 1234 01/23/24 1244  BP:    Pulse: 65 65  Resp:    Temp:    SpO2: 95% 95%    Last Pain:  Vitals:   01/24/24 0728  TempSrc:   PainSc: 0-No pain                 Lynwood KANDICE Clause
